# Patient Record
Sex: Male | Born: 1935 | Race: White | Hispanic: No | Smoking: Never smoker
Health system: Southern US, Community
[De-identification: ages and names within clinical notes are randomized; demographics above are authoritative.]

## PROBLEM LIST (undated history)

## (undated) DIAGNOSIS — E079 Disorder of thyroid, unspecified: Secondary | ICD-10-CM

## (undated) DIAGNOSIS — E119 Type 2 diabetes mellitus without complications: Secondary | ICD-10-CM

## (undated) DIAGNOSIS — I10 Essential (primary) hypertension: Secondary | ICD-10-CM

## (undated) HISTORY — PX: JOINT REPLACEMENT: SHX530

---

## 2020-06-08 ENCOUNTER — Other Ambulatory Visit: Payer: Self-pay | Admitting: Otolaryngology

## 2020-06-08 DIAGNOSIS — R29810 Facial weakness: Secondary | ICD-10-CM

## 2020-06-15 ENCOUNTER — Ambulatory Visit
Admission: RE | Admit: 2020-06-15 | Discharge: 2020-06-15 | Disposition: A | Discharge status: Home / Self Care | Payer: Self-pay | Source: Ambulatory Visit | Attending: Otolaryngology | Admitting: Otolaryngology

## 2020-06-15 DIAGNOSIS — R29810 Facial weakness: Secondary | ICD-10-CM

## 2020-06-15 IMAGING — CT CT TEMPORAL BONES W/ CM
2 of 7 series · 8 of 40 positions shown, 10 images · IV contrast (iopamidol)
Comparison: No pertinent prior exam.
COMPARISON: No pertinent prior exam.

Addendum:
CLINICAL DATA: Bilateral hearing loss and drainage from the ear
infection for several months. Multiple rounds of antibiotics. Right
facial paralysis.

EXAM:
CT TEMPORAL BONES WITH CONTRAST
TECHNIQUE: Axial and coronal plane CT imaging of the petrous temporal bones was
performed with thin-collimation image reconstruction following
intravenous contrast administration. Multiplanar CT image
reconstructions were also generated.
CONTRAST:  75mL [HY] IOPAMIDOL ([HY]) INJECTION 61%

[Series 5: temporal bones 0.80 hr60 cor soft · coronal · 0.16mm/px · 3 of 350 slices shown]
[im 117/350  bone]
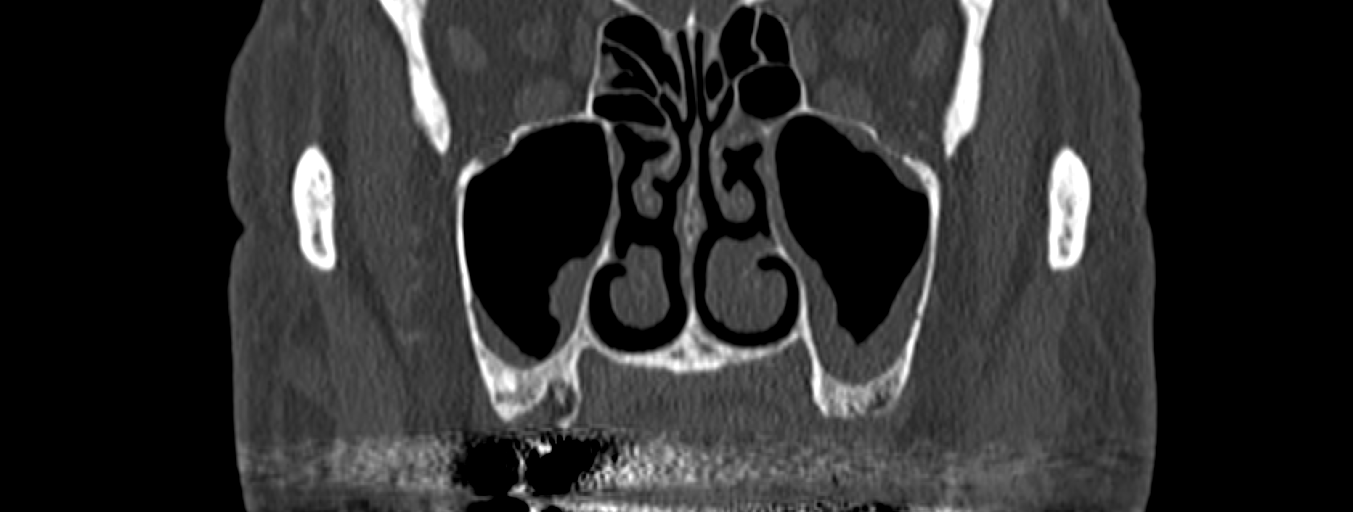
[im 156/350  bone]
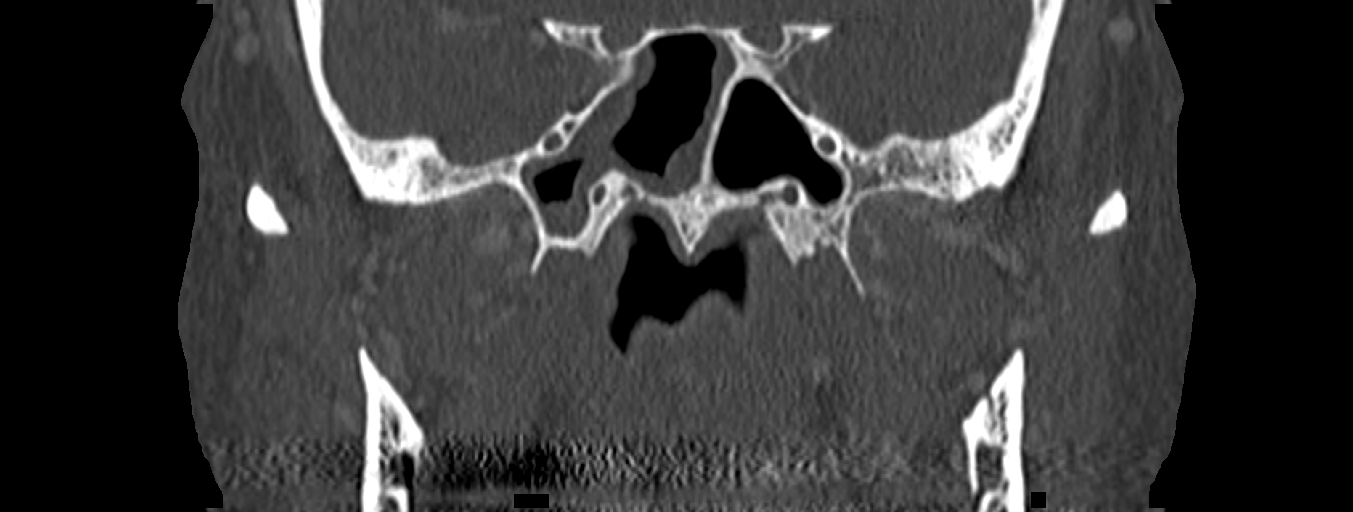
[im 194/350  bone]
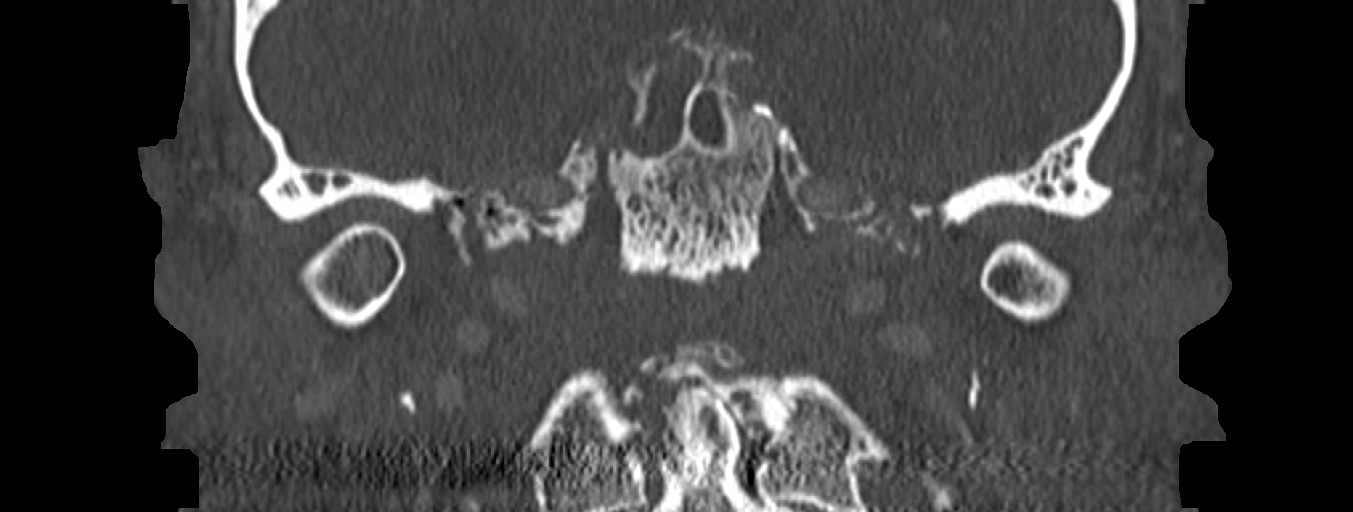

[Series 11: temporal bones 0.80 hr60 cor rt mag · axial · 0.25mm/px · z∈[-600,-535]mm · 5 of 162 slices shown, 7 images]
[im 27/162  brain]
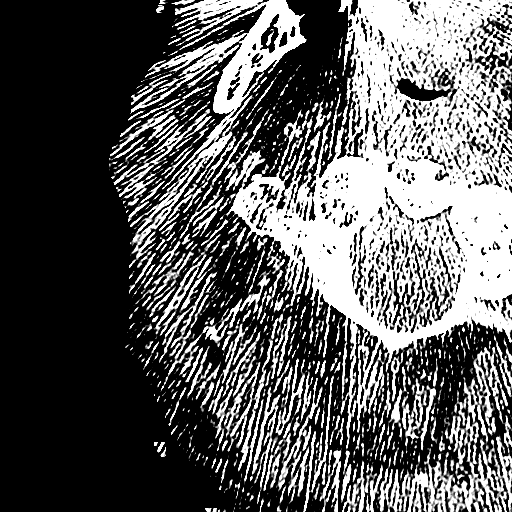
[im 27/162  bone]
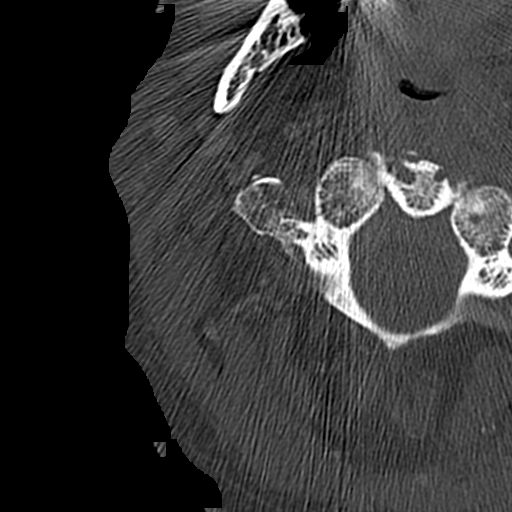
[im 54/162  bone]
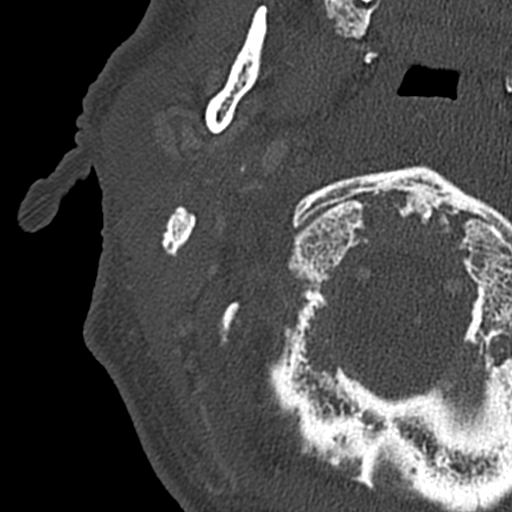
[im 81/162  bone]
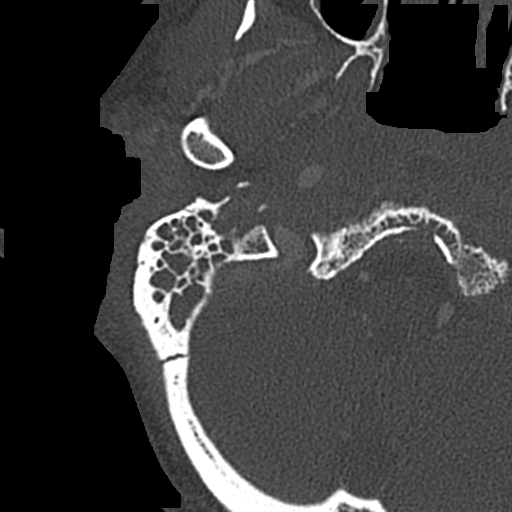
[im 108/162  bone]
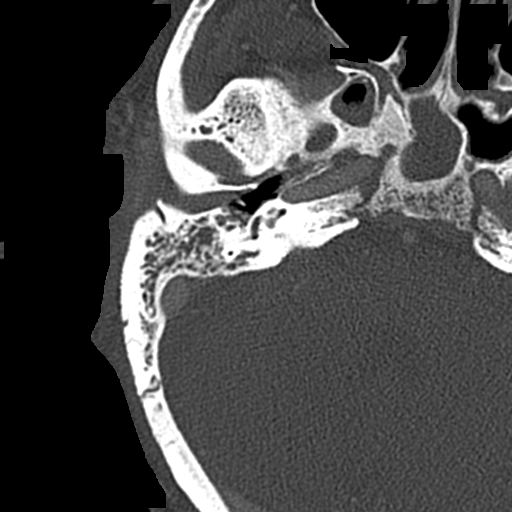
[im 135/162  brain]
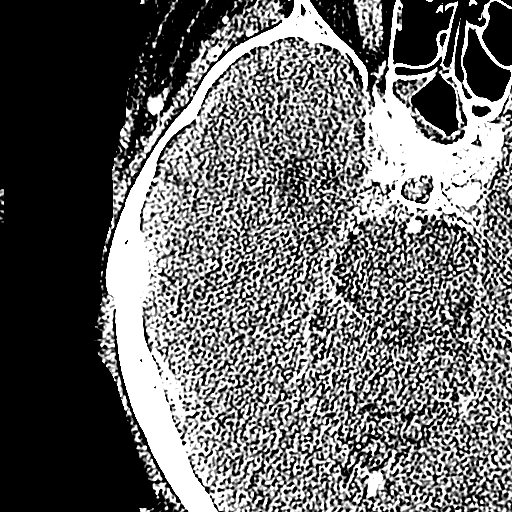
[im 135/162  bone]
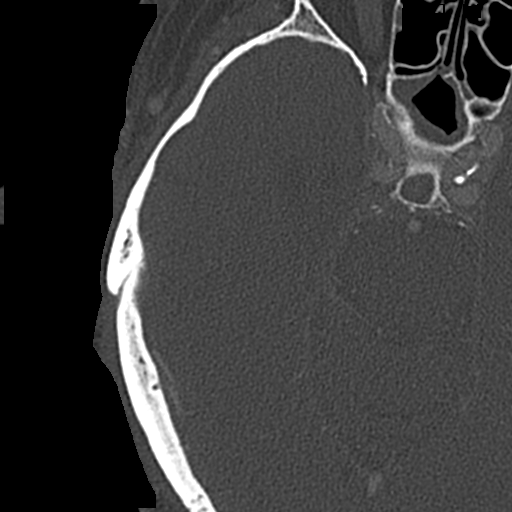

[8 of 40 positions shown; findings below may reference images not displayed]

FINDINGS: Large bilateral mastoid effusions with bilateral middle ear fluid.
There are multifocal areas of bony erosion involving the temporal
bones bilaterally including the jugular foramen region, sigmoid
plates, the region of the stylomastoid foramen bilaterally, along
the clivus, and likely the tegmen tympani bilaterally. There is
extensive surrounding soft tissue thickening and fluid which extends
anteriorly and inferiorly to involve bilateral posterior and lateral
nasopharynx and along the clivus with approximately 2.6 x 0.6 cm
per-clival fluid collection. Abnormal soft tissue also involves
bilateral carotid spaces and the jugular foramen with encasement of
the internal carotid arteries at the skull base and the jugular
veins with associated narrowing. The distal sigmoid sinuses
bilaterally are non-opacified, concerning for thrombosis. The more
proximal transverse sinuses are patent. Soft tissue thickening of
bilateral external auditory canals. The ossicles appear intact
bilaterally. Unremarkable appearance of the inner ear structures. No
obvious intracranial parenchymal abnormality on limited assessment.

On-call ENT provider paged at the time of dictation. Awaiting
callback.
IMPRESSION: 1. Large bilateral mastoid effusions and middle ear fluid with
osseous erosive change of the temporal bones and skull base with
extensive masslike soft tissue thickening involving the posterior
nasopharynx and skull base with encasement of the ICAs and jugular
veins and preclival 2.6 cm fluid collection. Constellation of
findings is concerning for severe bilateral otomastoiditis with
associated skull base osteomyelitis (bacterial or fungal) with
phlegmon and possible preclival abscess. Nasopharyngeal carcinoma is
possible, although the extent of abnormality is atypical. Recommend
biopsy. An MRI with contrast could further characterize the extent
of abnormality and better evaluate for intracranial spread of
infection if clinically indicated.
2. Likely thrombosis of the distal sigmoid dural venous sinuses
bilaterally with narrowing of the internal jugular veins at the
skull base by phlegmon. MRI with contrast could better evaluate the
dural venous sinuses as well.

ADDENDUM:
Additionally, there is suspected intracranial extension of phlegmon
with soft tissue noted along the inferior petrous temporal bones
bilaterally and possibly the dorsal aspect of the clivus.

Findings in the report and this addendum discussed with SAMUKA
via telephone at [DATE] p.m.

*** End of Addendum ***
FINDINGS: Large bilateral mastoid effusions with bilateral middle ear fluid.
There are multifocal areas of bony erosion involving the temporal
bones bilaterally including the jugular foramen region, sigmoid
plates, the region of the stylomastoid foramen bilaterally, along
the clivus, and likely the tegmen tympani bilaterally. There is
extensive surrounding soft tissue thickening and fluid which extends
anteriorly and inferiorly to involve bilateral posterior and lateral
nasopharynx and along the clivus with approximately 2.6 x 0.6 cm
per-clival fluid collection. Abnormal soft tissue also involves
bilateral carotid spaces and the jugular foramen with encasement of
the internal carotid arteries at the skull base and the jugular
veins with associated narrowing. The distal sigmoid sinuses
bilaterally are non-opacified, concerning for thrombosis. The more
proximal transverse sinuses are patent. Soft tissue thickening of
bilateral external auditory canals. The ossicles appear intact
bilaterally. Unremarkable appearance of the inner ear structures. No
obvious intracranial parenchymal abnormality on limited assessment.

On-call ENT provider paged at the time of dictation. Awaiting
callback.
IMPRESSION: 1. Large bilateral mastoid effusions and middle ear fluid with
osseous erosive change of the temporal bones and skull base with
extensive masslike soft tissue thickening involving the posterior
nasopharynx and skull base with encasement of the ICAs and jugular
veins and preclival 2.6 cm fluid collection. Constellation of
findings is concerning for severe bilateral otomastoiditis with
associated skull base osteomyelitis (bacterial or fungal) with
phlegmon and possible preclival abscess. Nasopharyngeal carcinoma is
possible, although the extent of abnormality is atypical. Recommend
biopsy. An MRI with contrast could further characterize the extent
of abnormality and better evaluate for intracranial spread of
infection if clinically indicated.
2. Likely thrombosis of the distal sigmoid dural venous sinuses
bilaterally with narrowing of the internal jugular veins at the
skull base by phlegmon. MRI with contrast could better evaluate the
dural venous sinuses as well.

## 2020-06-15 MED ORDER — IOPAMIDOL (ISOVUE-300) INJECTION 61%
75.0000 mL | Freq: Once | INTRAVENOUS | Status: AC | PRN
  Administered 2020-06-15: 75 mL via INTRAVENOUS

## 2020-06-18 ENCOUNTER — Inpatient Hospital Stay (HOSPITAL_COMMUNITY)
Admission: EM | Admit: 2020-06-18 | Discharge: 2020-06-24 | DRG: 638 | Disposition: A | Discharge status: Home Health | Payer: Medicare Other | Attending: Internal Medicine | Admitting: Internal Medicine

## 2020-06-18 ENCOUNTER — Encounter (HOSPITAL_COMMUNITY): Payer: Self-pay | Admitting: Emergency Medicine

## 2020-06-18 ENCOUNTER — Other Ambulatory Visit: Payer: Self-pay

## 2020-06-18 DIAGNOSIS — I10 Essential (primary) hypertension: Secondary | ICD-10-CM | POA: Diagnosis present

## 2020-06-18 DIAGNOSIS — R0902 Hypoxemia: Secondary | ICD-10-CM

## 2020-06-18 DIAGNOSIS — G510 Bell's palsy: Secondary | ICD-10-CM | POA: Diagnosis present

## 2020-06-18 DIAGNOSIS — D649 Anemia, unspecified: Secondary | ICD-10-CM

## 2020-06-18 DIAGNOSIS — B965 Pseudomonas (aeruginosa) (mallei) (pseudomallei) as the cause of diseases classified elsewhere: Secondary | ICD-10-CM | POA: Diagnosis present

## 2020-06-18 DIAGNOSIS — A498 Other bacterial infections of unspecified site: Secondary | ICD-10-CM

## 2020-06-18 DIAGNOSIS — Z96653 Presence of artificial knee joint, bilateral: Secondary | ICD-10-CM | POA: Diagnosis present

## 2020-06-18 DIAGNOSIS — Z79899 Other long term (current) drug therapy: Secondary | ICD-10-CM

## 2020-06-18 DIAGNOSIS — M542 Cervicalgia: Secondary | ICD-10-CM | POA: Diagnosis not present

## 2020-06-18 DIAGNOSIS — G08 Intracranial and intraspinal phlebitis and thrombophlebitis: Secondary | ICD-10-CM | POA: Diagnosis present

## 2020-06-18 DIAGNOSIS — Z96643 Presence of artificial hip joint, bilateral: Secondary | ICD-10-CM | POA: Diagnosis present

## 2020-06-18 DIAGNOSIS — Z7982 Long term (current) use of aspirin: Secondary | ICD-10-CM | POA: Diagnosis not present

## 2020-06-18 DIAGNOSIS — E039 Hypothyroidism, unspecified: Secondary | ICD-10-CM | POA: Diagnosis present

## 2020-06-18 DIAGNOSIS — M1990 Unspecified osteoarthritis, unspecified site: Secondary | ICD-10-CM | POA: Diagnosis present

## 2020-06-18 DIAGNOSIS — Z974 Presence of external hearing-aid: Secondary | ICD-10-CM

## 2020-06-18 DIAGNOSIS — Z7189 Other specified counseling: Secondary | ICD-10-CM | POA: Diagnosis not present

## 2020-06-18 DIAGNOSIS — Z79890 Hormone replacement therapy (postmenopausal): Secondary | ICD-10-CM

## 2020-06-18 DIAGNOSIS — I160 Hypertensive urgency: Secondary | ICD-10-CM

## 2020-06-18 DIAGNOSIS — H7093 Unspecified mastoiditis, bilateral: Secondary | ICD-10-CM | POA: Diagnosis not present

## 2020-06-18 DIAGNOSIS — R627 Adult failure to thrive: Secondary | ICD-10-CM | POA: Diagnosis present

## 2020-06-18 DIAGNOSIS — M869 Osteomyelitis, unspecified: Secondary | ICD-10-CM

## 2020-06-18 DIAGNOSIS — Z7984 Long term (current) use of oral hypoglycemic drugs: Secondary | ICD-10-CM

## 2020-06-18 DIAGNOSIS — M8668 Other chronic osteomyelitis, other site: Secondary | ICD-10-CM | POA: Diagnosis present

## 2020-06-18 DIAGNOSIS — M009 Pyogenic arthritis, unspecified: Secondary | ICD-10-CM | POA: Diagnosis present

## 2020-06-18 DIAGNOSIS — E222 Syndrome of inappropriate secretion of antidiuretic hormone: Secondary | ICD-10-CM | POA: Diagnosis present

## 2020-06-18 DIAGNOSIS — H9193 Unspecified hearing loss, bilateral: Secondary | ICD-10-CM | POA: Diagnosis present

## 2020-06-18 DIAGNOSIS — H6063 Unspecified chronic otitis externa, bilateral: Secondary | ICD-10-CM | POA: Diagnosis present

## 2020-06-18 DIAGNOSIS — Z1623 Resistance to quinolones and fluoroquinolones: Secondary | ICD-10-CM | POA: Diagnosis present

## 2020-06-18 DIAGNOSIS — E785 Hyperlipidemia, unspecified: Secondary | ICD-10-CM | POA: Diagnosis present

## 2020-06-18 DIAGNOSIS — Z6822 Body mass index (BMI) 22.0-22.9, adult: Secondary | ICD-10-CM

## 2020-06-18 DIAGNOSIS — H7013 Chronic mastoiditis, bilateral: Secondary | ICD-10-CM | POA: Diagnosis present

## 2020-06-18 DIAGNOSIS — E1169 Type 2 diabetes mellitus with other specified complication: Secondary | ICD-10-CM | POA: Diagnosis present

## 2020-06-18 DIAGNOSIS — D638 Anemia in other chronic diseases classified elsewhere: Secondary | ICD-10-CM | POA: Diagnosis present

## 2020-06-18 DIAGNOSIS — Z515 Encounter for palliative care: Secondary | ICD-10-CM | POA: Diagnosis not present

## 2020-06-18 DIAGNOSIS — G4733 Obstructive sleep apnea (adult) (pediatric): Secondary | ICD-10-CM | POA: Diagnosis present

## 2020-06-18 DIAGNOSIS — H7090 Unspecified mastoiditis, unspecified ear: Secondary | ICD-10-CM | POA: Diagnosis present

## 2020-06-18 DIAGNOSIS — Z20822 Contact with and (suspected) exposure to covid-19: Secondary | ICD-10-CM | POA: Diagnosis present

## 2020-06-18 DIAGNOSIS — Z794 Long term (current) use of insulin: Secondary | ICD-10-CM

## 2020-06-18 DIAGNOSIS — E119 Type 2 diabetes mellitus without complications: Secondary | ICD-10-CM

## 2020-06-18 HISTORY — DX: Disorder of thyroid, unspecified: E07.9

## 2020-06-18 HISTORY — DX: Type 2 diabetes mellitus without complications: E11.9

## 2020-06-18 HISTORY — DX: Essential (primary) hypertension: I10

## 2020-06-18 LAB — COMPREHENSIVE METABOLIC PANEL
ALT: 14 U/L (ref 0–44)
AST: 16 U/L (ref 15–41)
Albumin: 2.3 g/dL — ABNORMAL LOW (ref 3.5–5.0)
Alkaline Phosphatase: 75 U/L (ref 38–126)
Anion gap: 7 (ref 5–15)
BUN: 19 mg/dL (ref 8–23)
CO2: 31 mmol/L (ref 22–32)
Calcium: 8.5 mg/dL — ABNORMAL LOW (ref 8.9–10.3)
Chloride: 91 mmol/L — ABNORMAL LOW (ref 98–111)
Creatinine, Ser: 0.79 mg/dL (ref 0.61–1.24)
GFR, Estimated: >60 mL/min (ref >60)
Glucose, Bld: 204 mg/dL — ABNORMAL HIGH (ref 70–99)
Potassium: 4.2 mmol/L (ref 3.5–5.1)
Sodium: 129 mmol/L — ABNORMAL LOW (ref 135–145)
Total Bilirubin: 0.6 mg/dL (ref 0.3–1.2)
Total Protein: 5.8 g/dL — ABNORMAL LOW (ref 6.5–8.1)

## 2020-06-18 LAB — CBG MONITORING, ED: Glucose-Capillary: 174 mg/dL — ABNORMAL HIGH (ref 70–99)

## 2020-06-18 LAB — CBC WITH DIFFERENTIAL/PLATELET
Abs Immature Granulocytes: 0.04 10*3/uL (ref 0.00–0.07)
Basophils Absolute: 0 10*3/uL (ref 0.0–0.1)
Basophils Relative: 0 %
Eosinophils Absolute: 0.3 10*3/uL (ref 0.0–0.5)
Eosinophils Relative: 3 %
HCT: 31.4 % — ABNORMAL LOW (ref 39.0–52.0)
Hemoglobin: 10.4 g/dL — ABNORMAL LOW (ref 13.0–17.0)
Immature Granulocytes: 1 %
Lymphocytes Relative: 11 %
Lymphs Abs: 0.9 10*3/uL (ref 0.7–4.0)
MCH: 29.9 pg (ref 26.0–34.0)
MCHC: 33.1 g/dL (ref 30.0–36.0)
MCV: 90.2 fL (ref 80.0–100.0)
Monocytes Absolute: 0.6 10*3/uL (ref 0.1–1.0)
Monocytes Relative: 7 %
Neutro Abs: 6 10*3/uL (ref 1.7–7.7)
Neutrophils Relative %: 78 %
Platelets: 484 10*3/uL — ABNORMAL HIGH (ref 150–400)
RBC: 3.48 MIL/uL — ABNORMAL LOW (ref 4.22–5.81)
RDW: 13.4 % (ref 11.5–15.5)
WBC: 7.7 10*3/uL (ref 4.0–10.5)
nRBC: 0 % (ref 0.0–0.2)

## 2020-06-18 LAB — RESP PANEL BY RT-PCR (FLU A&B, COVID) ARPGX2
Influenza A by PCR: NEGATIVE
Influenza B by PCR: NEGATIVE
SARS Coronavirus 2 by RT PCR: NEGATIVE

## 2020-06-18 LAB — LACTIC ACID, PLASMA: Lactic Acid, Venous: 1.1 mmol/L (ref 0.5–1.9)

## 2020-06-18 MED ORDER — SODIUM CHLORIDE 0.9 % IV SOLN
2.0000 g | Freq: Three times a day (TID) | INTRAVENOUS | Status: DC
  Administered 2020-06-18 – 2020-06-24 (×16): 2 g via INTRAVENOUS
  Filled 2020-06-18 (×17): qty 2

## 2020-06-18 NOTE — ED Provider Notes (Signed)
Emergency Medicine Provider Triage Evaluation Note  Nicolas James , a 87 y.o. male  was evaluated in triage.  Pt complains of osteomyelitis. Hx provided by daughter at bedside. She states pt has had chronic otitis externa. He recently f/u with ent here in [place] and had ct temporal bones which showed otomastoiditis with skull base osteomyelitis and possible preclival abscess. He also likely has thrombosis of the distal sigmoid dural venous sinuses bilaterally.    Review of Systems  Positive: Facial droop (chronic, unchanged), ear drainage Negative: fever  Physical Exam  BP (!) 152/76 (BP Location: Right Arm)   Pulse (!) 59   Temp 98 F (36.7 C)   Resp 14   SpO2 100%  Gen:   Awake, no distress  Resp:  Normal effort  MSK:   Moves extremities without difficulty    Medical Decision Making  Medically screening exam initiated at 8:43 PM.  Appropriate orders placed.  Nicolas James was informed that the remainder of the evaluation will be completed by another provider, this initial triage assessment does not replace that evaluation, and the importance of remaining in the ED until their evaluation is complete.     Nicolas James 06/18/20 2048    Tegeler, Canary Brim, MD 06/18/20 2258

## 2020-06-18 NOTE — ED Provider Notes (Signed)
[facility] EMERGENCY DEPARTMENT Provider Note   CSN: [id number] Arrival date & time: 06/18/20  2019     History Chief Complaint  Patient presents with  . Osteomyelitis     Graylen Noboa is a 87 y.o. male.  HPI   Patient has a history of chronic otitis externa that has been ongoing since August of last year.  Patient has been having copious drainage from his ears.  He has had worsening symptoms that has resulted in loss of hearing.  He also has developed a facial palsy.  Patient has been followed by [facility].  He had a CT scan on April 27 of the temporal bones.  This was performed with contrast.  The findings showed large bilateral mastoid effusions and middle ear fluid with erosive changes of the temporal bones and skull base.  There was extensive masslike soft tissue thickening involving the posterior nasopharynx and skull base with encasement of the internal carotid arteries and jugular veins.  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 Diabetes mellitus without complication (HCC)   . Hypertension   . Thyroid disease     There are no problems to display for this patient.   Past Surgical History:  Procedure Laterality Date  . JOINT REPLACEMENT Bilateral    Hips and Knees       No family history on file.  Social History   Tobacco Use  . Smoking status: Never Smoker  . Smokeless tobacco: Never Used  Substance Use Topics  . Alcohol use: Never  . Drug use: Never    Home Medications Prior to Admission medications   Not on File    Allergies    Patient has no known allergies.  Review of Systems   Review of Systems  All other systems reviewed and are negative.   Physical Exam Updated Vital Signs BP (!) 179/80   Pulse (!) 57   Temp 98 F (36.7 C)   Resp 17   SpO2 100%   Physical Exam Vitals and nursing note reviewed.   Constitutional:      Appearance: He is well-developed. He is not diaphoretic.  HENT:     Head: Normocephalic and atraumatic.     Ears:     Comments: Cotton swabs bilateral external auditory canals Eyes:     General: No scleral icterus.       Right eye: No discharge.        Left eye: No discharge.     Conjunctiva/sclera: Conjunctivae normal.  Neck:     Trachea: No tracheal deviation.  Cardiovascular:     Rate and Rhythm: Normal rate and regular rhythm.  Pulmonary:     Effort: Pulmonary effort is normal. No respiratory distress.     Breath sounds: Normal breath sounds. No stridor. No wheezing or rales.  Abdominal:     General: Bowel sounds are normal. There is no distension.     Palpations: Abdomen is soft.     Tenderness: There is no abdominal tenderness. There is no guarding or rebound.  Musculoskeletal:        General: No tenderness.     Cervical back: Neck supple.  Skin:    General: Skin is warm and dry.     Findings: No rash.  Neurological:     Mental Status: He is alert.     Cranial Nerves: No cranial nerve deficit (no facial droop, extraocular movements  intact, no slurred speech).     Sensory: No sensory deficit.     Motor: No abnormal muscle tone or seizure activity.     Coordination: Coordination normal.     Comments: Right facial palsy     ED Results / Procedures / Treatments   Labs (all labs ordered are listed, but only abnormal results are displayed) Labs Reviewed  COMPREHENSIVE METABOLIC PANEL - Abnormal; Notable for the following components:      Result Value   Sodium 129 (*)    Chloride 91 (*)    Glucose, Bld 204 (*)    Calcium 8.5 (*)    Total Protein 5.8 (*)    Albumin 2.3 (*)    All other components within normal limits  CBC WITH DIFFERENTIAL/PLATELET - Abnormal; Notable for the following components:   RBC 3.48 (*)    Hemoglobin 10.4 (*)    HCT 31.4 (*)    Platelets 484 (*)    All other components within normal limits  CBG MONITORING, ED -  Abnormal; Notable for the following components:   Glucose-Capillary 174 (*)    All other components within normal limits  CULTURE, BLOOD (ROUTINE X 2)  CULTURE, BLOOD (ROUTINE X 2)  RESP PANEL BY RT-PCR (FLU A&B, COVID) ARPGX2  LACTIC ACID, PLASMA    EKG EKG Interpretation  Date/Time:  Monday Jun 18 2020 22:21:41 EDT Ventricular Rate:  55 PR Interval:  188 QRS Duration: 103 QT Interval:  500 QTC Calculation: 479 R Axis:   52 Text Interpretation: Sinus rhythm Borderline T wave abnormalities Borderline prolonged QT interval No old tracing to compare Confirmed by Linwood Dibbles [phone]) on 06/18/2020 10:43:44 PM   Radiology No results found.  Procedures Procedures   Medications Ordered in ED Medications  ceFEPIme (MAXIPIME) 2 g in sodium chloride 0.9 % 100 mL IVPB (2 g Intravenous New Bag/Given 06/18/20 2246)    ED Course  I have reviewed the triage vital signs and the nursing notes.  Pertinent labs & imaging results that were available during my care of the patient were reviewed by me and considered in my medical decision making (see chart for details).  Clinical Course as of 06/18/20 2250  Mon Jun 18, 2020  2235 Case discussed with Dr Marene Lenz.  Will consult on patient.  Recommend admission for IV abx.  [JK]  2235 DIscussed with Dr Ilsa Iha.  Will start on cefipime [JK]  2250 Labs reviewed.  Normal CBC.  Lactic acid level not elevated. [JK]  2250   Metabolic panel notable for mild hyponatremia [JK]    Clinical Course User Index [JK] Linwood Dibbles, MD   MDM Rules/Calculators/A&P                          Patient presents to the ED for further treatment of recent abnormal temporal bone CT.  Patient has bilateral mastoid effusions as well as erosive changes in the temporal bone and skull.  Patient has masslike soft tissue thickening involving the posterior nasopharynx and skull base with encasement of the internal carotid arteries and jugular veins as well as fluid collections.   Patient has evidence of phlegmon and possible precarinal abscess.  I discussed with Dr. Darl Pikes.   She will be consulting with Dr. Annalee Genta who initially saw the patient.  They will plan on biopsy at some point.  Discussed case with Dr. Marcha Dutton infectious disease and she recommends cefepime right now.  I will consult the medical service  for admission to continue IV antibiotic treatment  Final Clinical Impression(s) / ED Diagnoses Final diagnoses:  Osteomyelitis, unspecified site, unspecified type (HCC)  Mastoiditis, unspecified laterality      Linwood Dibbles, MD 06/18/20 2250

## 2020-06-18 NOTE — ED Triage Notes (Signed)
Patient with chronic ear infections bilaterally.  Patient does have facial paralysis and droop on the right. Patient has ear and eye drainage.  He had a CT on Friday which stated he had possible mass at back of nasal passages.  Equal grips and equal pedal pushes.  Patient having dizziness.

## 2020-06-18 NOTE — Progress Notes (Signed)
Pharmacy Antibiotic Note  Nicolas James is a 87 y.o. male admitted on 06/18/2020 presenting with otomastoiditis with osteomyelitis and possible abscess.  Pharmacy has been consulted for cefepime dosing.  Plan: Cefepime 2g IV every 8 hours Monitor renal function, clinical progression and ID recs     Temp (24hrs), Avg:98 F (36.7 C), Min:98 F (36.7 C), Max:98 F (36.7 C)  Recent Labs  Lab 06/18/20 2047 06/18/20 2147  WBC 7.7  --   CREATININE 0.79  --   LATICACIDVEN  --  1.1    CrCl cannot be calculated (Unknown ideal weight.).    No Known Allergies  Daylene Posey, PharmD Clinical Pharmacist ED Pharmacist Phone # [phone] 06/18/2020 10:33 PM

## 2020-06-19 ENCOUNTER — Inpatient Hospital Stay (HOSPITAL_COMMUNITY): Payer: Medicare Other

## 2020-06-19 ENCOUNTER — Encounter (HOSPITAL_COMMUNITY): Payer: Self-pay | Admitting: Internal Medicine

## 2020-06-19 DIAGNOSIS — D649 Anemia, unspecified: Secondary | ICD-10-CM

## 2020-06-19 DIAGNOSIS — A498 Other bacterial infections of unspecified site: Secondary | ICD-10-CM

## 2020-06-19 DIAGNOSIS — I160 Hypertensive urgency: Secondary | ICD-10-CM

## 2020-06-19 DIAGNOSIS — H7093 Unspecified mastoiditis, bilateral: Secondary | ICD-10-CM

## 2020-06-19 DIAGNOSIS — E119 Type 2 diabetes mellitus without complications: Secondary | ICD-10-CM

## 2020-06-19 DIAGNOSIS — M869 Osteomyelitis, unspecified: Secondary | ICD-10-CM

## 2020-06-19 LAB — BASIC METABOLIC PANEL
Anion gap: 12 (ref 5–15)
BUN: 17 mg/dL (ref 8–23)
CO2: 25 mmol/L (ref 22–32)
Calcium: 8.4 mg/dL — ABNORMAL LOW (ref 8.9–10.3)
Chloride: 91 mmol/L — ABNORMAL LOW (ref 98–111)
Creatinine, Ser: 0.7 mg/dL (ref 0.61–1.24)
GFR, Estimated: >60 mL/min (ref >60)
Glucose, Bld: 223 mg/dL — ABNORMAL HIGH (ref 70–99)
Potassium: 3.9 mmol/L (ref 3.5–5.1)
Sodium: 128 mmol/L — ABNORMAL LOW (ref 135–145)

## 2020-06-19 LAB — GLUCOSE, CAPILLARY
Glucose-Capillary: 99 mg/dL (ref 70–99)
Glucose-Capillary: 148 mg/dL — ABNORMAL HIGH (ref 70–99)
Glucose-Capillary: 122 mg/dL — ABNORMAL HIGH (ref 70–99)
Glucose-Capillary: 107 mg/dL — ABNORMAL HIGH (ref 70–99)

## 2020-06-19 LAB — CBC
HCT: 35.2 % — ABNORMAL LOW (ref 39.0–52.0)
Hemoglobin: 11.2 g/dL — ABNORMAL LOW (ref 13.0–17.0)
MCH: 29.3 pg (ref 26.0–34.0)
MCHC: 31.8 g/dL (ref 30.0–36.0)
MCV: 92.1 fL (ref 80.0–100.0)
Platelets: 489 10*3/uL — ABNORMAL HIGH (ref 150–400)
RBC: 3.82 MIL/uL — ABNORMAL LOW (ref 4.22–5.81)
RDW: 13.4 % (ref 11.5–15.5)
WBC: 8.9 10*3/uL (ref 4.0–10.5)
nRBC: 0 % (ref 0.0–0.2)

## 2020-06-19 LAB — D-DIMER, QUANTITATIVE: D-Dimer, Quant: 2.56 ug/mL-FEU — ABNORMAL HIGH (ref 0.00–0.50)

## 2020-06-19 LAB — FOLATE: Folate: 22 ng/mL (ref >5.9)

## 2020-06-19 LAB — CBG MONITORING, ED
Glucose-Capillary: 224 mg/dL — ABNORMAL HIGH (ref 70–99)
Glucose-Capillary: 165 mg/dL — ABNORMAL HIGH (ref 70–99)

## 2020-06-19 LAB — IRON AND TIBC
Iron: 35 ug/dL — ABNORMAL LOW (ref 45–182)
Saturation Ratios: 14 % — ABNORMAL LOW (ref 17.9–39.5)
TIBC: 253 ug/dL (ref 250–450)
UIBC: 218 ug/dL

## 2020-06-19 LAB — SEDIMENTATION RATE: Sed Rate: 73 mm/hr — ABNORMAL HIGH (ref 0–16)

## 2020-06-19 LAB — RETICULOCYTES
Immature Retic Fract: 26.7 % — ABNORMAL HIGH (ref 2.3–15.9)
RBC.: 3.79 MIL/uL — ABNORMAL LOW (ref 4.22–5.81)
Retic Count, Absolute: 111.4 10*3/uL (ref 19.0–186.0)
Retic Ct Pct: 2.9 % (ref 0.4–3.1)

## 2020-06-19 LAB — MAGNESIUM: Magnesium: 1.4 mg/dL — ABNORMAL LOW (ref 1.7–2.4)

## 2020-06-19 LAB — FERRITIN: Ferritin: 201 ng/mL (ref 24–336)

## 2020-06-19 LAB — VITAMIN B12: Vitamin B-12: 422 pg/mL (ref 180–914)

## 2020-06-19 LAB — C-REACTIVE PROTEIN: CRP: 6.4 mg/dL — ABNORMAL HIGH (ref <1.0)

## 2020-06-19 IMAGING — MR MR HEAD WO/W CM
12 of 24 series · 26 of 48 positions shown · IV contrast (Gadavist)
Comparison: Recent temporal bone CT from [DATE].

CLINICAL DATA: Initial evaluation for acute otitis media with skull
base infection.

EXAM:
MRI HEAD WITHOUT AND WITH CONTRAST
TECHNIQUE: Multiplanar, multiecho pulse sequences of the brain and surrounding
structures were obtained without and with intravenous contrast.
CONTRAST:  8mL GADAVIST GADOBUTROL 1 MMOL/ML IV SOLN

[Series 5: DWI · axial · 3.0mm · 0.88mm/px · z∈[-71,+68]mm · 5 of 96 slices shown (1 of 4)]
[im 1/96]
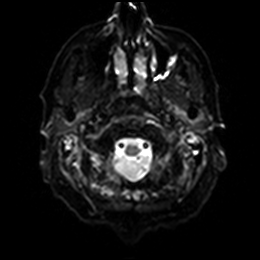
[im 24/96]
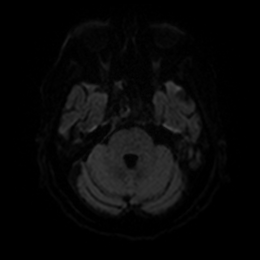
[im 48/96]
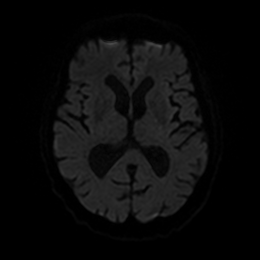
[im 72/96]
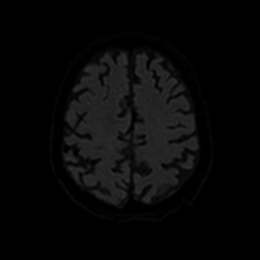
[im 96/96]
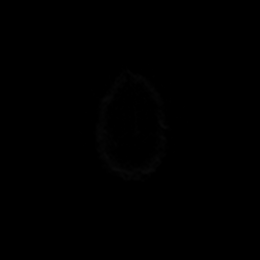

[Series 6: DWI · axial · 3.0mm · 0.88mm/px · z∈[-71,+68]mm · 3 of 48 slices shown (2 of 4)]
[im 1/48]
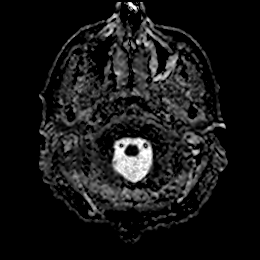
[im 24/48]
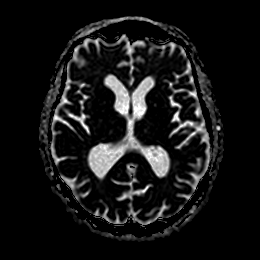
[im 48/48]
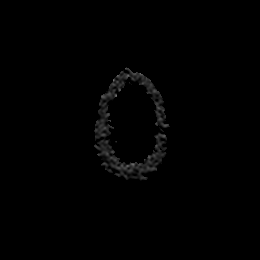

[Series 7: DWI · coronal · 4.0mm · 0.88mm/px · 3 of 64 slices shown (3 of 4)]
[im 1/64]
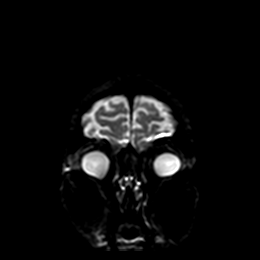
[im 32/64]
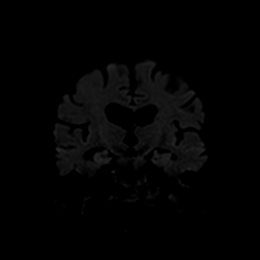
[im 64/64]
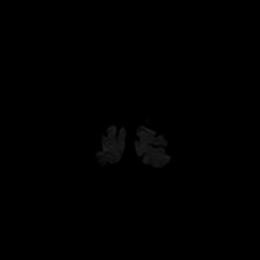

[Series 8: DWI · coronal · 4.0mm · 0.88mm/px · 2 of 32 slices shown (4 of 4)]
[im 1/32]
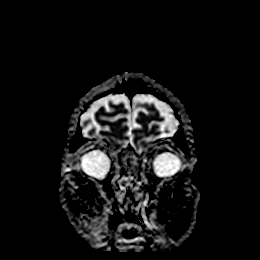
[im 32/32]
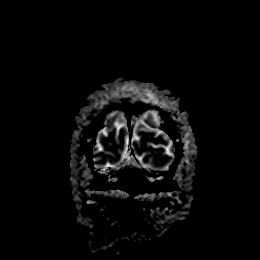

[Series 9: T1 · sagittal · 5.0mm · 0.75mm/px · 1 of 23 slices shown]
[im 1/23]
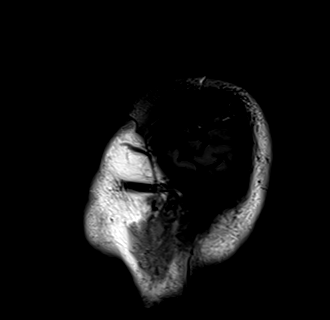

[Series 10: T2 · axial · 5.0mm · 0.72mm/px · 1 of 25 slices shown (1 of 2)]
[im 1/25]
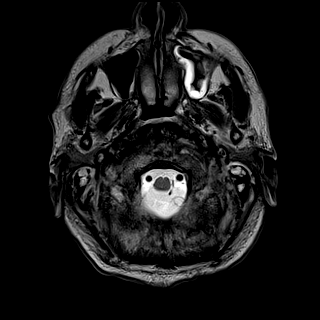

[Series 11: FLAIR · axial · 5.0mm · 0.45mm/px · 1 of 25 slices shown]
[im 1/25]
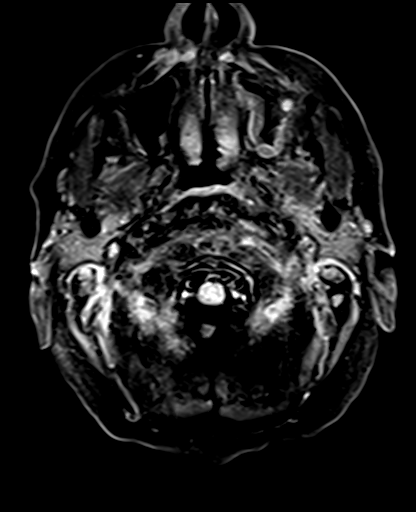

[Series 17: T2 · coronal · 5.0mm · 0.34mm/px · 2 of 29 slices shown (2 of 2)]
[im 1/29]
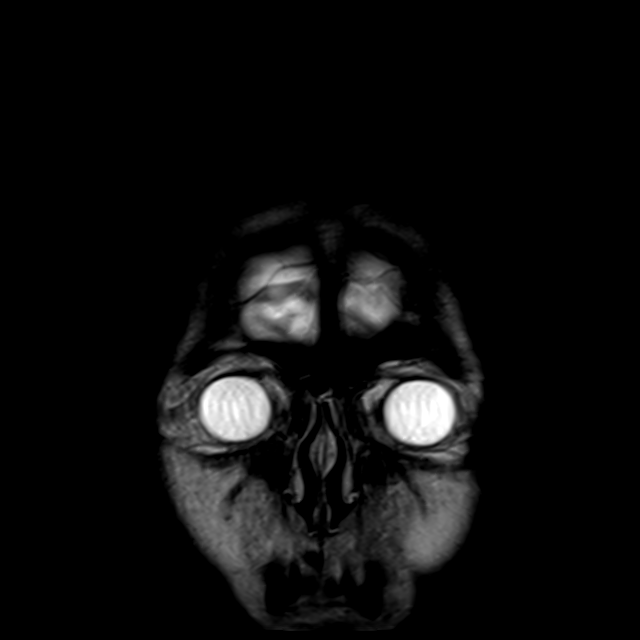
[im 29/29]
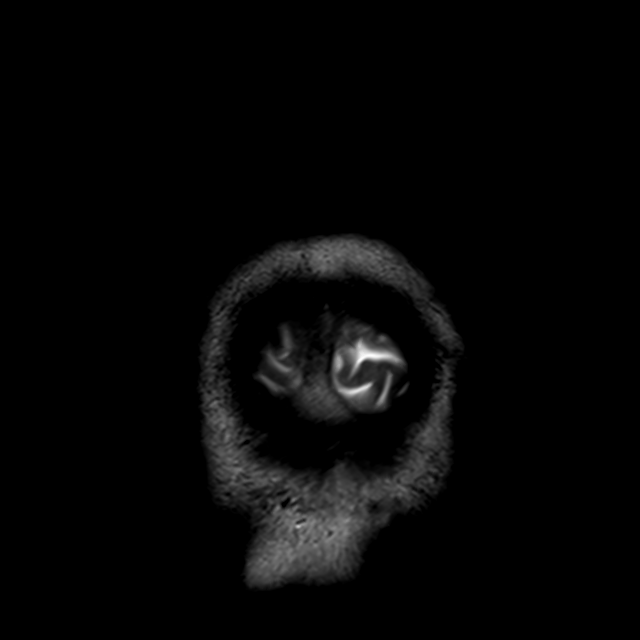

[Series 24: T2 post-contrast · coronal · 5.0mm · 0.72mm/px · 2 of 28 slices shown]
[im 1/28]
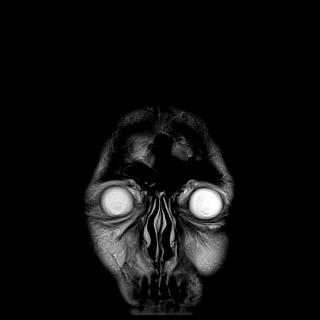
[im 28/28]
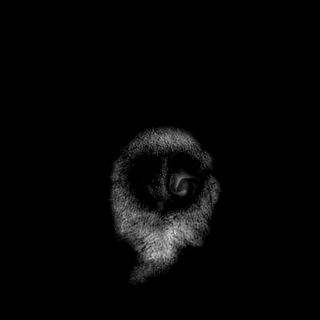

[Series 28: T1 post-contrast · coronal · 5.0mm · 0.34mm/px · 2 of 28 slices shown (1 of 3)]
[im 1/28]
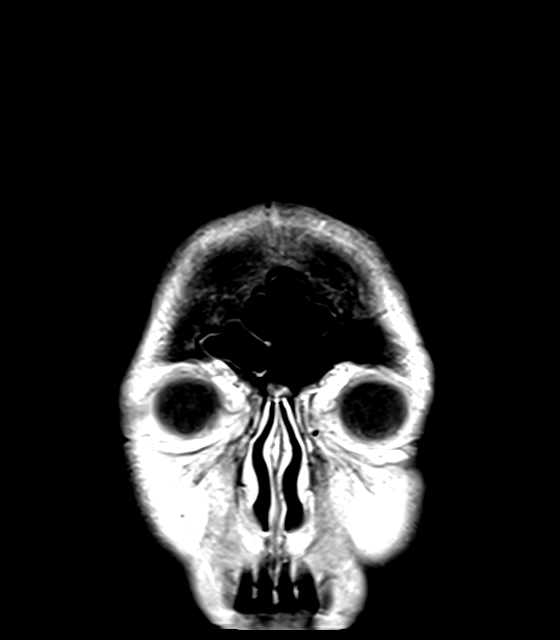
[im 28/28]
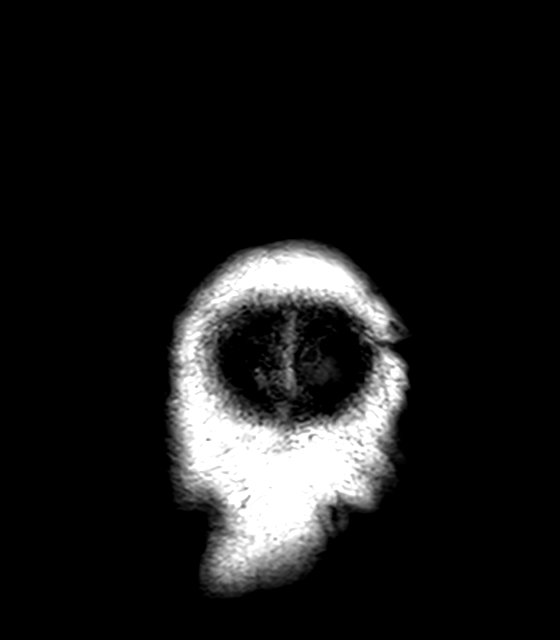

[Series 29: T1 post-contrast · axial · 3.0mm · 0.39mm/px · z∈[-165,-12]mm · 2 of 45 slices shown (2 of 3)]
[im 1/45]
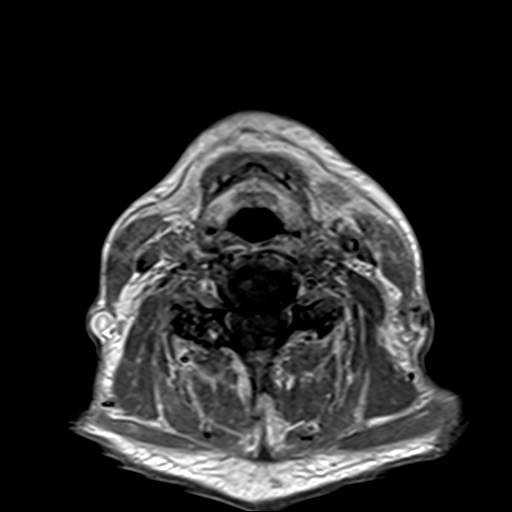
[im 45/45]
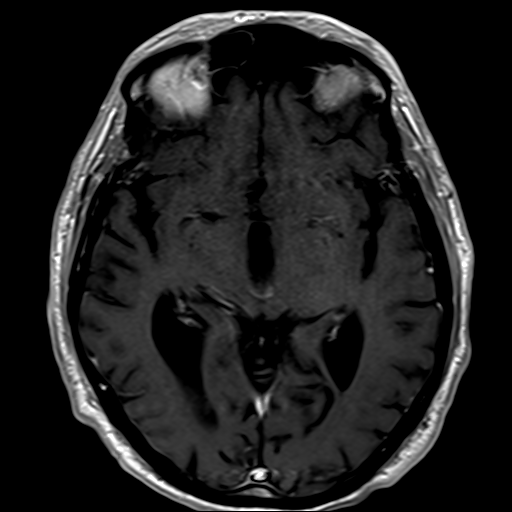

[Series 30: T1 post-contrast · coronal · 5.0mm · 0.34mm/px · 2 of 28 slices shown (3 of 3)]
[im 1/28]
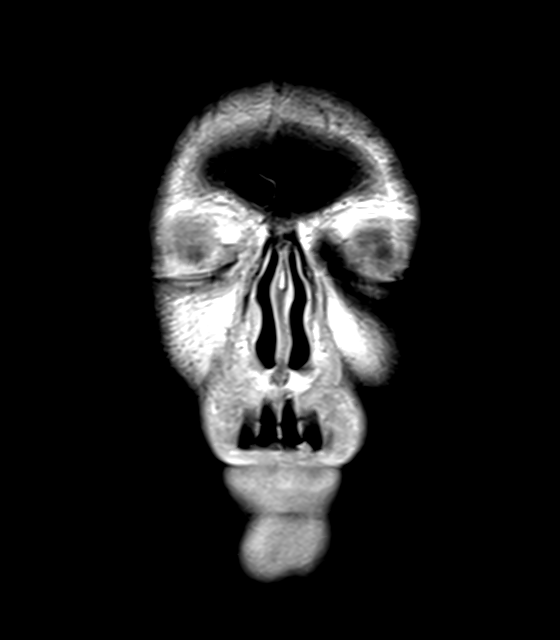
[im 28/28]
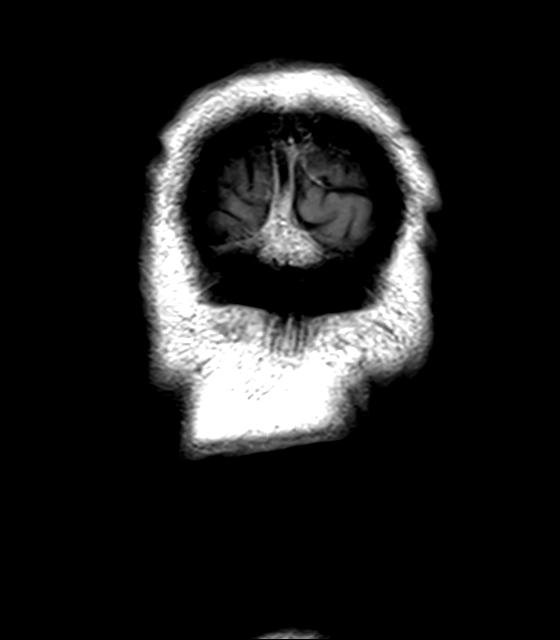

[26 of 48 positions shown; findings below may reference images not displayed]

FINDINGS: Brain: Cerebral volume within normal limits for age. Minimal
scattered T2/FLAIR hyperintensity seen involving the supratentorial
cerebral white matter, likely chronic microvascular ischemic
disease, minimal for age. Probable small remote right cerebellar
infarct noted (series 10, image 5). No evidence for acute or
subacute ischemia. No acute or chronic intracranial hemorrhage. No
mass lesion, mass effect, or midline shift. No hydrocephalus.

Again seen are large bilateral mastoid and middle ear effusions.
Extensive abnormal infiltrative soft tissue density and thickening
seen extending medially to involve the skull base including the
petroclival regions, pre clival nasopharynx, with involvement of the
jugular foramina bilaterally. Associated osseous erosion better
appreciated on prior temporal bone CT. Area demonstrates diffuse
heterogeneous signal abnormality with fairly avid postcontrast
enhancement. Associated soft tissue swelling and thickening involves
the visualized external auditory canals as well, with associated
narrowing of both EACs. Constellation of findings consistent with
extensive bilateral otitis media/otitis externa with associated
infiltrative skull base infection. Heterogeneous marrow signal
intensity within the clivus and petrous portions of both temporal
bones likely reflect associated osteomyelitis.

Pre clival nasopharyngeal collection again seen, measuring 2.4 x
cm (series 25, image 3). Additional probable small fluid collection
seen dissecting medially from the right mastoid air cells just above
the sigmoid sinus and towards the right cerebellar pontine angle
cistern, measuring approximately 6 x 5 mm (series 25, image 4).

There is associated enhancing phlegmonous change along the posterior
aspect of the clivus (series 25, image 3). Associated smooth dural
thickening and enhancement throughout the adjacent skull base and
along the inferior temporal convexities bilaterally, likely
reflecting early intracranial spread of infection (series 26, image
7). This is also seen on axial images as well (series 25, image 8).
No parenchymal edema to suggest frank encephalitis within the brain
itself. No visible evidence for spread of infection into either
internal auditory canal.

No other acute intracranial abnormality.

Vascular: Encasement of the distal cervical ICAs and proximal
internal jugular veins by the infiltrative phlegmonous change
throughout the skull base again noted. Major arterial intracranial
vascular flow voids are maintained without evidence for occlusion or
other complication at this time. The major dural sinuses appear
grossly patent as well, with no visible evidence for dural sinus
thrombosis on this non MRV exam as previously questioned.

Skull and upper cervical spine: Evidence for associated
osteomyelitis involving the petrous temporal bones bilaterally and
clivus. Additional involvement of the left greater than right
occipital condyles as well as the lateral masses of C1 and dens.
Additional diffuse edema seen throughout the soft tissues of the
visualized upper neck (series 23, image 1). No other visible
collections.

Sinuses/Orbits: Patient status post bilateral ocular lens
replacement. Globes and orbital soft tissues demonstrate no other
acute finding. Evidence for concomitant/associated right sphenoid
sinusitis. Additional mild mucosal thickening noted elsewhere within
the paranasal sinuses.

Other: None.
IMPRESSION: 1. Findings consistent with acute bilateral otomastoiditis with
otitis externa with extensive infiltrative involvement of the skull
base and upper neck, with associated skull base osteomyelitis.
2. Evidence for mild and/or early intracranial spread of infection
with diffuse dural thickening and enhancement about the adjacent
skull base. No MRI evidence for frank encephalitis or intracranial
abscess.
3. 2.4 cm pre clival collection at the central nasopharynx, likely
abscess. Additional smaller 6 mm abscess adjacent to the right
sigmoid sinus as above.
4. Encasement of the distal cervical ICAs by the extensive
infiltrative infection throughout the skull base without evidence
for vascular occlusion or stroke at this time. No definite evidence
for dural venous sinus thrombosis on this exam as previously
questioned, although follow-up examination with dedicated MRV would
be more sensitive for evaluation of this potential complication.

## 2020-06-19 IMAGING — MR MR MRV HEAD WO/W CM
4 of 6 series · 18 of 48 positions shown · IV contrast (Yes   MULTIHANCE)
Comparison: CT temporal bone [DATE]. MRI head with contrast
[DATE]

CLINICAL DATA: Acute neck pain. Infection. Skull base infection on
CT. Rule out venous thrombosis.

EXAM:
MR VENOGRAM HEAD WITHOUT AND WITH CONTRAST
TECHNIQUE: Angiographic images of the intracranial venous structures were
acquired using MRV technique without and with intravenous contrast.
CONTRAST:  7mL GADAVIST GADOBUTROL 1 MMOL/ML IV SOLN

[Series 3: sag inhance(25 venc) · sagittal · 1.6mm · 0.47mm/px · 9 of 400 slices shown]
[im 22/400]
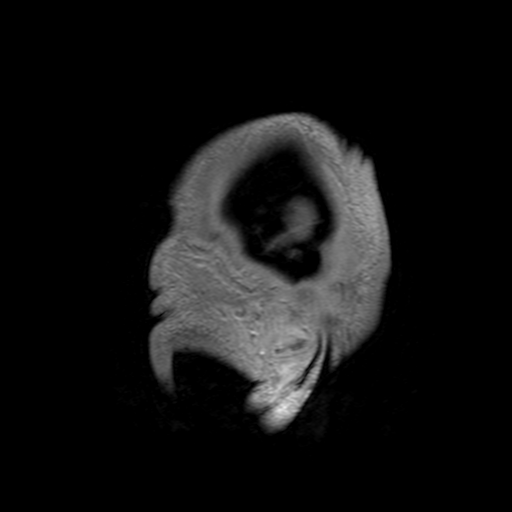
[im 64/400]
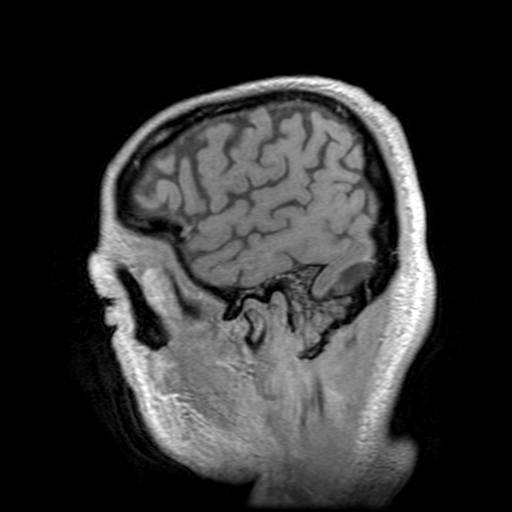
[im 127/400]
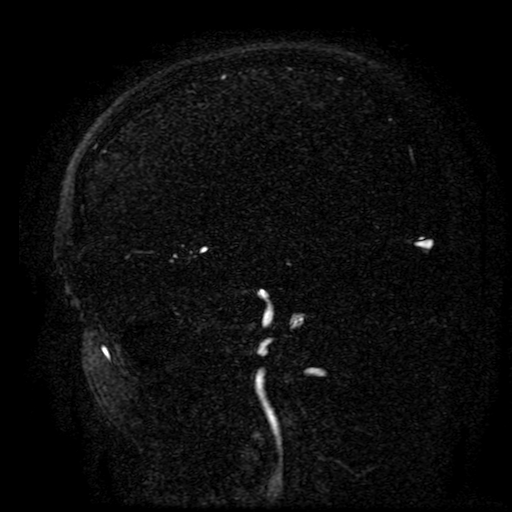
[im 169/400]
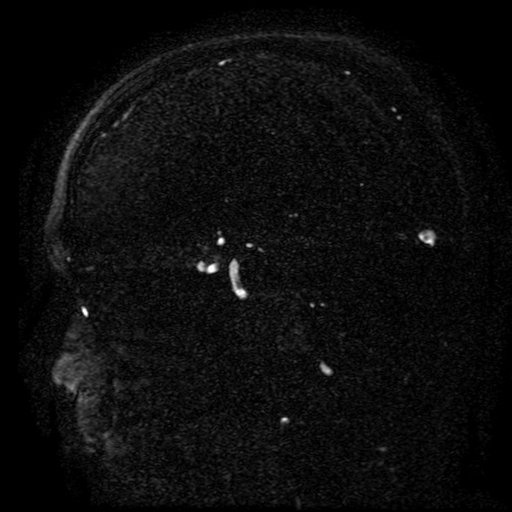
[im 211/400]
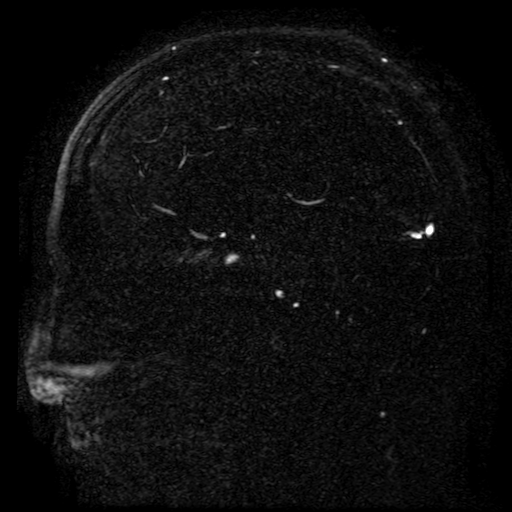
[im 232/400]
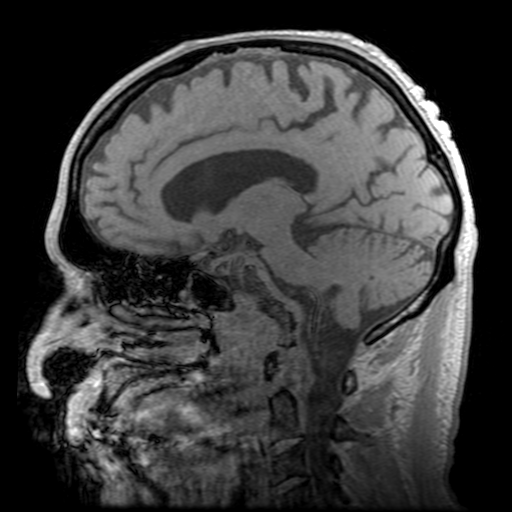
[im 274/400]
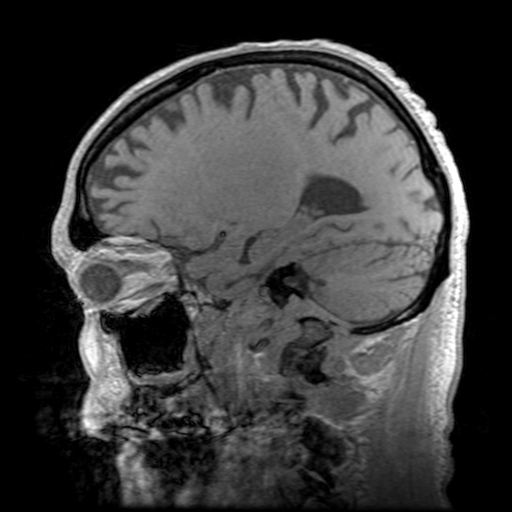
[im 337/400]
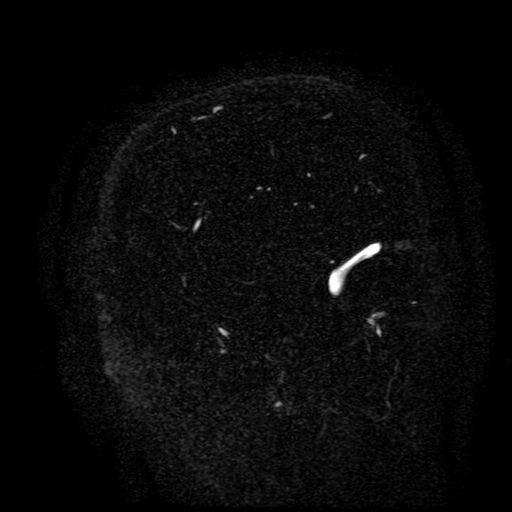
[im 379/400]
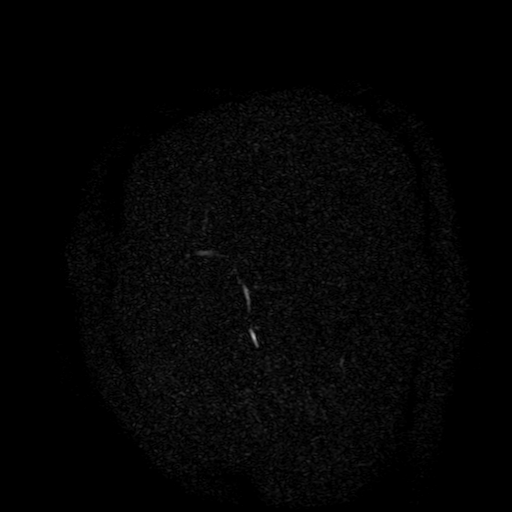

[Series 4: MRV · coronal · 1.5mm · 0.47mm/px · 5 of 131 slices shown]
[im 1/131]
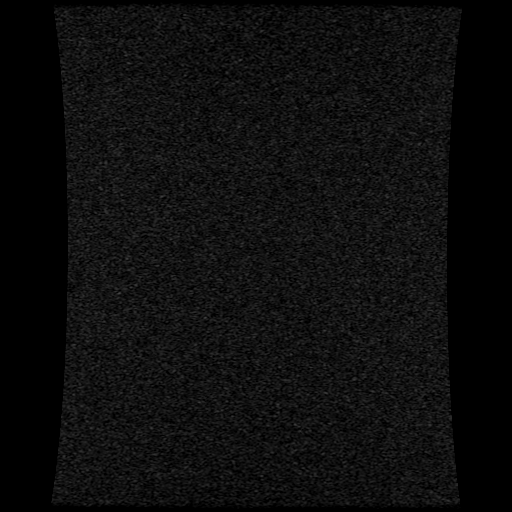
[im 22/131]
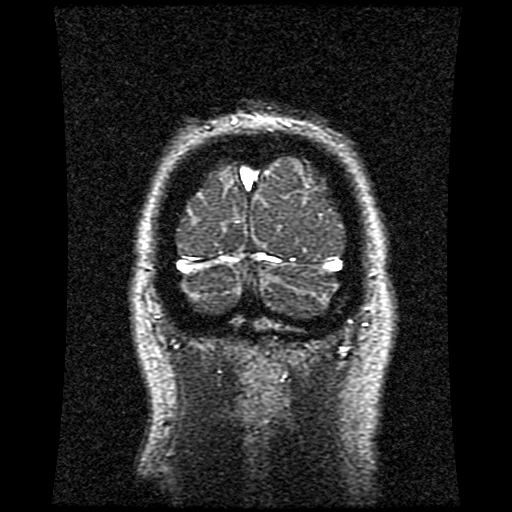
[im 44/131]
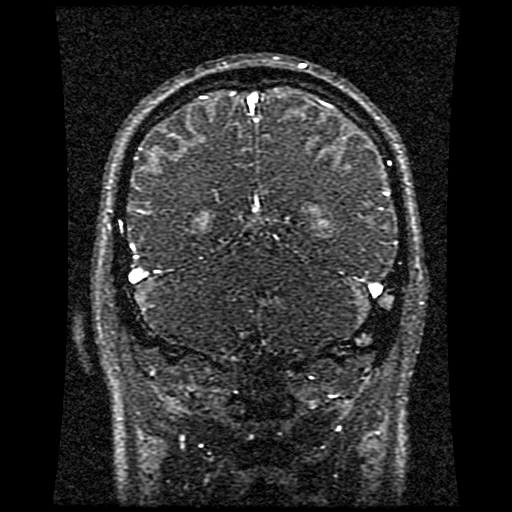
[im 66/131]
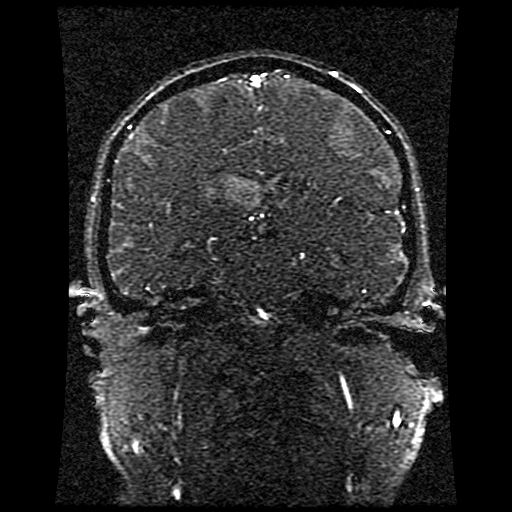
[im 109/131]
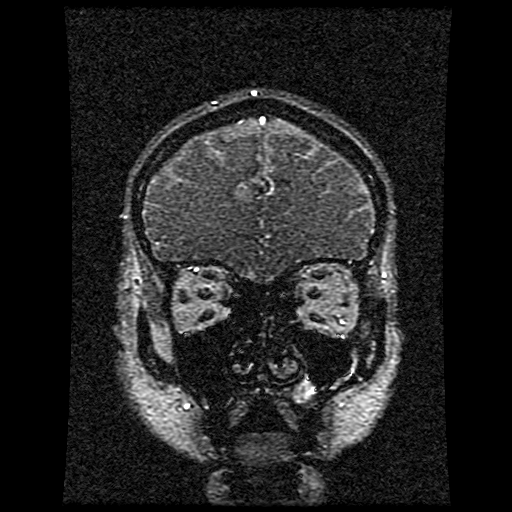

[Series 6: T1 · sagittal · 3.0mm · 0.43mm/px · 1 of 12 slices shown]
[im 1/12]
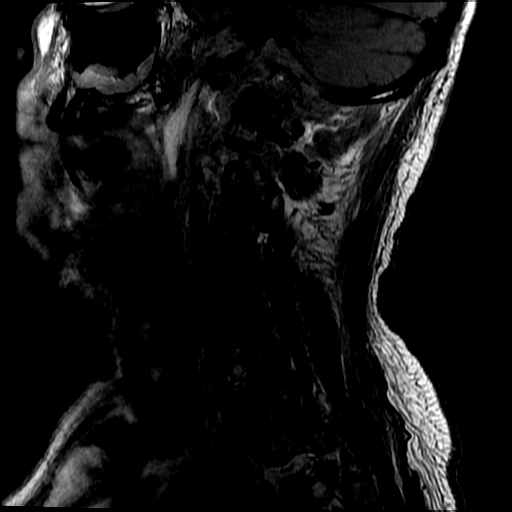

[Series 1500: processed images · axial · 1.0mm · 0.49mm/px · z∈[+22,+155]mm · 3 of 91 slices shown]
[im 1/91]
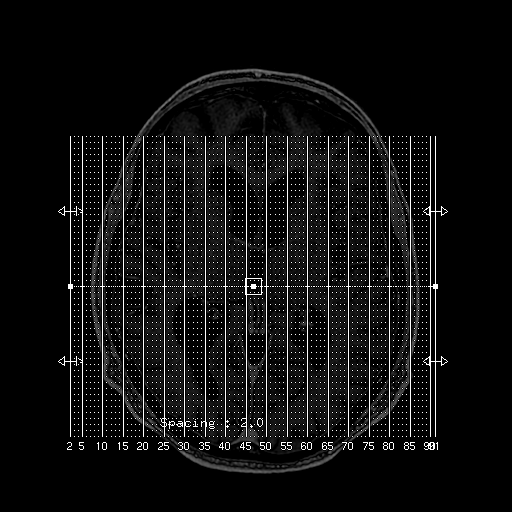
[im 46/91]
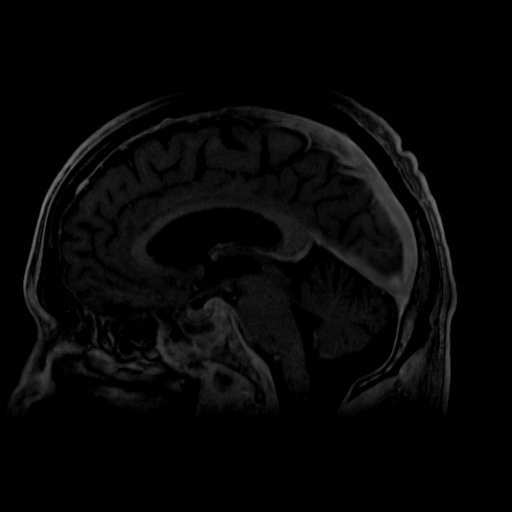
[im 91/91]
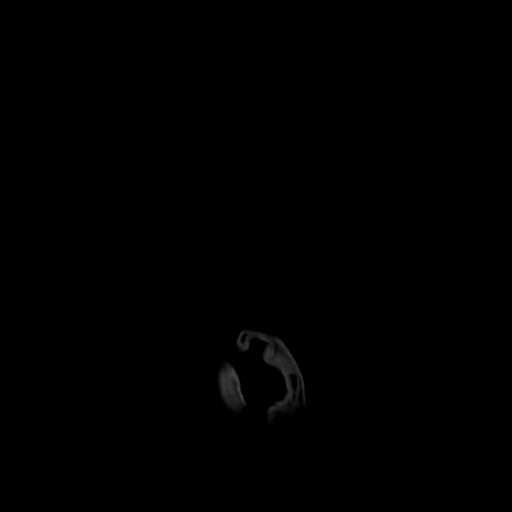

[18 of 48 positions shown; findings below may reference images not displayed]

FINDINGS: Superior sagittal sinus is widely patent. Straight sinus is widely
patent. The transverse sinus is patent bilaterally. There is filling
defect on postcontrast images in the sigmoid sinus and jugular vein
bilaterally compatible with venous thrombosis bilaterally.

Postcontrast imaging reveals extensive enhancement and soft tissue
thickening inferior to the posterior skull base and mastoid sinus
bilaterally. There are bilateral mastoid effusion. There is
extensive soft tissue swelling and enhancement of the external
auditory canal and pinna bilaterally without abscess. There is
extensive soft tissue thickening and enhancement of the posterior
nasopharynx with central nonenhancing fluid collection measuring
approximately 10 x 20 mm, similar to the prior study. Probable
abscess.

There is dural thickening and enhancement in the posterior fossa
along the clivus and around the cerebellum anteriorly compatible
with intracranial spread of infection. No intracranial fluid
collection or abscess is identified. There is mild hydrocephalus,
unchanged. Mild dural thickening and enhancement around both
cerebral hemispheres also likely related to intracranial infection.

There is mucosal edema and mucosal enhancement in the sphenoid sinus
bilaterally. Remaining paranasal sinuses show mild mucosal edema.
IMPRESSION: 1. There is thrombus in the sigmoid sinus and jugular vein
bilaterally compatible with venous sinus thrombosis.
2. Extensive skull base infection. There is extensive soft tissue
swelling and enhancement involving the posterior nasopharynx and
soft tissues below the posterior skull base bilaterally. This
appears centered around the mastoid sinus bilaterally which is
diffusely fluid-filled and has extensive surrounding soft tissue
enhancement. There is enhancement and thickening of the external
auditory canal bilaterally. There is mucosal edema in the paranasal
sinuses primarily in the sphenoid sinus.
3. There is dural thickening and enhancement in the posterior fossa
and to lesser extent around both cerebral hemispheres likely due to
intracranial spread of infection. No intracranial abscess.
4. Findings most compatible with skull base infection which could be
bacterial or fungal.
5. Nonenhancing fluid collection in the posterior nasopharynx likely
due to abscess, unchanged from the recent MRI earlier today.
6. These results were called by telephone at the time of
interpretation on [DATE] at [DATE] to provider POLAD, who
verbally acknowledged these results.

## 2020-06-19 IMAGING — DX DG CHEST 1V PORT
1 series · 1 of 1 positions shown · non-contrast
Comparison: No prior.

CLINICAL DATA: Hypoxia.

EXAM:
PORTABLE CHEST 1 VIEW

[chest]
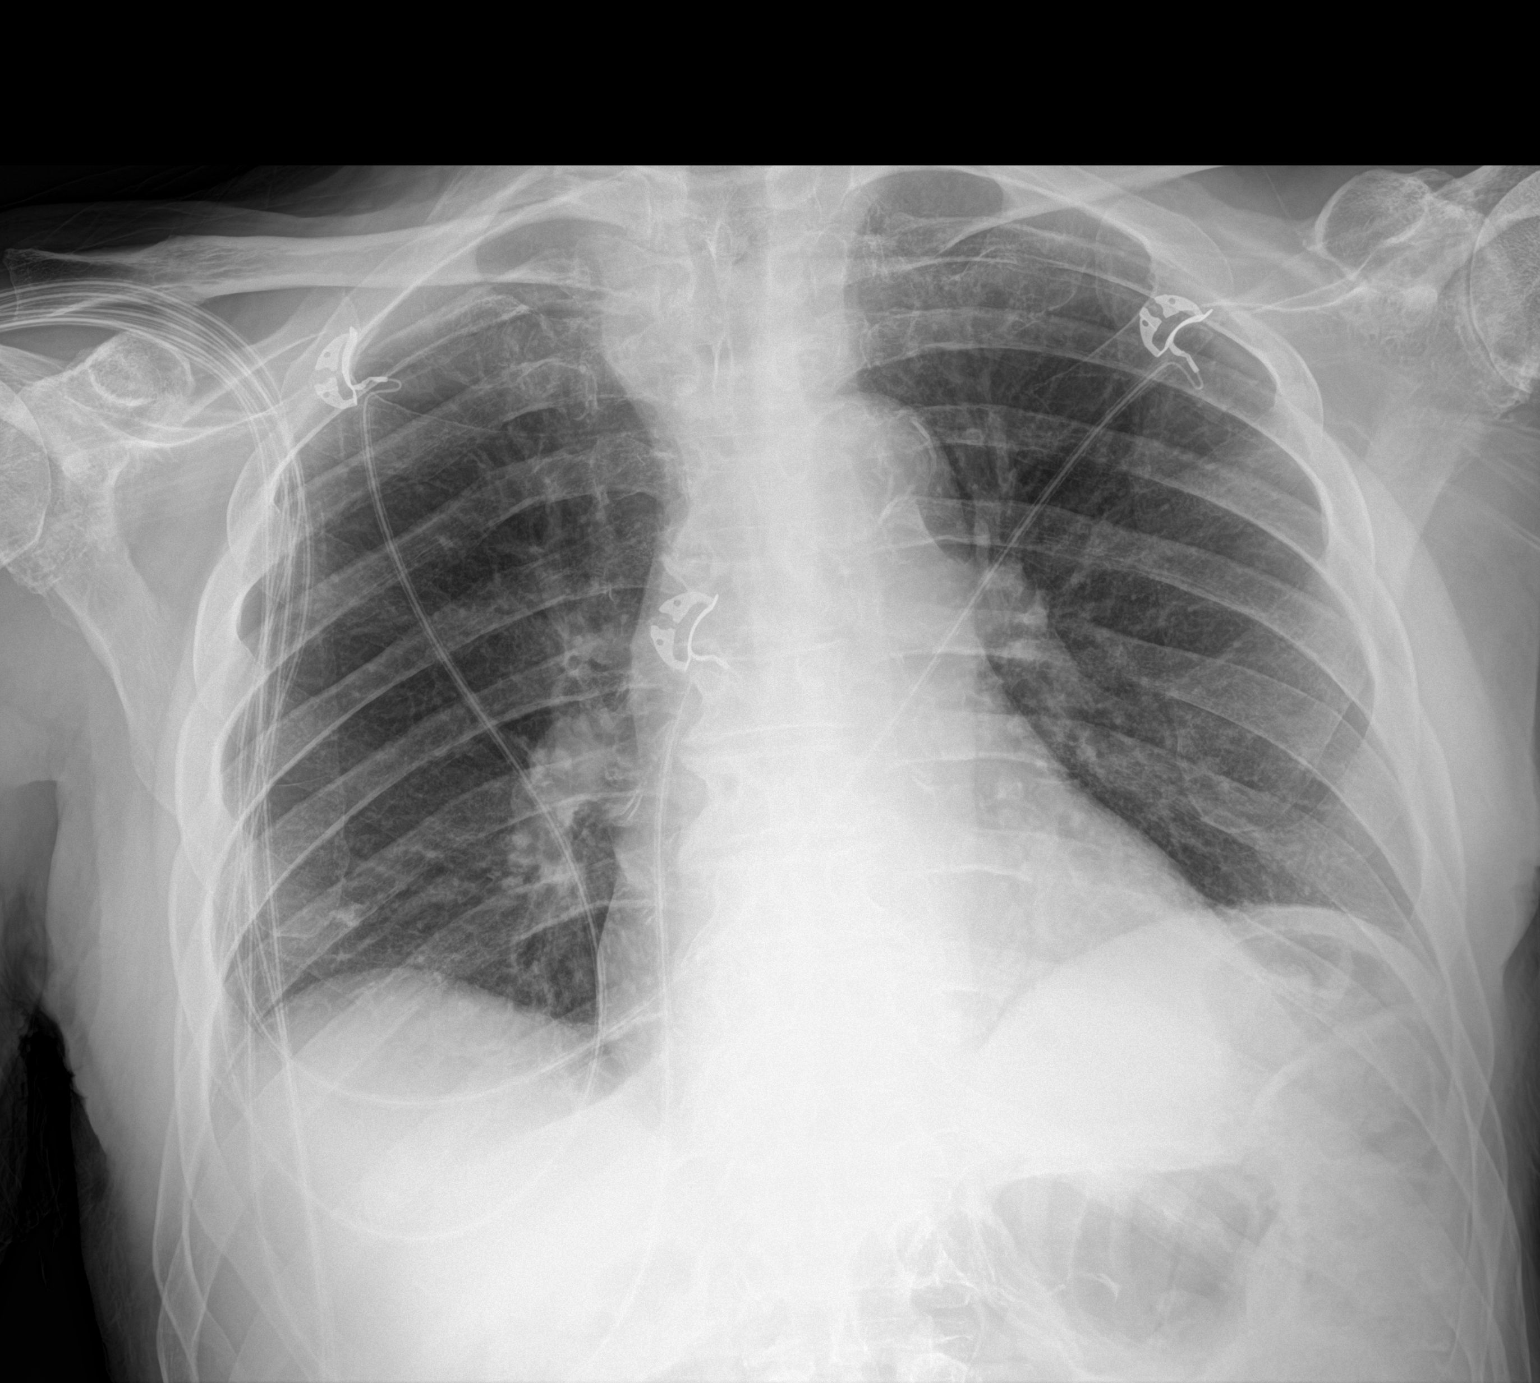

[1 of 1 positions shown; findings below may reference images not displayed]

FINDINGS: Mediastinum hilar structures normal. Heart size normal. Low lung
volumes. No focal infiltrate. No pleural effusion or pneumothorax.
Degenerative change thoracic spine.
IMPRESSION: Low lung volumes.  No acute abnormality identified.

## 2020-06-19 MED ORDER — GADOBUTROL 1 MMOL/ML IV SOLN
7.0000 mL | Freq: Once | INTRAVENOUS | Status: AC | PRN
  Administered 2020-06-19: 7 mL via INTRAVENOUS

## 2020-06-19 MED ORDER — BRIMONIDINE TARTRATE 0.2 % OP SOLN
1.0000 [drp] | Freq: Two times a day (BID) | OPHTHALMIC | Status: DC
  Administered 2020-06-19 – 2020-06-24 (×11): 1 [drp] via OPHTHALMIC
  Filled 2020-06-19 (×4): qty 5

## 2020-06-19 MED ORDER — PROCHLORPERAZINE EDISYLATE 10 MG/2ML IJ SOLN
5.0000 mg | Freq: Four times a day (QID) | INTRAMUSCULAR | Status: DC | PRN
  Administered 2020-06-19 – 2020-06-20 (×2): 5 mg via INTRAVENOUS
  Filled 2020-06-19 (×2): qty 2

## 2020-06-19 MED ORDER — LEVOTHYROXINE SODIUM 88 MCG PO TABS
88.0000 ug | ORAL_TABLET | Freq: Every day | ORAL | Status: DC
  Administered 2020-06-20 – 2020-06-24 (×5): 88 ug via ORAL
  Filled 2020-06-19 (×7): qty 1

## 2020-06-19 MED ORDER — INSULIN ASPART 100 UNIT/ML IJ SOLN
0.0000 [IU] | Freq: Every day | INTRAMUSCULAR | Status: DC
  Administered 2020-06-19: 2 [IU] via SUBCUTANEOUS
  Administered 2020-06-22 – 2020-06-23 (×2): 3 [IU] via SUBCUTANEOUS

## 2020-06-19 MED ORDER — ACETAMINOPHEN 650 MG RE SUPP: 650.0000 mg | Freq: Four times a day (QID) | RECTAL | Status: DC | PRN

## 2020-06-19 MED ORDER — ACETAMINOPHEN 325 MG PO TABS: 650.0000 mg | ORAL_TABLET | Freq: Four times a day (QID) | ORAL | Status: DC | PRN

## 2020-06-19 MED ORDER — MAGNESIUM SULFATE 4 GM/100ML IV SOLN
4.0000 g | Freq: Once | INTRAVENOUS | Status: AC
  Administered 2020-06-19: 4 g via INTRAVENOUS
  Filled 2020-06-19: qty 100

## 2020-06-19 MED ORDER — SODIUM CHLORIDE 0.9 % IV SOLN: INTRAVENOUS | Status: DC

## 2020-06-19 MED ORDER — TOBRAMYCIN-DEXAMETHASONE 0.3-0.1 % OP SUSP
4.0000 [drp] | Freq: Two times a day (BID) | OPHTHALMIC | Status: DC
  Administered 2020-06-19 – 2020-06-24 (×11): 4 [drp] via OTIC
  Filled 2020-06-19 (×4): qty 2.5

## 2020-06-19 MED ORDER — LISINOPRIL 5 MG PO TABS
2.5000 mg | ORAL_TABLET | Freq: Every day | ORAL | Status: DC
  Administered 2020-06-19 – 2020-06-21 (×4): 2.5 mg via ORAL
  Filled 2020-06-19 (×4): qty 1

## 2020-06-19 MED ORDER — GADOBUTROL 1 MMOL/ML IV SOLN
8.0000 mL | Freq: Once | INTRAVENOUS | Status: AC | PRN
  Administered 2020-06-19: 8 mL via INTRAVENOUS

## 2020-06-19 MED ORDER — TIMOLOL HEMIHYDRATE 0.5 % OP SOLN: 1.0000 [drp] | Freq: Two times a day (BID) | OPHTHALMIC | Status: DC

## 2020-06-19 MED ORDER — SODIUM CHLORIDE 0.9 % IV SOLN: INTRAVENOUS | Status: AC

## 2020-06-19 MED ORDER — TIMOLOL MALEATE 0.5 % OP SOLN
1.0000 [drp] | Freq: Two times a day (BID) | OPHTHALMIC | Status: DC
  Administered 2020-06-19 – 2020-06-24 (×10): 1 [drp] via OPHTHALMIC
  Filled 2020-06-19 (×4): qty 5

## 2020-06-19 MED ORDER — INSULIN DETEMIR 100 UNIT/ML ~~LOC~~ SOLN
7.0000 [IU] | Freq: Every day | SUBCUTANEOUS | Status: DC
  Administered 2020-06-19 (×2): 7 [IU] via SUBCUTANEOUS
  Filled 2020-06-19 (×7): qty 0.07

## 2020-06-19 MED ORDER — SIMVASTATIN 20 MG PO TABS
40.0000 mg | ORAL_TABLET | Freq: Every day | ORAL | Status: DC
  Administered 2020-06-19 – 2020-06-23 (×5): 40 mg via ORAL
  Filled 2020-06-19 (×6): qty 2

## 2020-06-19 MED ORDER — ONDANSETRON HCL 4 MG/2ML IJ SOLN
4.0000 mg | Freq: Once | INTRAMUSCULAR | Status: AC
  Administered 2020-06-19: 4 mg via INTRAVENOUS

## 2020-06-19 MED ORDER — INSULIN ASPART 100 UNIT/ML IJ SOLN
0.0000 [IU] | Freq: Three times a day (TID) | INTRAMUSCULAR | Status: DC
  Administered 2020-06-20: 1 [IU] via SUBCUTANEOUS
  Administered 2020-06-23: 4 [IU] via SUBCUTANEOUS
  Administered 2020-06-23: 1 [IU] via SUBCUTANEOUS

## 2020-06-19 MED ORDER — MAGNESIUM SULFATE 4 GM/100ML IV SOLN
4.0000 g | Freq: Once | INTRAVENOUS | Status: DC
  Filled 2020-06-19: qty 100

## 2020-06-19 MED ORDER — HYDRALAZINE HCL 20 MG/ML IJ SOLN
10.0000 mg | INTRAMUSCULAR | Status: DC | PRN
  Administered 2020-06-19 – 2020-06-23 (×2): 10 mg via INTRAVENOUS
  Filled 2020-06-19 (×3): qty 1

## 2020-06-19 NOTE — ED Notes (Signed)
Patient transported to MRI 

## 2020-06-19 NOTE — ED Notes (Addendum)
Pt had episode of dizziness and vomiting after standing. Pt helped back to bed and provided emesis bag. Pt currently no complaining of dizziness or nausea. MD notified.

## 2020-06-19 NOTE — Consult Note (Addendum)
NEURO HOSPITALIST CONSULT NOTE   Requestig physician: Dr. 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 male with a PMHx of DM, HTN and hypothyroidism who has been followed by ENT for chronic otitis externa. He presented to the ED with complaints of dizziness and ear infection with copious drainage bilaterally. He has experienced recent copious drainage from his ears in conjunction with loss of his hearing and has also developed a right facial palsy. On 5/27 CT of his temporal bones with contrast revealed severe bilateral otomastoiditis and skull base osteomyelitis with phlegmon and possible preclival abscess. There was also likely thrombosis of the distal sigmoid sinuses bilaterally in conjunction with narrowing of the internal jugular veins at the skull base by phlegmon. Intracranial extension of the phlegmon was also suspected. Based on CT findings, ENT recommended admission to the hospital. Dr. Ilsa Iha of ID was consulted who recommended treatment with cefepime, which he is now on at 2g IV every 8 hours. ENT is planning on a biopsy.   He has had recurrent ear infections since August of last year, at which time he was residing in [place]. He was treated with ABX. 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 His copious ear drainage continued to progress and he gradually lost hearing in both ears. He currently relies on the written word for communication. He was seen by Dr. Annalee Genta on 5/10; culture at that time grew pseudomonas.   He has had symptoms of dysequilibrium. He did have an episode of nausea with vomiting in the ED, but now states that his  nausea has subsided. He did have episodes of apnea with desaturation; observation by RT revealed findings strongly suggestive of OSA, for which he does not have an official diagnosis per daughter. .   Neurosurgery has been consulted with no plans for surgical intervention at this time.   MRV obtained today revealed bilateral sigmoid sinus and proximal jugular vein thromboses. Neurology was consulted for recommendations on management.   Past Medical History:  Diagnosis Date  . Diabetes mellitus without complication (HCC)   . Hypertension   . Thyroid disease     Past Surgical History:  Procedure Laterality Date  . JOINT REPLACEMENT Bilateral    Hips and Knees    History reviewed. No pertinent family history.            Social History:  reports that he has never smoked. He has never used smokeless tobacco. He reports that he does not drink alcohol and does not use drugs.  No Known Allergies  MEDICATIONS:                                                                                                                     Prior to Admission:  Medications Prior to Admission  Medication Sig Dispense Refill Last Dose  . aspirin 81 MG EC tablet Take 81 mg by mouth daily.   06/18/2020 at Unknown time  . brimonidine (ALPHAGAN) 0.2 % ophthalmic solution Place 1 drop into the right eye in the morning and at bedtime.   06/18/2020 at Unknown time  . cholecalciferol (VITAMIN D) 25 MCG (1000 UNIT) tablet Take 1,000 Units by mouth daily.   06/18/2020 at Unknown time  . ciprofloxacin-dexamethasone (CIPRODEX) OTIC suspension Place 4 drops into both ears See admin instructions. Bid x 10 days   06/18/2020 at Unknown time  . ibuprofen (ADVIL) 200 MG tablet Take 400 mg by mouth every 6 (six) hours as needed for headache or moderate pain.   unk  . insulin detemir (LEVEMIR) 100 UNIT/ML injection Inject 7 Units into the skin at bedtime.   06/17/2020 at Unknown time  . insulin regular (NOVOLIN R) 100 units/mL  injection Inject 1-10 Units into the skin See admin instructions. Per sliding scale 3 times daily with meals 70-150=0 units 151-200=1 units 201-250=2 units  251-300=3 units 301-350=4 units 351-400=6 units Greater than 400=10 units   06/18/2020 at Unknown time  . levothyroxine (SYNTHROID) 88 MCG tablet Take 88 mcg by mouth daily.   06/18/2020 at Unknown time  . lisinopril (ZESTRIL) 2.5 MG tablet Take 2.5 mg by mouth at bedtime.   06/17/2020 at Unknown time  . metFORMIN (GLUCOPHAGE) 850 MG tablet Take 850 mg by mouth daily.   06/18/2020 at Unknown time  . simvastatin (ZOCOR) 40 MG tablet Take 40 mg by mouth at bedtime.   06/17/2020 at Unknown time  . timolol (BETIMOL) 0.5 % ophthalmic solution Place 1 drop into the right eye 2 (two) times daily.   06/18/2020 at Unknown time  . vitamin B-12 (CYANOCOBALAMIN) 1000 MCG tablet Take 1,000 mcg by mouth daily.   06/18/2020 at Unknown time   Scheduled: . brimonidine  1 drop Right Eye BID  . insulin aspart  0-5 Units Subcutaneous QHS  . insulin aspart  0-9 Units Subcutaneous TID WC  . insulin detemir  7 Units Subcutaneous QHS  . levothyroxine  88 mcg Oral Q0600  . lisinopril  2.5 mg Oral QHS  . simvastatin  40 mg Oral QHS  . timolol  1 drop Right Eye BID  . tobramycin-dexamethasone  4 drop Both EARS BID   Continuous: . ceFEPime (MAXIPIME) IV 2 g (06/19/20 2115)  . heparin 1,000 Units/hr (06/20/20 0131)     ROS:  Feels as though arms and legs are not at full strength. Subjectively feels as though his right leg is weaker than his left. No numbness. Has neck pain. Denies vision loss. No trouble talking. Other ROS ss per HPI. He denies any additional complaints at this time.    Blood pressure (!) 160/79, pulse (!) 53, temperature (!) 97.4 F (36.3 C), temperature source Oral, resp. rate 18, height  (1.803 m), weight 73.9  kg, SpO2 98 %.   General Examination:                                                                                                       Physical Exam  HEENT-  [place]/AT. Pain with neck movement is noted.   Lungs- Respirations unlabored Extremities- Warm and well perfused  Neurological Examination Mental Status: Due to his bilateral deafness, communication with patient is with pen and paper questions from examiner followed by verbal responses by patient. He is awake, alert and oriented x 5. Pleasant and cooperative. Speech is mildly dysarthric, but fluent. Comprehension and naming intact. Good insight.  Cranial Nerves: II: Temporal visual fields intact with no extinction to DSS. Fixates and tracks.  III,IV, VI: Palpebral fissures are asymmetric, right wider than left, but without ptosis. Right eye at times lags the left during visual pursuits, which are otherwise full. No nystagmus.   V: Temp sensation intact bilaterally.  VII: Right facial droop.  VIII: Deaf bilaterally IX,X: No hoarseness or hypophonia XI: Head is midline XII: Midline tongue extension Motor: Right : Upper extremity   5/5    Left:     Upper extremity   5/5  Lower extremity   5/5     Lower extremity   5/5 Tone and bulk:normal tone throughout; no atrophy noted Sensory: Temp sensation subjectively intact BUE, but decreased below knees in the context of SCDs. FT intact x 4.  Deep Tendon Reflexes: 1+ bilateral brachioradialis and biceps. Unable to elicit patellar reflexes.  Cerebellar: No ataxia with FNF bilaterally Gait: Deferred   Lab Results: Basic Metabolic Panel: Recent Labs  Lab 06/18/20 2047 06/19/20 0224 06/19/20 0929  NA 129* 128*  --   K 4.2 3.9  --   CL 91* 91*  --   CO2 31 25  --   GLUCOSE 204* 223*  --   BUN 19 17  --   CREATININE 0.79 0.70  --   CALCIUM 8.5* 8.4*  --   MG  --   --  1.4*    CBC: Recent Labs  Lab 06/18/20 2047 06/19/20 0224  WBC 7.7 8.9  NEUTROABS 6.0  --   HGB 10.4*  11.2*  HCT 31.4* 35.2*  MCV 90.2 92.1  PLT 484* 489*    Cardiac Enzymes: No results for input(s): CKTOTAL, CKMB, CKMBINDEX, TROPONINI in the last 168 hours.  Lipid Panel: No results for input(s): CHOL, TRIG, HDL, CHOLHDL, VLDL, LDLCALC in the last 168 hours.  Imaging: MR BRAIN W WO CONTRAST  Result Date: 06/19/2020 CLINICAL DATA:  Initial evaluation for acute otitis media with skull base  infection. EXAM: MRI HEAD WITHOUT AND WITH CONTRAST TECHNIQUE: Multiplanar, multiecho pulse sequences of the brain and surrounding structures were obtained without and with intravenous contrast. CONTRAST:  21mL GADAVIST GADOBUTROL 1 MMOL/ML IV SOLN COMPARISON:  Recent temporal bone CT from 06/15/2020. FINDINGS: Brain: Cerebral volume within normal limits for age. Minimal scattered T2/FLAIR hyperintensity seen involving the supratentorial cerebral white matter, likely chronic microvascular ischemic disease, minimal for age. 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 No evidence for acute or subacute ischemia. No acute or chronic intracranial hemorrhage. No mass lesion, mass effect, or midline shift. No hydrocephalus. Again seen are large bilateral mastoid and middle ear effusions. Extensive abnormal infiltrative soft tissue density and thickening seen extending medially to involve the skull base including the petroclival regions, pre clival nasopharynx, with involvement of the jugular foramina bilaterally. Associated osseous erosion better appreciated on prior temporal bone CT. Area demonstrates diffuse heterogeneous signal abnormality with fairly avid postcontrast enhancement. Associated soft tissue swelling and thickening involves the visualized external auditory canals as well, with associated narrowing of both EACs. Constellation of findings consistent with extensive bilateral otitis media/otitis externa with associated infiltrative skull base infection. Heterogeneous marrow signal  intensity within the clivus and petrous portions of both temporal bones likely reflect associated osteomyelitis. Pre clival nasopharyngeal collection again seen, measuring 2.4 x 0.9 cm (series 25, image 3). Additional probable small fluid collection seen dissecting medially from the right mastoid air cells just above the sigmoid sinus and towards the right cerebellar pontine angle cistern, measuring approximately 6 x 5 mm (series 25, image 4). There is associated enhancing phlegmonous change along the posterior aspect of the clivus (series 25, image 3). Associated smooth dural thickening and enhancement throughout the adjacent skull base and along the inferior temporal convexities bilaterally, likely reflecting early intracranial spread of infection (series 26, image 7). This is also seen on axial images as well (series 25, image 8). No parenchymal edema to suggest frank encephalitis within the brain itself. No visible evidence for spread of infection into either internal auditory canal. No other acute intracranial abnormality. Vascular: Encasement of the distal cervical ICAs and proximal internal jugular veins by the infiltrative phlegmonous change throughout the skull base again noted. Major arterial intracranial vascular flow voids are maintained without evidence for occlusion or other complication at this time. The major dural sinuses appear grossly patent as well, with no visible evidence for dural sinus thrombosis on this non MRV exam as previously questioned. Skull and upper cervical spine: Evidence for associated osteomyelitis involving the petrous temporal bones bilaterally and clivus. Additional involvement of the left greater than right occipital condyles as well as the lateral masses of C1 and dens. Additional diffuse edema seen throughout the soft tissues of the visualized upper neck (series 23, image 1). No other visible collections. Sinuses/Orbits: Patient status post bilateral ocular lens  replacement. Globes and orbital soft tissues demonstrate no other acute finding. Evidence for concomitant/associated right sphenoid sinusitis. Additional mild mucosal thickening noted elsewhere within the paranasal sinuses. Other: None. IMPRESSION: 1. Findings consistent with acute bilateral otomastoiditis with otitis externa with extensive infiltrative involvement of the skull base and upper neck, with associated skull base osteomyelitis. 2. Evidence for mild and/or early intracranial spread of infection with diffuse dural thickening and enhancement about the adjacent skull base. No MRI evidence for frank encephalitis or intracranial abscess. 3. 2.4 cm pre clival collection at the central nasopharynx, likely abscess. Additional smaller 6 mm abscess adjacent to the right sigmoid  sinus as above. 4. Encasement of the distal cervical ICAs by the extensive infiltrative infection throughout the skull base without evidence for vascular occlusion or stroke at this time. No definite evidence for dural venous sinus thrombosis on this exam as previously questioned, although follow-up examination with dedicated MRV would be more sensitive for evaluation of this potential complication. Electronically Signed   By: Rise MuBenjamin  McClintock M.D.   On: 06/19/2020 04:38   MR CERVICAL SPINE W WO CONTRAST  Result Date: 06/19/2020 CLINICAL DATA:  Neck pain.  Skull base infection. EXAM: MRI CERVICAL SPINE WITHOUT AND WITH CONTRAST TECHNIQUE: Multiplanar and multiecho pulse sequences of the cervical spine, to include the craniocervical junction and cervicothoracic junction, were obtained without and with intravenous contrast. CONTRAST:  7mL GADAVIST GADOBUTROL 1 MMOL/ML IV SOLN COMPARISON:  MRI head 06/19/2020 FINDINGS: Alignment: Normal Vertebrae: Negative for fracture. There is C1-2 arthropathy with joint effusion and synovial thickening and enhancement around the dens and anterior arch of C1 as well as posterior to the dens. There is  extensive surrounding soft tissue enhancement below the skull base and anterior to C1 compatible with infection. Bony erosion of the skull base is best seen on recent CT. Cord: Cord evaluation is limited by motion. No cord signal abnormality identified. No epidural abscess identified. Posterior Fossa, vertebral arteries, paraspinal tissues: Refer to MRI and MR venogram report from today. Briefly, there is extensive soft tissue swelling below the posterior skull base and clivus. There is dural thickening and enhancement posterior to the clivus. Bilateral mastoid effusion. Disc levels: Mild degenerative change in the cervical spine. Bilateral foraminal stenosis at C3-4, C4-5, and C5-6 due to disc and facet degeneration. IMPRESSION: Extensive soft tissue enhancement below the skull base. This extends into the C1-2 joint which is likely involved with infection and septic arthritis. Bony erosion of the skull base best seen on CT compatible with osteomyelitis. No epidural abscess in the cervical spine. Dural thickening enhancement in the posterior fossa compatible with intracranial extension of infection. Electronically Signed   By: Marlan Palauharles  Clark M.D.   On: 06/19/2020 14:42   DG CHEST PORT 1 VIEW  Result Date: 06/19/2020 CLINICAL DATA:  Hypoxia. EXAM: PORTABLE CHEST 1 VIEW COMPARISON:  No prior. FINDINGS: Mediastinum hilar structures normal. Heart size normal. Low lung volumes. No focal infiltrate. No pleural effusion or pneumothorax. Degenerative change thoracic spine. IMPRESSION: Low lung volumes.  No acute abnormality identified. Electronically Signed   By: Maisie Fushomas  Register   On: 06/19/2020 05:28   MR MRV HEAD W WO CONTRAST  Result Date: 06/19/2020 CLINICAL DATA:  Acute neck pain. Infection. Skull base infection on CT. Rule out venous thrombosis. EXAM: MR VENOGRAM HEAD WITHOUT AND WITH CONTRAST TECHNIQUE: Angiographic images of the intracranial venous structures were acquired using MRV technique without and  with intravenous contrast. CONTRAST:  7mL GADAVIST GADOBUTROL 1 MMOL/ML IV SOLN COMPARISON:  CT temporal bone 06/15/2020. MRI head with contrast 06/19/2020 FINDINGS: Superior sagittal sinus is widely patent. Straight sinus is widely patent. The transverse sinus is patent bilaterally. There is filling defect on postcontrast images in the sigmoid sinus and jugular vein bilaterally compatible with venous thrombosis bilaterally. Postcontrast imaging reveals extensive enhancement and soft tissue thickening inferior to the posterior skull base and mastoid sinus bilaterally. There are bilateral mastoid effusion. There is extensive soft tissue swelling and enhancement of the external auditory canal and pinna bilaterally without abscess. There is extensive soft tissue thickening and enhancement of the posterior nasopharynx with central nonenhancing fluid collection measuring  approximately 10 x 20 mm, similar to the prior study. Probable abscess. There is dural thickening and enhancement in the posterior fossa along the clivus and around the cerebellum anteriorly compatible with intracranial spread of infection. No intracranial fluid collection or abscess is identified. There is mild hydrocephalus, unchanged. Mild dural thickening and enhancement around both cerebral hemispheres also likely related to intracranial infection. There is mucosal edema and mucosal enhancement in the sphenoid sinus bilaterally. Remaining paranasal sinuses show mild mucosal edema. IMPRESSION: 1. There is thrombus in the sigmoid sinus and jugular vein bilaterally compatible with venous sinus thrombosis. 2. Extensive skull base infection. There is extensive soft tissue swelling and enhancement involving the posterior nasopharynx and soft tissues below the posterior skull base bilaterally. This appears centered around the mastoid sinus bilaterally which is diffusely fluid-filled and has extensive surrounding soft tissue enhancement. There is  enhancement and thickening of the external auditory canal bilaterally. There is mucosal edema in the paranasal sinuses primarily in the sphenoid sinus. 3. There is dural thickening and enhancement in the posterior fossa and to lesser extent around both cerebral hemispheres likely due to intracranial spread of infection. No intracranial abscess. 4. Findings most compatible with skull base infection which could be bacterial or fungal. 5. Nonenhancing fluid collection in the posterior nasopharynx likely due to abscess, unchanged from the recent MRI earlier today. 6. These results were called by telephone at the time of interpretation on 06/19/2020 at 2:29 pm to provider [place], who verbally acknowledged these results. Electronically Signed   By: Marlan Palau M.D.   On: 06/19/2020 14:29    Assessment: 87 year old male with skull base osteomyelitis and associated cranial nerve deficits. Neurology consulted regarding venous sinus thromboses seen on MRV brain.  1. Exam reveals dysarthria, right facial droop and mild paresis of EOM OD. Also with pain on neck rotation and flexion.  2. MRV head: There is thrombus in the sigmoid sinus and jugular vein bilaterally compatible with venous sinus thrombosis.  3. Also noted on MRV and MRI brain with/without contrast are findings consistent with acute bilateral otomastoiditis with otitis externa with extensive infiltrative involvement of the skull base and upper neck, with associated skull base osteomyelitis. There is associated soft tissue swelling and enhancement involving the posterior nasopharynx and soft tissues below the posterior skull base bilaterally. There is evidence for mild and/or early intracranial spread of infection with diffuse dural thickening and enhancement about the adjacent skull base. Similar findings seen to a lesser extent around both cerebral hemispheres likely due to intracranial spread of infection. No MRI evidence for frank encephalitis or  intracranial abscess.  4. ENT findings on MRI include a 2.4 cm pre clival collection at the central nasopharynx, likely abscess, and an additional smaller 6 mm abscess adjacent to the right sigmoid sinus. There is enhancement and thickening of the external auditory canal bilaterally. There is mucosal edema in the paranasal sinuses primarily in the sphenoid sinus. Findings are most compatible with skull base infection which could be bacterial or fungal.  5. Arterial findings on MRI include encasement of the distal cervical ICAs by the extensive infiltrative infection throughout the skull base without evidence for vascular occlusion or stroke.  6. MRI cervical spine reveals extensive soft tissue enhancement below the skull base, which extends into the C1-2 joint which is likely involved with infection and septic arthritis. Bony erosion of the skull base best seen on CT compatible with osteomyelitis. No epidural abscess in the cervical spine.  Recommendations: 1.  Start IV heparin with no bolus for treatment of bilateral venous sinus thromboses. Pharmacy has been consulted.  2. Confirm MRV findings with CT venogram of the head (ordered).  3. Antibiotics per ID.  4. Possible biopsy per ENT.     Electronically signed: Dr. Caryl Pina 06/19/2020, 11:35 PM

## 2020-06-19 NOTE — Progress Notes (Signed)
ENT Progress Note: HD #1    Subjective: Sleeping, complaining of headache and vomiting  Objective: Vital signs in last 24 hours: Temp:  [97.9 F (36.6 C)-98 F (36.7 C)] 97.9 F (36.6 C) (05/31 0850) Pulse Rate:  [53-76] 53 (05/31 0845) Resp:  [8-18] 9 (05/31 0845) BP: (138-217)/(63-110) 158/66 (05/31 0845) SpO2:  [92 %-100 %] 100 % (05/31 0845) Weight:  [73.9 kg] 73.9 kg (05/31 0900) Weight change:     Intake/Output from previous day: No intake/output data recorded. Intake/Output this shift: Total I/O In: 100 [IV Piggyback:100] Out: -   Labs: Recent Labs    06/18/20 2047 06/19/20 0224  WBC 7.7 8.9  HGB 10.4* 11.2*  HCT 31.4* 35.2*  PLT 484* 489*   Recent Labs    06/18/20 2047 06/19/20 0224  NA 129* 128*  K 4.2 3.9  CL 91* 91*  CO2 31 25  GLUCOSE 204* 223*  BUN 19 17  CALCIUM 8.5* 8.4*    Studies/Results: MR BRAIN W WO CONTRAST  Result Date: 06/19/2020 CLINICAL DATA:  Initial evaluation for acute otitis media with skull base infection. EXAM: MRI HEAD WITHOUT AND WITH CONTRAST TECHNIQUE: Multiplanar, multiecho pulse sequences of the brain and surrounding structures were obtained without and with intravenous contrast. CONTRAST:  65mL GADAVIST GADOBUTROL 1 MMOL/ML IV SOLN COMPARISON:  Recent temporal bone CT from 06/15/2020. FINDINGS: Brain: Cerebral volume within normal limits for age. Minimal scattered T2/FLAIR hyperintensity seen involving the supratentorial cerebral white matter, likely chronic microvascular ischemic disease, minimal for age. 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 No evidence for acute or subacute ischemia. No acute or chronic intracranial hemorrhage. No mass lesion, mass effect, or midline shift. No hydrocephalus. Again seen are large bilateral mastoid and middle ear effusions. Extensive abnormal infiltrative soft tissue density and thickening seen extending medially to involve the skull base including the  petroclival regions, pre clival nasopharynx, with involvement of the jugular foramina bilaterally. Associated osseous erosion better appreciated on prior temporal bone CT. Area demonstrates diffuse heterogeneous signal abnormality with fairly avid postcontrast enhancement. Associated soft tissue swelling and thickening involves the visualized external auditory canals as well, with associated narrowing of both EACs. Constellation of findings consistent with extensive bilateral otitis media/otitis externa with associated infiltrative skull base infection. Heterogeneous marrow signal intensity within the clivus and petrous portions of both temporal bones likely reflect associated osteomyelitis. Pre clival nasopharyngeal collection again seen, measuring 2.4 x 0.9 cm (series 25, image 3). Additional probable small fluid collection seen dissecting medially from the right mastoid air cells just above the sigmoid sinus and towards the right cerebellar pontine angle cistern, measuring approximately 6 x 5 mm (series 25, image 4). There is associated enhancing phlegmonous change along the posterior aspect of the clivus (series 25, image 3). Associated smooth dural thickening and enhancement throughout the adjacent skull base and along the inferior temporal convexities bilaterally, likely reflecting early intracranial spread of infection (series 26, image 7). This is also seen on axial images as well (series 25, image 8). No parenchymal edema to suggest frank encephalitis within the brain itself. No visible evidence for spread of infection into either internal auditory canal. No other acute intracranial abnormality. Vascular: Encasement of the distal cervical ICAs and proximal internal jugular veins by the infiltrative phlegmonous change throughout the skull base again noted. Major arterial intracranial vascular flow voids are maintained without evidence for occlusion or other complication at this time. The major dural sinuses  appear grossly patent  as well, with no visible evidence for dural sinus thrombosis on this non MRV exam as previously questioned. Skull and upper cervical spine: Evidence for associated osteomyelitis involving the petrous temporal bones bilaterally and clivus. Additional involvement of the left greater than right occipital condyles as well as the lateral masses of C1 and dens. Additional diffuse edema seen throughout the soft tissues of the visualized upper neck (series 23, image 1). No other visible collections. Sinuses/Orbits: Patient status post bilateral ocular lens replacement. Globes and orbital soft tissues demonstrate no other acute finding. Evidence for concomitant/associated right sphenoid sinusitis. Additional mild mucosal thickening noted elsewhere within the paranasal sinuses. Other: None. IMPRESSION: 1. Findings consistent with acute bilateral otomastoiditis with otitis externa with extensive infiltrative involvement of the skull base and upper neck, with associated skull base osteomyelitis. 2. Evidence for mild and/or early intracranial spread of infection with diffuse dural thickening and enhancement about the adjacent skull base. No MRI evidence for frank encephalitis or intracranial abscess. 3. 2.4 cm pre clival collection at the central nasopharynx, likely abscess. Additional smaller 6 mm abscess adjacent to the right sigmoid sinus as above. 4. Encasement of the distal cervical ICAs by the extensive infiltrative infection throughout the skull base without evidence for vascular occlusion or stroke at this time. No definite evidence for dural venous sinus thrombosis on this exam as previously questioned, although follow-up examination with dedicated MRV would be more sensitive for evaluation of this potential complication. Electronically Signed   By: Rise Mu M.D.   On: 06/19/2020 04:38   DG CHEST PORT 1 VIEW  Result Date: 06/19/2020 CLINICAL DATA:  Hypoxia. EXAM: PORTABLE CHEST 1  VIEW COMPARISON:  No prior. FINDINGS: Mediastinum hilar structures normal. Heart size normal. Low lung volumes. No focal infiltrate. No pleural effusion or pneumothorax. Degenerative change thoracic spine. IMPRESSION: Low lung volumes.  No acute abnormality identified. Electronically Signed   By: Maisie Fus  Register   On: 06/19/2020 05:28     PHYSICAL EXAM: Bilateral tympanostomy tubes in place, continued otorrhea.  No swelling or periauricular erythema. Right facial weakness   Assessment/Plan: Patient with a complicated ongoing history of chronic ear infection, drainage and right facial weakness.  Symptoms began in August 2021, patient living in [place] independently with children scattered around the country.  Daughter Lorina Rabon in [place] brought him here for evaluation, patient seen as an outpatient in our office in mid April with a 17-month history of right otalgia, previous bilateral myringotomy and tube placement with chronic otorrhea and new onset facial weakness which began in February 2022.  No previous records, scans or work-up available for review.  Patient treated for presumed low-grade otitis, subsequent culture showed Pseudomonas with resistance to ongoing flora quinolone therapy.  Patient started on TobraDex topical eardrops and underwent temporal bone CT scan on 06/15/2020.  CT scan showed bilateral mastoid opacification with chronic appearing bony destruction involving the mastoids, clivus and skull base consistent with chronic infection and osteomyelitis.  Concerns raised regarding possible tumor.  The patient had significant inflammatory changes involving the skull base and soft tissue with possible fluid collection in the retropharynx.  MRI scan performed at the time of his admission consistent with his diagnosis of osteomyelitis with a possible fluid collection versus phlegmon in the preclival region.  Patient has been stable but over the weekend developed symptoms of disequilibrium,  increased headache and vomiting.  Patient mated to medical service for management and treatment of medical issues and chronic osteomyelitis.  Infectious disease service consulted for  additional input.  Given patient's history and findings I would not recommend any surgical intervention at this time.  We will continue with initial medical therapy, monitoring clinical situation.  Patient scheduled to undergo MR of the neck and MRV.  Recommend IV hydration, continued IV cefepime and tobramycin topical eardrops.  We will follow with medical and infectious the service may consider possible intervention regarding abscess if needed given the patient's clinical picture.  Unfortunately this is a severe progressive disease process and would not primarily be surgical in nature.  If the patient has initial recovery he will require long-term intravenous antibiotic therapy and monitoring.  I also discussed the grave nature of the patient's clinical picture with his daughter.   Osborn Coho 06/19/2020, 11:52 AM

## 2020-06-19 NOTE — Progress Notes (Addendum)
Pt uses whiteboard for communication.  He cannot hear d/t infections in both ears, but he can verbally answer questions you write on his whiteboard.  He tends to yell when he responds because he doesn't realize how loud he's talking.

## 2020-06-19 NOTE — Progress Notes (Signed)
RT NOTE:  Daughter for [employer] is refusing ABG ordered for due to patient having apneas and desaturation. She does not want to patient to be bothered while he is sleeping. I did stand in the room and watch the patient briefly and he show all signs of OSA. According to the daughter he has not had an official diagnosis. He desats to the 60's during apneas but recovers Spo2 to 96-98% quickly. MD notified.

## 2020-06-19 NOTE — H&P (Addendum)
History and Physical    Nicolas James PXT:[id number] DOB: 1933/02/27 DOA: 06/18/2020  PCP: Pcp, No Patient coming from: Home  Chief Complaint: Ear infection, dizziness  HPI: Nicolas James is a 87 y.o. male with medical history significant of insulin-dependent type 2 diabetes, hypertension, hypothyroidism presented to the ED with complaint of ear infection and dizziness.  Patient is followed by ENT for chronic otitis externa.  Recently started having copious drainage from his ears which resulted in loss of hearing and also developed facial palsy. He had a CT of temporal bones with contrast done on 06/15/2020 which revealed findings concerning for severe bilateral otomastoiditis with associated skull base osteomyelitis with phlegmon and possible preclival abscess.  Likely thrombosis of the distal sigmoid dural venous sinuses bilaterally with narrowing of the internal jugular veins at the skull base by phlegmon.  Additionally there is suspected intracranial extension of phlegmon with soft tissue noted along the inferior petrous temporal bones bilaterally and possibly the dorsal aspect of the clivus.  In the ED, patient was afebrile and not tachycardic.  Hypertensive with systolic 170-210.  Labs showing WBC 7.7, hemoglobin 10.4, platelet count 484K.  Sodium 129, potassium 4.2, chloride 91, bicarb 31, BUN 19, creatinine 0.7, glucose 204.  Blood culture x2 pending.  COVID and influenza PCR negative. ED physician discussed the case with Dr. Darl Pikes with ENT who is planning on consulting Dr. Annalee Genta who initially saw the patient.  ENT will plan on biopsy at some point.  ED physician also discussed the case with Dr. Ilsa Iha with infectious disease who recommended antibiotic treatment with cefepime.  Lactic acid 1.1.  Patient was given a dose of cefepime.  Daughter states patient has been having recurrent ear infections since August last year.  He was in [place] at that time and was treated with courses of  antibiotics.  In January he developed facial paralysis for which doctors in [place] did work-up including brain MRI and told them that he did not have a stroke and diagnosed him with Bell's palsy.  This problem has progressively worsened and now patient is having copious drainage from his ears and has completely lost his ability to hear.  Daughter has to communicate with him by writing on paper.  States he was seen by Dr. Annalee Genta this month on 5/10 and had a culture done which grew Pseudomonas.  He also had a CT done a few days ago which came back abnormal and the on-call ENT physician advised her to bring him into the hospital.  He has not had any fevers.  Today he felt dizzy and vomited.  No abdominal pain or diarrhea.  Review of Systems:  All systems reviewed and apart from history of presenting illness, are negative.  Past Medical History:  Diagnosis Date  . Diabetes mellitus without complication (HCC)   . Hypertension   . Thyroid disease     Past Surgical History:  Procedure Laterality Date  . JOINT REPLACEMENT Bilateral    Hips and Knees     reports that he has never smoked. He has never used smokeless tobacco. He reports that he does not drink alcohol and does not use drugs.  No Known Allergies  History reviewed. No pertinent family history.  Prior to Admission medications   Medication Sig Start Date End Date Taking? Authorizing Provider  aspirin 81 MG EC tablet Take 81 mg by mouth daily.   Yes [provider]  brimonidine (ALPHAGAN) 0.2 % ophthalmic solution Place 1 drop into the right eye  in the morning and at bedtime.   Yes [provider]  cholecalciferol (VITAMIN D) 25 MCG (1000 UNIT) tablet Take 1,000 Units by mouth daily.   Yes [provider]  ciprofloxacin-dexamethasone (CIPRODEX) OTIC suspension Place 4 drops into both ears See admin instructions. Bid x 10 days 05/09/20  Yes [provider]  ibuprofen (ADVIL) 200 MG tablet Take 400 mg  by mouth every 6 (six) hours as needed for headache or moderate pain.   Yes [provider]  insulin detemir (LEVEMIR) 100 UNIT/ML injection Inject 7 Units into the skin at bedtime.   Yes [provider]  insulin regular (NOVOLIN R) 100 units/mL injection Inject 1-10 Units into the skin See admin instructions. Per sliding scale 3 times daily with meals 70-150=0 units 151-200=1 units 201-250=2 units  251-300=3 units 301-350=4 units 351-400=6 units Greater than 400=10 units   Yes [provider]  levothyroxine (SYNTHROID) 88 MCG tablet Take 88 mcg by mouth daily.   Yes [provider]  lisinopril (ZESTRIL) 2.5 MG tablet Take 2.5 mg by mouth at bedtime.   Yes [provider]  metFORMIN (GLUCOPHAGE) 850 MG tablet Take 850 mg by mouth daily.   Yes [provider]  simvastatin (ZOCOR) 40 MG tablet Take 40 mg by mouth at bedtime.   Yes [provider]  timolol (BETIMOL) 0.5 % ophthalmic solution Place 1 drop into the right eye 2 (two) times daily.   Yes [provider]  vitamin B-12 (CYANOCOBALAMIN) 1000 MCG tablet Take 1,000 mcg by mouth daily.   Yes [provider]    Physical Exam: Vitals:   06/18/20 2300 06/18/20 2315 06/18/20 2330 06/19/20 0000  BP: (!) 170/76 (!) 176/92 (!) 184/91 (!) 217/107  Pulse: 61 (!) 58 (!) 59 76  Resp: 15 11 14 14   Temp:      SpO2: 93% 96% 96% 99%    Physical Exam Constitutional:      General: He is not in acute distress. HENT:     Head: Normocephalic and atraumatic.  Eyes:     Extraocular Movements: Extraocular movements intact.     Conjunctiva/sclera: Conjunctivae normal.  Cardiovascular:     Rate and Rhythm: Normal rate and regular rhythm.     Pulses: Normal pulses.  Pulmonary:     Effort: Pulmonary effort is normal. No respiratory distress.     Breath sounds: Normal breath sounds. No wheezing or rales.  Abdominal:     General: Bowel sounds are normal. There is no  distension.     Palpations: Abdomen is soft.     Tenderness: There is no abdominal tenderness.  Musculoskeletal:        General: No swelling or tenderness.     Cervical back: Normal range of motion and neck supple.  Skin:    General: Skin is warm and dry.  Neurological:     Mental Status: He is alert.     Comments: Right facial palsy   Addendum: Pitting edema of bilateral lower extremities present  Labs on Admission: I have personally reviewed following labs and imaging studies  CBC: Recent Labs  Lab 06/18/20 2047  WBC 7.7  NEUTROABS 6.0  HGB 10.4*  HCT 31.4*  MCV 90.2  PLT 484*   Basic Metabolic Panel: Recent Labs  Lab 06/18/20 2047  NA 129*  K 4.2  CL 91*  CO2 31  GLUCOSE 204*  BUN 19  CREATININE 0.79  CALCIUM 8.5*   GFR: CrCl cannot be calculated (Unknown  ideal weight.). Liver Function Tests: Recent Labs  Lab 06/18/20 2047  AST 16  ALT 14  ALKPHOS 75  BILITOT 0.6  PROT 5.8*  ALBUMIN 2.3*   No results for input(s): LIPASE, AMYLASE in the last 168 hours. No results for input(s): AMMONIA in the last 168 hours. Coagulation Profile: No results for input(s): INR, PROTIME in the last 168 hours. Cardiac Enzymes: No results for input(s): CKTOTAL, CKMB, CKMBINDEX, TROPONINI in the last 168 hours. BNP (last 3 results) No results for input(s): PROBNP in the last 8760 hours. HbA1C: No results for input(s): HGBA1C in the last 72 hours. CBG: Recent Labs  Lab 06/18/20 2249  GLUCAP 174*   Lipid Profile: No results for input(s): CHOL, HDL, LDLCALC, TRIG, CHOLHDL, LDLDIRECT in the last 72 hours. Thyroid Function Tests: No results for input(s): TSH, T4TOTAL, FREET4, T3FREE, THYROIDAB in the last 72 hours. Anemia Panel: No results for input(s): VITAMINB12, FOLATE, FERRITIN, TIBC, IRON, RETICCTPCT in the last 72 hours. Urine analysis: No results found for: COLORURINE, APPEARANCEUR, LABSPEC, PHURINE, GLUCOSEU, HGBUR, BILIRUBINUR, KETONESUR, PROTEINUR,  UROBILINOGEN, NITRITE, LEUKOCYTESUR  Radiological Exams on Admission: No results found.  EKG: Independently reviewed.  Sinus rhythm, borderline T wave abnormalities, borderline QT prolongation.  No prior tracing for comparison.  Assessment/Plan Principal Problem:   Mastoiditis Active Problems:   Osteomyelitis of skull (HCC)   Hypertensive urgency   Anemia   Insulin dependent type 2 diabetes mellitus (HCC)   Severe complicated bilateral otomastoiditis, skull base osteomyelitis No fever, tachycardia, leukocytosis, or lactic acidosis to suggest sepsis.  Per daughter, culture done at ENT office grew Pseudomonas.  CT of temporal bones with contrast done on 06/15/2020 which revealed findings concerning for severe bilateral otomastoiditis with associated skull base osteomyelitis with phlegmon and possible preclival abscess.  Likely thrombosis of the distal sigmoid dural venous sinuses bilaterally with narrowing of the internal jugular veins at the skull base by phlegmon.  Additionally there is suspected intracranial extension of phlegmon with soft tissue noted along the inferior petrous temporal bones bilaterally and possibly the dorsal aspect of the clivus. -ENT consulted and will plan on biopsy.  Keep patient n.p.o. until seen by ENT.  Continue cefepime per infectious disease recommendations.  Blood culture x2 pending.  Ordered brain MRI with and without contrast with inner ear protocol after discussion with on-call radiologist.  Hypertensive urgency -Continue lisinopril. IV hydralazine as needed.  Normocytic anemia Hemoglobin 10.4, MCV 90.2. -Anemia panel, FOBT  Mild hyponatremia Corrected sodium 131. -IV fluid hydration, continue to monitor  Insulin-dependent type 2 diabetes -Check A1c.  Continue home Levemir 7 units at bedtime.  Order sliding scale insulin sensitive.  Hypothyroidism -Continue Synthroid  Hyperlipidemia -Continue Zocor  Addendum: Notified by RN that patient noted  to have periods of apnea during sleep with oxygen saturation dropping into the 60s but quickly rising back to 100%.  He was placed on 2 L supplemental oxygen.  I saw the patient he was resting comfortably, satting 100% on 2 L with no signs of respiratory distress. He does have pitting edema of bilateral lower extremities which according to his daughter is chronic.  No documented history of CHF.  Chest x-ray showing low lung volumes with no acute abnormality.  He might have underlying sleep apnea and will need outpatient sleep study.  PE less likely given no hypoxia when awake or tachycardia, check D-dimer level.   DVT prophylaxis: SCDs Code Status: Full code-discussed with the patient and his daughter. Family Communication: Daughter updated. Disposition Plan: Status  is: Inpatient  Remains inpatient appropriate because:Inpatient level of care appropriate due to severity of illness   Dispo: The patient is from: Home              Anticipated d/c is to: Home              Patient currently is not medically stable to d/c.   Difficult to place patient No  Level of care: Level of care: Telemetry Medical   The medical decision making on this patient was of high complexity and the patient is at high risk for clinical deterioration, therefore this is a level 3 visit.  John GiovanniVasundhra Arath Kaigler MD [facility]  If 7PM-7AM, please contact night-coverage [email or web address]  06/19/2020, 1:17 AM

## 2020-06-19 NOTE — Progress Notes (Addendum)
Subjective: Patient admitted this morning, see detailed H&P by Dr Loney Loh 87 year old male with history of diabetes mellitus type 2, hypertension, hypothyroidism presented to ED with complaints of ear infection and dizziness.  Patient has history of chronic otitis externa and is followed by ENT.  Recently started having copious drainage from his ears which resulted in loss of hearing and also developed facial palsy.  He had CT of temporal bones with contrast on 06/15/2020 which showed finding for severe bilateral otomastoiditis with associated skull base osteomyelitis with phlegmon and possible 3 [place] abscess.  Likely thrombosis of distal sigmoid dural venous sinuses bilaterally with narrowing of interventricular veins at the skull base by phlegmon.    In the ED ENT and ID were consulted.  Patient started on vancomycin and cefepime.   MRI brain shows evidence of intracranial spread of infection with diffuse dural thickening and enhancement about the adjacent skull base.  2.4 cm collection at the central nasopharynx likely abscess.  MRI cervical spine showed extensive soft tissue enhancement below skull base.  This extends into C1-2 joint likely involved with infection and septic arthritis.  MRV head shows thrombus in the sigmoid sinus and jugular vein bilaterally compatible with venous sinus  thrombosis.  Vitals:   06/19/20 0850 06/19/20 1500  BP:  (!) 153/70  Pulse:  (!) 51  Resp:  14  Temp: 97.9 F (36.6 C) 98.3 F (36.8 C)  SpO2:  100%      A/P  Bilateral mastoiditis with intracranial extension -Started on vancomycin and cefepime -ID, ENT, neurosurgery consulted -No plans for surgical intervention at this time -Follow blood culture results   C1-C2 septic arthritis -Continue IV antibiotics as above -Neurosurgery has no plans for aggressive intervention   Venous sinus thrombosis -MRV shows thrombosis and sigmoid sinus and jugular vein bilaterally compatible with venous  sinus thrombosis -We will consult neurology to see if patient will need anticoagulation  Hypertension -Continue lisinopril  Diabetes mellitus type 2 -Continue Levemir 7 units subcut daily -Sliding scale insulin with NovoLog  Hypomagnesemia -Magnesium is 1.4 -Replace magnesium and check serum magnesium level in a.m.  Goals of care -Patient has extremely poor prognosis -Discussed in detail with neurosurgeon Dr. Jake Samples, he recommends getting palliative care consult involved for further clarification of goals of care   Time spent 55 minutes   Meredeth Ide [facility] [phone]

## 2020-06-19 NOTE — ED Notes (Addendum)
Pt experiencing periods of apnea during sleep. O2 saturations dropping into the 60's for short periods of time and quickly rise to back to 100%. Pt placed on 2L [place] while sleeping. MD notified.

## 2020-06-19 NOTE — Consult Note (Addendum)
I have seen and examined the patient. I have personally reviewed the clinical findings, laboratory findings, microbiological data and imaging studies. The assessment and treatment plan was discussed with the  Advance Practice Provider, Mauricio Po  I agree with her/his recommendations except following additions/corrections.  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 History is obtained from patient's daughter and chart review given patient has severe hearing loss.  He has been seen by multiple ENT doctor since August 2021 and has been on several course of antibiotics per daughter when he was living in [place]. Marland Kitchen He is s/p rt myringotomy tube placement in Jan 2022, developed rt facial weakness in February 2022 and was diagnosed with Bell's palsy. He also had left myringotomy tube placement in 03/2020 for middle ear effusion. He started following with Dr Wilburn Cornelia ( ENT) in early April after his daughter brought him to [place]. He was given a course of Augmentin and ciprodex drop for 2 weeks when seen in the office on 4/8 but continued to have bilateral otorrhea, rt facial weakness. Ear culture 5/11 grew Pseudomonas aeruginosa R to Fluoroquinolones.   Daughter denies any known immunocompromising conditions or drug use. He used to work as a Producer, television/film/video. Lives by himself at [place].   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 Possibility of Nasopharyngeal carcinoma - biopsy recommended  Likely thrombosis of the distal sigmoid dural venous sinuses bilaterally with narrowing of the Internal jugular  veins at the skull base by phlegmon   Suspected intracranial extension of phlegmon with soft tissue noted along the inferior petrous temporal bones bilaterally and possibly the dorsal aspect of the clivus.  Exam - lying in bed with no acute distress              PERRLA              RT eye redness. Loss of rt nasolabial fold               Able to shrug shoulders               Sensation bilaterally intact              Power 5/ in both UE and LE  Skin - no rashes, lesions' CVS/Chest - normal heart and lung sounds Abdomen - soft, non tender and non distended   Plan  Continue cefepime - will need a prolonged IV antibiotics at least 6-8 weeks or even longer given Pseudomonas is Resistant to Fluoroquiniolones ENT has been consulted already +/- surgical intervention  I will order MRV of head ( discussed with radiologist to r/o dural venous thrombosis) and MRI c spine  ( neck pain) I also recommend to consult Neuro SX given imaging findings concerning for intra-cranial spread of infection Will need a PICC line if blood cultures 5/30 are negative in 48 hrs  Check a1c Monitor CBC and BMP Baseline ESR and CRP   [name], MD [facility]    Date of Admission:  06/18/2020     Total days  of antibiotics 2               Reason for Consult: Osteomyelitis    Referring Provider: [place]  Primary Care Provider: Pcp, No   ASSESSMENT:  Mr. Champine is an 87 y/o gentleman with recent history of recurrent ear infections s/p bilateral myringotomy tube placement and multiple courses of antibiotics admitted with Pseudomonas otomastoiditis and base of the skull osteomyelitis with abscess. ENT evaluation and possible surgical intervention pending. Blood cultures are without growth in <12 hours and currently on Day 2 of cefepime. Will require prolonged course of antimicrobial therapy with duration to be  determined by any surgical interventions. Monitor blood cultures for bacteremia.   PLAN:  1. Continue Cefepime.  2. Await ENT evaluation for surgical intervention 3. Monitor blood cultures for presence of bacteremia.   Principal Problem:   Mastoiditis Active Problems:   Osteomyelitis of skull (HCC)   Pseudomonas aeruginosa infection   Hypertensive urgency   Anemia   Insulin dependent type 2 diabetes mellitus ([place])   . brimonidine  1 drop Right Eye BID  . insulin aspart  0-5 Units Subcutaneous QHS  . insulin aspart  0-9 Units Subcutaneous TID WC  . insulin detemir  7 Units Subcutaneous QHS  . levothyroxine  88 mcg Oral Q0600  . lisinopril  2.5 mg Oral QHS  . simvastatin  40 mg Oral QHS  . timolol  1 drop Right Eye BID     HPI: Caedon Bond is a 87 y.o. male with previous medical history of Type 2 insulin dependent diabetes, hypertension and hypothyroidism admitted with abnormal CT findings concerning for osteomyelitis of the skull base and phlegmon.  Mr. Wilbert began having recurrent ear infection in August of 2021 that was treated with antibiotics and is s/p myringotomy tube placement while living in [place]. Course was complicated by development of facial paralysis diagnosed as Bell's Palsy following normal MRI in February 2022. Initially seen by ENT on 4/8 with bilateral copious otorrhea, right facial weakness and clear nasal drainage. Findings without significant purulent drainage. Started on Ciprodex and Augmentin for 2 weeks. On follow up on 5/10 had continued bilateral otorrhea and right facial weakness. Cultures obtained and CT temporal bones ordered. Cultures were positive for Pseudomonas as seen below.     CT temporal bones with large bilateral mastoid effusions and middle ear fluid with osseous erosive change of the temporal bones and skull base with extensive masslike soft tissue thickening involving the posterior nasopharynx and severe bilateral otomastoiditis with  associated skull base osteomyelitis with phlegmon and possible preclival abscess. Additionally there is suspected intracranial extension of the phlegmon with soft tissue along the inferior petrious temporal bone bilaterally and dorsal aspect of the clivus.   Mr. Cuadra has been afebrile since arrival with WBC count of 7.7 with creatinine of 0.9. Blood cultures are pending. ENT consulted with plan for biopsy. Currently on Day 2 of cefepime with Pseudomonas aeruginosa resistant to ciprofloxacin. Peripheral line was removed secondary to discomfort. Mr. Worthington is currently deaf and was about 15% hearing with hearing aids prior to February. Currently communicating with written notes.   Review of Systems: Review of Systems  Constitutional: Negative for chills, fever and weight loss.  Respiratory: Negative for cough, shortness of breath and wheezing.   Cardiovascular: Negative for chest pain and leg swelling.  Gastrointestinal: Negative for abdominal pain, constipation, diarrhea, nausea and vomiting.  Skin: Negative for rash.     Past Medical History:  Diagnosis Date  .  Diabetes mellitus without complication ([place])   . Hypertension   . Thyroid disease     Social History   Tobacco Use  . Smoking status: Never Smoker  . Smokeless tobacco: Never Used  Substance Use Topics  . Alcohol use: Never  . Drug use: Never    History reviewed. No pertinent family history.  No Known Allergies  OBJECTIVE: Blood pressure (!) 158/66, pulse (!) 53, temperature 97.9 F (36.6 C), temperature source Oral, resp. rate (!) 9, height _0  (1.803 m), weight 73.9 kg, SpO2 100 %.  Physical Exam Constitutional:      General: He is not in acute distress.    Appearance: He is well-developed.  Cardiovascular:     Rate and Rhythm: Normal rate and regular rhythm.     Heart sounds: Normal heart sounds.  Pulmonary:     Effort: Pulmonary effort is normal.     Breath sounds: Normal breath sounds.  Skin:     General: Skin is warm and dry.  Neurological:     Mental Status: He is alert and oriented to person, place, and time.  Psychiatric:        Behavior: Behavior normal.        Thought Content: Thought content normal.        Judgment: Judgment normal.     Lab Results Lab Results  Component Value Date   WBC 8.9 06/19/2020   HGB 11.2 (L) 06/19/2020   HCT 35.2 (L) 06/19/2020   MCV 92.1 06/19/2020   PLT 489 (H) 06/19/2020    Lab Results  Component Value Date   CREATININE 0.70 06/19/2020   BUN 17 06/19/2020   NA 128 (L) 06/19/2020   K 3.9 06/19/2020   CL 91 (L) 06/19/2020   CO2 25 06/19/2020    Lab Results  Component Value Date   ALT 14 06/18/2020   AST 16 06/18/2020   ALKPHOS 75 06/18/2020   BILITOT 0.6 06/18/2020     Microbiology: Recent Results (from the past 240 hour(s))  Blood culture (routine x 2)     Status: None (Preliminary result)   Collection Time: 06/18/20  8:45 PM   Specimen: BLOOD RIGHT ARM  Result Value Ref Range Status   Specimen Description BLOOD RIGHT ARM  Final   Special Requests   Final    BOTTLES DRAWN AEROBIC AND ANAEROBIC Blood Culture results may not be optimal due to an excessive volume of blood received in culture bottles   Culture   Final    NO GROWTH < 12 HOURS Performed at [facility], [place] [address], [place], [place] [address]    Report Status PENDING  Incomplete  Blood culture (routine x 2)     Status: None (Preliminary result)   Collection Time: 06/18/20  8:53 PM   Specimen: BLOOD LEFT ARM  Result Value Ref Range Status   Specimen Description BLOOD LEFT ARM  Final   Special Requests   Final    BOTTLES DRAWN AEROBIC ONLY Blood Culture results may not be optimal due to an excessive volume of blood received in culture bottles   Culture   Final    NO GROWTH < 12 HOURS Performed at [facility], [place] [address], [place], [place] [address]    Report Status PENDING  Incomplete  Resp Panel by RT-PCR (Flu A&B, Covid)  Nasopharyngeal Swab     Status: None   Collection Time: 06/18/20 10:49 PM   Specimen: Nasopharyngeal Swab; Nasopharyngeal(NP) swabs in  vial transport medium  Result Value Ref Range Status   SARS Coronavirus 2 by RT PCR NEGATIVE NEGATIVE Final    Comment: (NOTE) SARS-CoV-2 target nucleic acids are NOT DETECTED.  The SARS-CoV-2 RNA is generally detectable in upper respiratory specimens during the acute phase of infection. The lowest concentration of SARS-CoV-2 viral copies this assay can detect is 138 copies/mL. A negative result does not preclude SARS-Cov-2 infection and should not be used as the sole basis for treatment or other patient management decisions. A negative result may occur with  improper specimen collection/handling, submission of specimen other than nasopharyngeal swab, presence of viral mutation(s) within the areas targeted by this assay, and inadequate number of viral copies(<138 copies/mL). A negative result must be combined with clinical observations, patient history, and epidemiological information. The expected result is Negative.  Fact Sheet for Patients:  EntrepreneurPulse.com.au  Fact Sheet for Healthcare Providers:  IncredibleEmployment.be  This test is no t yet approved or cleared by the [place] FDA and  has been authorized for detection and/or diagnosis of SARS-CoV-2 by FDA under an Emergency Use Authorization (EUA). This EUA will remain  in effect (meaning this test can be used) for the duration of the COVID-19 declaration under Section 564(b)(1) of the Act, 21 U.S.C.section 360bbb-3(b)(1), unless the authorization is terminated  or revoked sooner.       Influenza A by PCR NEGATIVE NEGATIVE Final   Influenza B by PCR NEGATIVE NEGATIVE Final    Comment: (NOTE) The Xpert Xpress SARS-CoV-2/FLU/RSV plus assay is intended as an aid in the diagnosis of influenza from Nasopharyngeal swab specimens and should not be  used as a sole basis for treatment. Nasal washings and aspirates are unacceptable for Xpert Xpress SARS-CoV-2/FLU/RSV testing.  Fact Sheet for Patients: EntrepreneurPulse.com.au  Fact Sheet for Healthcare Providers: IncredibleEmployment.be  This test is not yet approved or cleared by the [place] FDA and has been authorized for detection and/or diagnosis of SARS-CoV-2 by FDA under an Emergency Use Authorization (EUA). This EUA will remain in effect (meaning this test can be used) for the duration of the COVID-19 declaration under Section 564(b)(1) of the Act, 21 U.S.C. section 360bbb-3(b)(1), unless the authorization is terminated or revoked.  Performed at [facility], [place] [address] [place], [place] [address]    Pertinent Imagings CT temporal bone 06/15/20 IMPRESSION: 1. Large bilateral mastoid effusions and middle ear fluid with osseous erosive change of the temporal bones and skull base with extensive masslike soft tissue thickening involving the posterior nasopharynx and skull base with encasement of the ICAs and jugular veins and preclival 2.6 cm fluid collection. 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 Nasopharyngeal carcinoma is possible, although the extent of abnormality is atypical. Recommend biopsy. An MRI with contrast could further characterize the extent of abnormality and better evaluate for intracranial spread of infection if clinically indicated. 2. Likely thrombosis of the distal sigmoid dural venous sinuses bilaterally with narrowing of the internal jugular veins at the skull base by phlegmon. MRI with contrast could better evaluate the dural venous sinuses as well.  Additionally, there is suspected intracranial extension of phlegmon with soft tissue noted along the  inferior petrous temporal bones bilaterally and possibly the dorsal aspect of the clivus.  MRI brain WWO Contrast 06/19/20 IMPRESSION: 1. Findings consistent with acute bilateral otomastoiditis with otitis externa with extensive infiltrative involvement of the skull base and upper  neck, with associated skull base osteomyelitis. 2. Evidence for mild and/or early intracranial spread of infection with diffuse dural thickening and enhancement about the adjacent skull base. No MRI evidence for frank encephalitis or intracranial abscess. 3. 2.4 cm pre clival collection at the central nasopharynx, likely abscess. Additional smaller 6 mm abscess adjacent to the right sigmoid sinus as above. 4. Encasement of the distal cervical ICAs by the extensive infiltrative infection throughout the skull base without evidence for vascular occlusion or stroke at this time. No definite evidence for dural venous sinus thrombosis on this exam as previously questioned, although follow-up examination with dedicated MRV would be more sensitive for evaluation of this potential complication.  Chest Xray 06/19/20 ( personally reviewed) FINDINGS: Mediastinum hilar structures normal. Heart size normal. Low lung volumes. No focal infiltrate. No pleural effusion or pneumothorax. Degenerative change thoracic spine.  IMPRESSION: Low lung volumes.  No acute abnormality identified.  Terri Piedra, NP [facility]  06/19/2020  10:29 AM

## 2020-06-19 NOTE — Consult Note (Signed)
   Providing Compassionate, Quality Care - Together  Neurosurgery Consult  Referring physician: Dr. Sharl Ma Reason for referral: Skullbase osteomyelitis  Chief Complaint: Headaches, bilateral ear infection  History of Present Illness: This is an 87 year old male with a history of diabetes, hypertension, arthritis, bilateral ear infections since August 20 placed admitted for headaches and disequilibrium and vomiting over the weekend.  He also was noted to have both COVID which was diagnosed in February 2022 right.  He has had undergone a tube placement in January 2022.  He has been treated for bilateral ear infections since August 2021, originally in [place] where he resides.  He has had bilateral otorrhea, right facial weakness and ultimately progressive bilateral hearing loss which at this point is complete.  He does Doctor, general practice.  His daughter is at bedside.  He complains of generalized fatigue.   Medications: I have reviewed the patient's current medications. Allergies: No Known Allergies  History reviewed. No pertinent family history. Social History:  has no history on file for tobacco use, alcohol use, and drug use.  ROS: 14 point review of systems obtained which all positives and negatives are listed in HPI above  Physical Exam:  Vital signs in last 24 hours: Temp:  [98 F (36.7 C)-98.3 F (36.8 C)] 98 F (36.7 C) (07/25 1814) Pulse Rate:  [58-128] 65 (07/26 0746) Resp:  [11-18] 14 (07/26 0217) BP: (138-182)/(65-125) 153/88 (07/26 0700) SpO2:  [91 %-98 %] 96 % (07/26 0746) PE: Awake, alert, communicates via whiteboard No acute distress, normocephalic atraumatic PERRL Right facial droop EOMI Moves all extremities symmetrically full strength Bilateral hearing loss Sensory intact to light touch throughout   Impression/Assessment:  87 year old male with  1.  Skull base osteomyelitis/mastoiditis phlegmon 2.  Bilateral venous sinus thrombosis, sigmoid sinus and  Jugular vein 3.  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  Please do not hesitate to call with questions or concerns.   Monia Pouch, DO Neurosurgeon [facility] Cell: [phone]

## 2020-06-20 ENCOUNTER — Inpatient Hospital Stay (HOSPITAL_COMMUNITY): Payer: Medicare Other

## 2020-06-20 DIAGNOSIS — H7093 Unspecified mastoiditis, bilateral: Secondary | ICD-10-CM | POA: Diagnosis not present

## 2020-06-20 DIAGNOSIS — G08 Intracranial and intraspinal phlebitis and thrombophlebitis: Secondary | ICD-10-CM

## 2020-06-20 DIAGNOSIS — H7090 Unspecified mastoiditis, unspecified ear: Secondary | ICD-10-CM | POA: Diagnosis not present

## 2020-06-20 DIAGNOSIS — M869 Osteomyelitis, unspecified: Secondary | ICD-10-CM | POA: Diagnosis not present

## 2020-06-20 LAB — COMPREHENSIVE METABOLIC PANEL
ALT: 12 U/L (ref 0–44)
AST: 15 U/L (ref 15–41)
Albumin: 2 g/dL — ABNORMAL LOW (ref 3.5–5.0)
Alkaline Phosphatase: 61 U/L (ref 38–126)
Anion gap: 8 (ref 5–15)
BUN: 14 mg/dL (ref 8–23)
CO2: 31 mmol/L (ref 22–32)
Calcium: 8.1 mg/dL — ABNORMAL LOW (ref 8.9–10.3)
Chloride: 90 mmol/L — ABNORMAL LOW (ref 98–111)
Creatinine, Ser: 0.73 mg/dL (ref 0.61–1.24)
GFR, Estimated: >60 mL/min (ref >60)
Glucose, Bld: 94 mg/dL (ref 70–99)
Potassium: 3.5 mmol/L (ref 3.5–5.1)
Sodium: 129 mmol/L — ABNORMAL LOW (ref 135–145)
Total Bilirubin: 0.4 mg/dL (ref 0.3–1.2)
Total Protein: 5 g/dL — ABNORMAL LOW (ref 6.5–8.1)

## 2020-06-20 LAB — GLUCOSE, CAPILLARY
Glucose-Capillary: 102 mg/dL — ABNORMAL HIGH (ref 70–99)
Glucose-Capillary: 109 mg/dL — ABNORMAL HIGH (ref 70–99)
Glucose-Capillary: 136 mg/dL — ABNORMAL HIGH (ref 70–99)
Glucose-Capillary: 144 mg/dL — ABNORMAL HIGH (ref 70–99)

## 2020-06-20 LAB — CBC
HCT: 30.3 % — ABNORMAL LOW (ref 39.0–52.0)
Hemoglobin: 10.1 g/dL — ABNORMAL LOW (ref 13.0–17.0)
MCH: 29.5 pg (ref 26.0–34.0)
MCHC: 33.3 g/dL (ref 30.0–36.0)
MCV: 88.6 fL (ref 80.0–100.0)
Platelets: 456 10*3/uL — ABNORMAL HIGH (ref 150–400)
RBC: 3.42 MIL/uL — ABNORMAL LOW (ref 4.22–5.81)
RDW: 13.2 % (ref 11.5–15.5)
WBC: 6.2 10*3/uL (ref 4.0–10.5)
nRBC: 0 % (ref 0.0–0.2)

## 2020-06-20 LAB — HEMOGLOBIN A1C
Hgb A1c MFr Bld: 8.7 % — ABNORMAL HIGH (ref 4.8–5.6)
Mean Plasma Glucose: 203 mg/dL

## 2020-06-20 LAB — MAGNESIUM: Magnesium: 2 mg/dL (ref 1.7–2.4)

## 2020-06-20 LAB — HEPARIN LEVEL (UNFRACTIONATED)
Heparin Unfractionated: <0.1 IU/mL — ABNORMAL LOW (ref 0.30–0.70)
Heparin Unfractionated: 0.23 IU/mL — ABNORMAL LOW (ref 0.30–0.70)

## 2020-06-20 IMAGING — CT CT VENOGRAM HEAD
2 of 11 series · 8 of 47 positions shown · IV contrast (Omni 300)
Comparison: None.

CLINICAL DATA: Dural venous sinus thrombosis suspected.  Infection.

EXAM:
CT VENOGRAM HEAD
TECHNIQUE: Venographic phase images of the head were obtained following the
administration of intravenous contrast agent. A noncontrast head CT
was performed prior to the administration of IV contrast.
CONTRAST:  50mL OMNIPAQUE IOHEXOL 350 MG/ML SOLN

[Series 4: head bone · axial · 0.41mm/px · z∈[-12,+48]mm · 3 of 61 slices shown]
[im 16/61  bone]
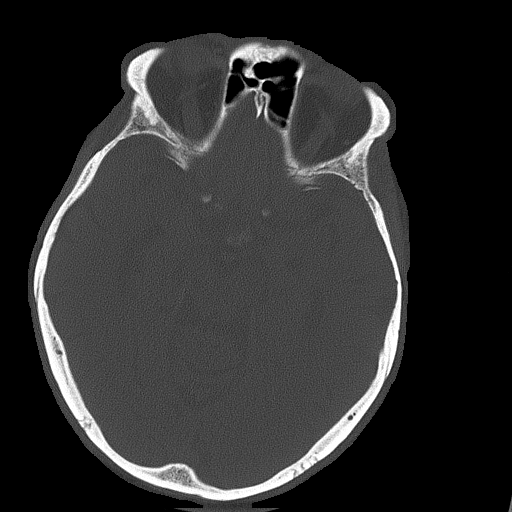
[im 31/61  bone]
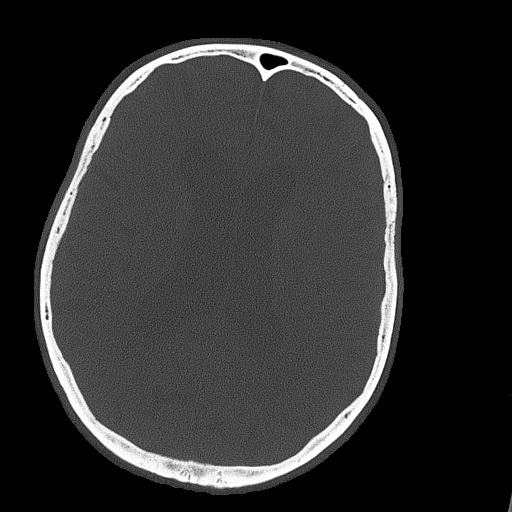
[im 46/61  bone]
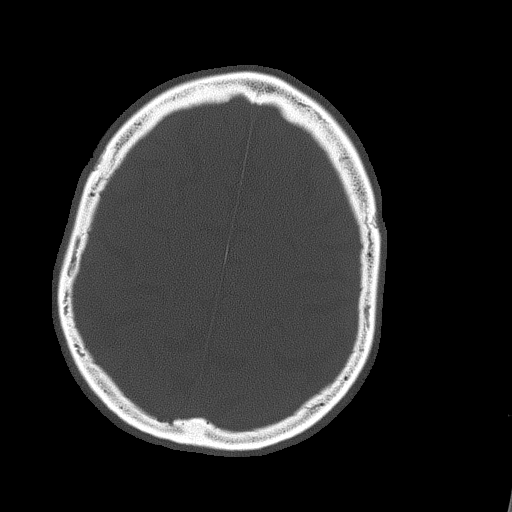

[Series 7: head with · axial · 0.41mm/px · z∈[-54,+58]mm · 5 of 85 slices shown]
[im 15/85  brain]
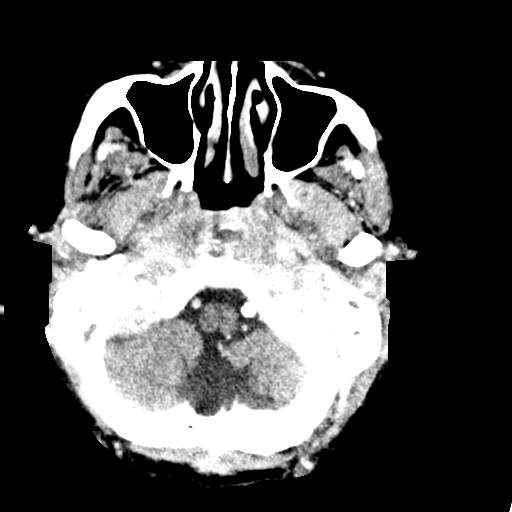
[im 29/85  bone]
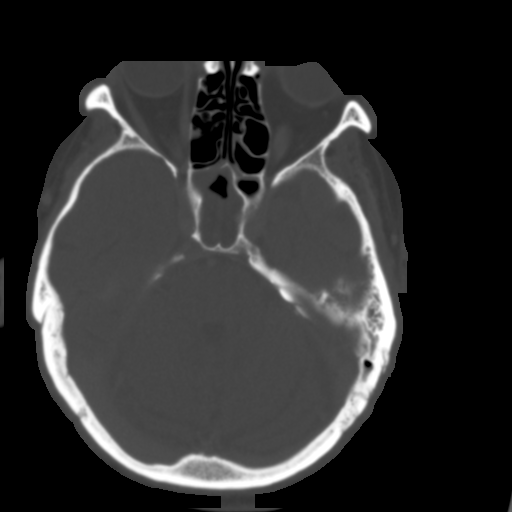
[im 43/85  brain]
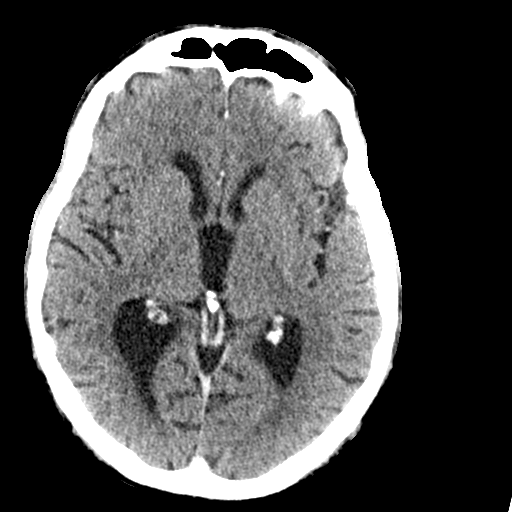
[im 57/85  bone]
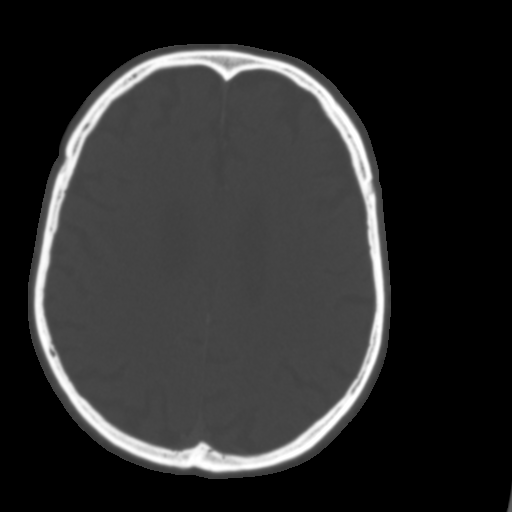
[im 71/85  brain]
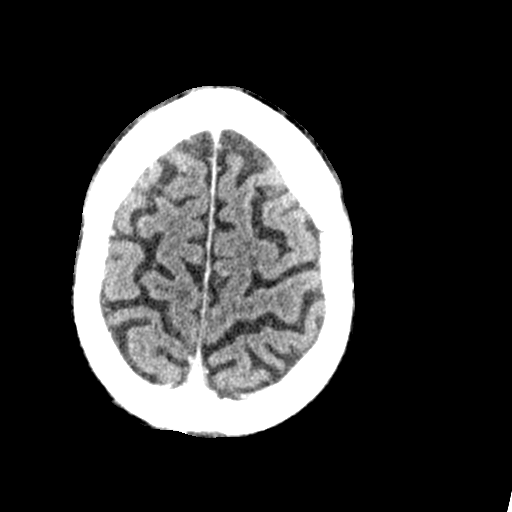

[8 of 47 positions shown; findings below may reference images not displayed]

FINDINGS: CT HEAD FINDINGS

Brain: There is no mass, hemorrhage or extra-axial collection. There
is generalized atrophy without lobar predilection. Hypodensity of
the white matter is most commonly associated with chronic
microvascular disease.

Vascular: Atherosclerotic calcification of the vertebral and
internal carotid arteries at the skull base. No abnormal
hyperdensity of the major intracranial arteries or dural venous
sinuses.

Skull: Limited visualization of extracranial soft tissues. No skull
fracture.

Sinuses/Orbits: Opacification of the sphenoid sinuses and mastoid
air cells. Bilateral middle ear opacification. The orbits are
normal.

CT VENOGRAM FINDINGS

There is relatively poor enhancement of the dural venous sinuses on
this study. Superior sagittal sinus, straight sinus, transverse
sinuses, internal cerebral veins and vein of ERLAS are patent. The
right sigmoid sinus is expanded with some central hypoattenuation
which likely indicates thrombus. Assessment of the left sigmoid
sinus is more limited.
IMPRESSION: 1. Poor enhancement of the dural venous sinuses on this study, with
suspected thrombus within the right sigmoid sinus. Assessment of the
left sigmoid sinus is more limited.
2. Opacification of the sphenoid sinuses and mastoid air cells and
bilateral middle ear cavities.
3. Extensive soft tissue infection at the skull base is incompletely
visualized and better characterized on earlier MRI.

## 2020-06-20 MED ORDER — SODIUM CHLORIDE 0.9 % IV SOLN: INTRAVENOUS | Status: DC

## 2020-06-20 MED ORDER — REFRESH LACRI-LUBE OP OINT: 1.0000 "application " | TOPICAL_OINTMENT | OPHTHALMIC | Status: DC | PRN

## 2020-06-20 MED ORDER — HEPARIN (PORCINE) 25000 UT/250ML-% IV SOLN
1250.0000 [IU]/h | INTRAVENOUS | Status: DC
  Administered 2020-06-20: 1000 [IU]/h via INTRAVENOUS
  Administered 2020-06-21 – 2020-06-23 (×2): 1200 [IU]/h via INTRAVENOUS
  Filled 2020-06-20 (×6): qty 250

## 2020-06-20 MED ORDER — IOHEXOL 350 MG/ML SOLN
50.0000 mL | Freq: Once | INTRAVENOUS | Status: AC | PRN
  Administered 2020-06-20: 50 mL via INTRAVENOUS

## 2020-06-20 NOTE — Progress Notes (Addendum)
PROGRESS NOTE   Nicolas James  ZOX:[id number]    DOB: 04-13-1933    DOA: 06/18/2020  PCP: Pcp, No   I have briefly reviewed patients previous medical records in [facility].  Chief Complaint  Patient presents with  . Osteomyelitis     Brief Narrative:  87 year old male with past medical history significant for but not limited to DM, HTN, arthritis, hypothyroidism, bilateral hip and knee replacements, having issues with bilateral ear since August 2021 (ear pain/pressure like symptoms, chronic drainage) admitted on 5/30 due to symptoms of disequilibrium/headache and vomiting.  Patient has been seen by multiple ENT doctors since August 2021 and had been on several courses of antibiotics while living in [place].  S/p right myringotomy tube placement January 2022, developed right facial weakness in February 2022 and was diagnosed with Bell's palsy.  Also underwent left myringotomy tube placement in 03/2020 for middle ear effusion.  Since he was doing poorly by himself in [place], family brought him to [place].  He started following with Dr. Annalee Genta, ENT in early April.  He was given a course of Augmentin and Ciprodex drops for 2 weeks and when seen in office on 4/8, continued to have bilateral otorrhea and right facial weakness.  Ear culture 5/11 grew Pseudomonas aeruginosa resistant to fluoroquinolones.  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  Prior used hearing aids.  Since hospital admission, evaluated by multiple consultants (ENT, ID, neurosurgery, neurology).  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  No surgical intervention or invasive procedures planned.  Palliative care consulted and input pending.  If aggressive care plan, will need a PICC line and prolonged course of IV antibiotics.  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  ID follow-up appreciated and recommend that if after GOC discussion with PMD, patient decides to pursue aggressive care then he will need long-term IV cefepime for at least 6 to 8 weeks or even longer.  PICC line decision pending PMD meeting with family.  PMT consulted but daughter wishes to hold off until she has had a chance to communicate with the patient which has been difficult since he is extremely hard of hearing and all communication has to be via writing.  As per neurology follow-up, recommend that following 3 days of being stable on heparin, recommend transitioning to oral anticoagulation with Eliquis for treatment of cerebral venous sinus thrombosis.  Duration of anticoagulation undetermined at this time and will need to be based on future imaging in the outpatient setting.  Will need follow-up imaging with MRV head with and without contrast in 2 to 4 months with outpatient neurology.  Close to time of discharge, will need to reach out to neurology so an ambulatory referral to for neurology follow-up can be placed.  As per RN report, patient not taking much orally.  Currently on clear liquids only.  Swallow eval to ensure safe advancement of diet.  Gentle IV fluids overnight and reassess in a.m.  Essential hypertension: Reasonably controlled on  lisinopril, continue  Type II DM: Reasonably controlled on Levemir 7 units at bedtime and SSI, continue  Hypomagnesemia: Replaced.  Hyponatremia:?  SIADH.  Stable.  Clinically euvolemic.  Trend  BMP.  Anemia of chronic disease: Stable.  Diplopia, bilateral deafness, facial nerve palsy: All related to above infectious etiology and management as above.  Failure to thrive/goals of care: As per multiple consultants, poor overall prognosis.  After discussing with daughter at bedside, requested palliative care consultation.  She wishes to hold off until she has had a chance to communicate with patient.   Body mass index is 22.72 kg/m.   DVT prophylaxis: SCDs Start: 06/19/20 0109.  Currently on IV heparin drip.   Code Status: Full Code Family Communication: Discussed in detail with patient's daughter at bedside, updated care and answered all questions Disposition:  Status is: Inpatient  Remains inpatient appropriate because:Inpatient level of care appropriate due to severity of illness   Dispo: The patient is from: Home              Anticipated d/c is to: Home              Patient currently is not medically stable to d/c.       Consultants:   ENT Infectious disease Neurosurgery Neurology Palliative care medicine  Procedures:   None  Antimicrobials:    Anti-infectives (From admission, onward)   Start     Dose/Rate Route Frequency Ordered Stop   06/18/20 2245  ceFEPIme (MAXIPIME) 2 g in sodium chloride 0.9 % 100 mL IVPB        2 g 200 mL/hr over 30 Minutes Intravenous Every 8 hours 06/18/20 2234          Subjective:  Patient is completely deaf as per daughter at bedside.  Daughter mostly spoke for him.  He did not really have much to say after she asked him by writing.  Intermittent diplopia ongoing since November 2021.  Intermittent neck/back of head pain.  No other complaints reported.  Patient reporting that he wants to return to [place] but daughter states that the family would make sure that he stays locally for ongoing medical care.  Objective:   Vitals:   06/19/20 0900 06/19/20 1500 06/19/20 2004 06/20/20 0347  BP:  (!) 153/70 (!) 160/79 (!) 154/70   Pulse:  (!) 51 (!) 53 62  Resp:  14 18 18   Temp:  98.3 F (36.8 C) (!) 97.4 F (36.3 C) 97.7 F (36.5 C)  TempSrc:  Oral Oral Oral  SpO2:  100% 98% 93%  Weight: 73.9 kg     Height: 5\' 11"  (1.803 m)       General exam: Elderly male, moderately built and nourished, lying propped up in bed with Pirates patch over his right eye and reading normal. Respiratory system: Clear to auscultation. Respiratory effort normal. Cardiovascular system: S1 & S2 heard, RRR. No JVD, murmurs, rubs, gallops or clicks. No pedal edema. Gastrointestinal system: Abdomen is nondistended, soft and nontender. No organomegaly or masses felt. Normal bowel sounds heard. Central nervous system: Alert and oriented.  Right facial droop.  Bilateral deafness.  Did not evaluate EOM movements. Extremities: Symmetric 5 x 5 power. Skin: No rashes, lesions or ulcers Psychiatry: Judgement and insight could not be adequately assessed due to deafness. Mood & affect appropriate.     Data Reviewed:   I have personally reviewed following labs and imaging studies   CBC: Recent Labs  Lab 06/18/20 2047 06/19/20 0224 06/20/20 06/21/20  WBC 7.7 8.9 6.2  NEUTROABS 6.0  --   --   HGB 10.4* 11.2* 10.1*  HCT 31.4* 35.2* 30.3*  MCV 90.2 92.1 88.6  PLT 484* 489* 456*    Basic Metabolic Panel: Recent Labs  Lab 06/18/20 2047 06/19/20 0224 06/19/20 0929 06/20/20 0923  NA 129* 128*  --  129*  K 4.2 3.9  --  3.5  CL 91* 91*  --  90*  CO2 31 25  --  31  GLUCOSE 204* 223*  --  94  BUN 19 17  --  14  CREATININE 0.79 0.70  --  0.73  CALCIUM 8.5* 8.4*  --  8.1*  MG  --   --  1.4* 2.0    Liver Function Tests: Recent Labs  Lab 06/18/20 2047 06/20/20 0923  AST 16 15  ALT 14 12  ALKPHOS 75 61  BILITOT 0.6 0.4  PROT 5.8* 5.0*  ALBUMIN 2.3* 2.0*    CBG: Recent Labs  Lab 06/19/20 2225 06/20/20 0751 06/20/20 1133  GLUCAP 107* 102* 109*    Microbiology Studies:   Recent Results (from the past 240 hour(s))   Blood culture (routine x 2)     Status: None (Preliminary result)   Collection Time: 06/18/20  8:45 PM   Specimen: BLOOD RIGHT ARM  Result Value Ref Range Status   Specimen Description BLOOD RIGHT ARM  Final   Special Requests   Final    BOTTLES DRAWN AEROBIC AND ANAEROBIC Blood Culture results may not be optimal due to an excessive volume of blood received in culture bottles   Culture   Final    NO GROWTH 2 DAYS Performed at [facility], [address] [place], [place] [address]    Report Status PENDING  Incomplete  Blood culture (routine x 2)     Status: None (Preliminary result)   Collection Time: 06/18/20  8:53 PM   Specimen: BLOOD LEFT ARM  Result Value Ref Range Status   Specimen Description BLOOD LEFT ARM  Final   Special Requests   Final    BOTTLES DRAWN AEROBIC ONLY Blood Culture results may not be optimal due to an excessive volume of blood received in culture bottles   Culture   Final    NO GROWTH 2 DAYS Performed at [facility], [address], [place], [place] [address]    Report Status PENDING  Incomplete  Resp Panel by RT-PCR (Flu A&B, Covid) Nasopharyngeal Swab     Status: None   Collection Time: 06/18/20 10:49 PM   Specimen: Nasopharyngeal Swab; Nasopharyngeal(NP) swabs in vial transport medium  Result Value Ref Range Status   SARS Coronavirus 2 by RT PCR NEGATIVE NEGATIVE Final    Comment: (NOTE) SARS-CoV-2 target nucleic acids are NOT DETECTED.  The SARS-CoV-2 RNA is generally detectable in upper respiratory specimens during the acute phase of infection. The lowest concentration of SARS-CoV-2 viral copies this assay can detect is 138 copies/mL. A negative result does not preclude SARS-Cov-2 infection and should not be used as the sole basis for treatment or other patient management decisions. A negative result may occur with  improper specimen collection/handling, submission of specimen other than nasopharyngeal swab, presence of viral  mutation(s) within the areas targeted by this assay, and inadequate number of viral copies(<138 copies/mL). A negative result must be combined with clinical observations, patient history, and epidemiological information. The expected result is Negative.  Fact Sheet for Patients:  BloggerCourse.com  Fact Sheet for  Healthcare Providers:  SeriousBroker.it  This test is no t yet approved or cleared by the [place] and  has been authorized for detection and/or diagnosis of SARS-CoV-2 by FDA under an Emergency Use Authorization (EUA). This EUA will remain  in effect (meaning this test can be used) for the duration of the COVID-19 declaration under Section 564(b)(1) of the Act, 21 U.S.C.section 360bbb-3(b)(1), unless the authorization is terminated  or revoked sooner.       Influenza A by PCR NEGATIVE NEGATIVE Final   Influenza B by PCR NEGATIVE NEGATIVE Final    Comment: (NOTE) The Xpert Xpress SARS-CoV-2/FLU/RSV plus assay is intended as an aid in the diagnosis of influenza from Nasopharyngeal swab specimens and should not be used as a sole basis for treatment. Nasal washings and aspirates are unacceptable for Xpert Xpress SARS-CoV-2/FLU/RSV testing.  Fact Sheet for Patients: BloggerCourse.com  Fact Sheet for Healthcare Providers: SeriousBroker.it  This test is not yet approved or cleared by the [place] FDA and has been authorized for detection and/or diagnosis of SARS-CoV-2 by FDA under an Emergency Use Authorization (EUA). This EUA will remain in effect (meaning this test can be used) for the duration of the COVID-19 declaration under Section 564(b)(1) of the Act, 21 U.S.C. section 360bbb-3(b)(1), unless the authorization is terminated or revoked.  Performed at [facility], [address] [place], [place] [address]      Radiology Studies:  MR BRAIN  W WO CONTRAST  Result Date: 06/19/2020 CLINICAL DATA:  Initial evaluation for acute otitis media with skull base infection. EXAM: MRI HEAD WITHOUT AND WITH CONTRAST TECHNIQUE: Multiplanar, multiecho pulse sequences of the brain and surrounding structures were obtained without and with intravenous contrast. CONTRAST:  86mL GADAVIST GADOBUTROL 1 MMOL/ML IV SOLN COMPARISON:  Recent temporal bone CT from 06/15/2020. FINDINGS: Brain: Cerebral volume within normal limits for age. Minimal scattered T2/FLAIR hyperintensity seen involving the supratentorial cerebral white matter, likely chronic microvascular ischemic disease, minimal for age. 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 No evidence for acute or subacute ischemia. No acute or chronic intracranial hemorrhage. No mass lesion, mass effect, or midline shift. No hydrocephalus. Again seen are large bilateral mastoid and middle ear effusions. Extensive abnormal infiltrative soft tissue density and thickening seen extending medially to involve the skull base including the petroclival regions, pre clival nasopharynx, with involvement of the jugular foramina bilaterally. Associated osseous erosion better appreciated on prior temporal bone CT. Area demonstrates diffuse heterogeneous signal abnormality with fairly avid postcontrast enhancement. Associated soft tissue swelling and thickening involves the visualized external auditory canals as well, with associated narrowing of both EACs. Constellation of findings consistent with extensive bilateral otitis media/otitis externa with associated infiltrative skull base infection. Heterogeneous marrow signal intensity within the clivus and petrous portions of both temporal bones likely reflect associated osteomyelitis. Pre clival nasopharyngeal collection again seen, measuring 2.4 x 0.9 cm (series 25, image 3). Additional probable small fluid collection seen dissecting medially from the right  mastoid air cells just above the sigmoid sinus and towards the right cerebellar pontine angle cistern, measuring approximately 6 x 5 mm (series 25, image 4). There is associated enhancing phlegmonous change along the posterior aspect of the clivus (series 25, image 3). Associated smooth dural thickening and enhancement throughout the adjacent skull base and along the inferior temporal convexities bilaterally, likely reflecting early intracranial spread of infection (series 26, image 7). This is also seen on axial images as well (series 25,  image 8). No parenchymal edema to suggest frank encephalitis within the brain itself. No visible evidence for spread of infection into either internal auditory canal. No other acute intracranial abnormality. Vascular: Encasement of the distal cervical ICAs and proximal internal jugular veins by the infiltrative phlegmonous change throughout the skull base again noted. Major arterial intracranial vascular flow voids are maintained without evidence for occlusion or other complication at this time. The major dural sinuses appear grossly patent as well, with no visible evidence for dural sinus thrombosis on this non MRV exam as previously questioned. Skull and upper cervical spine: Evidence for associated osteomyelitis involving the petrous temporal bones bilaterally and clivus. Additional involvement of the left greater than right occipital condyles as well as the lateral masses of C1 and dens. Additional diffuse edema seen throughout the soft tissues of the visualized upper neck (series 23, image 1). No other visible collections. Sinuses/Orbits: Patient status post bilateral ocular lens replacement. Globes and orbital soft tissues demonstrate no other acute finding. Evidence for concomitant/associated right sphenoid sinusitis. Additional mild mucosal thickening noted elsewhere within the paranasal sinuses. Other: None. IMPRESSION: 1. Findings consistent with acute bilateral  otomastoiditis with otitis externa with extensive infiltrative involvement of the skull base and upper neck, with associated skull base osteomyelitis. 2. Evidence for mild and/or early intracranial spread of infection with diffuse dural thickening and enhancement about the adjacent skull base. No MRI evidence for frank encephalitis or intracranial abscess. 3. 2.4 cm pre clival collection at the central nasopharynx, likely abscess. Additional smaller 6 mm abscess adjacent to the right sigmoid sinus as above. 4. Encasement of the distal cervical ICAs by the extensive infiltrative infection throughout the skull base without evidence for vascular occlusion or stroke at this time. No definite evidence for dural venous sinus thrombosis on this exam as previously questioned, although follow-up examination with dedicated MRV would be more sensitive for evaluation of this potential complication. Electronically Signed   By: Rise Mu M.D.   On: 06/19/2020 04:38   MR CERVICAL SPINE W WO CONTRAST  Result Date: 06/19/2020 CLINICAL DATA:  Neck pain.  Skull base infection. EXAM: MRI CERVICAL SPINE WITHOUT AND WITH CONTRAST TECHNIQUE: Multiplanar and multiecho pulse sequences of the cervical spine, to include the craniocervical junction and cervicothoracic junction, were obtained without and with intravenous contrast. CONTRAST:  7mL GADAVIST GADOBUTROL 1 MMOL/ML IV SOLN COMPARISON:  MRI head 06/19/2020 FINDINGS: Alignment: Normal Vertebrae: Negative for fracture. There is C1-2 arthropathy with joint effusion and synovial thickening and enhancement around the dens and anterior arch of C1 as well as posterior to the dens. There is extensive surrounding soft tissue enhancement below the skull base and anterior to C1 compatible with infection. Bony erosion of the skull base is best seen on recent CT. Cord: Cord evaluation is limited by motion. No cord signal abnormality identified. No epidural abscess identified.  Posterior Fossa, vertebral arteries, paraspinal tissues: Refer to MRI and MR venogram report from today. Briefly, there is extensive soft tissue swelling below the posterior skull base and clivus. There is dural thickening and enhancement posterior to the clivus. Bilateral mastoid effusion. Disc levels: Mild degenerative change in the cervical spine. Bilateral foraminal stenosis at C3-4, C4-5, and C5-6 due to disc and facet degeneration. IMPRESSION: Extensive soft tissue enhancement below the skull base. This extends into the C1-2 joint which is likely involved with infection and septic arthritis. Bony erosion of the skull base best seen on CT compatible with osteomyelitis. No epidural abscess in the cervical  spine. Dural thickening enhancement in the posterior fossa compatible with intracranial extension of infection. Electronically Signed   By: Marlan Palauharles  Clark M.D.   On: 06/19/2020 14:42   DG CHEST PORT 1 VIEW  Result Date: 06/19/2020 CLINICAL DATA:  Hypoxia. EXAM: PORTABLE CHEST 1 VIEW COMPARISON:  No prior. FINDINGS: Mediastinum hilar structures normal. Heart size normal. Low lung volumes. No focal infiltrate. No pleural effusion or pneumothorax. Degenerative change thoracic spine. IMPRESSION: Low lung volumes.  No acute abnormality identified. Electronically Signed   By: Maisie Fushomas  Register   On: 06/19/2020 05:28   MR MRV HEAD W WO CONTRAST  Result Date: 06/19/2020 CLINICAL DATA:  Acute neck pain. Infection. Skull base infection on CT. Rule out venous thrombosis. EXAM: MR VENOGRAM HEAD WITHOUT AND WITH CONTRAST TECHNIQUE: Angiographic images of the intracranial venous structures were acquired using MRV technique without and with intravenous contrast. CONTRAST:  7mL GADAVIST GADOBUTROL 1 MMOL/ML IV SOLN COMPARISON:  CT temporal bone 06/15/2020. MRI head with contrast 06/19/2020 FINDINGS: Superior sagittal sinus is widely patent. Straight sinus is widely patent. The transverse sinus is patent bilaterally.  There is filling defect on postcontrast images in the sigmoid sinus and jugular vein bilaterally compatible with venous thrombosis bilaterally. Postcontrast imaging reveals extensive enhancement and soft tissue thickening inferior to the posterior skull base and mastoid sinus bilaterally. There are bilateral mastoid effusion. There is extensive soft tissue swelling and enhancement of the external auditory canal and pinna bilaterally without abscess. There is extensive soft tissue thickening and enhancement of the posterior nasopharynx with central nonenhancing fluid collection measuring approximately 10 x 20 mm, similar to the prior study. Probable abscess. There is dural thickening and enhancement in the posterior fossa along the clivus and around the cerebellum anteriorly compatible with intracranial spread of infection. No intracranial fluid collection or abscess is identified. There is mild hydrocephalus, unchanged. Mild dural thickening and enhancement around both cerebral hemispheres also likely related to intracranial infection. There is mucosal edema and mucosal enhancement in the sphenoid sinus bilaterally. Remaining paranasal sinuses show mild mucosal edema. IMPRESSION: 1. There is thrombus in the sigmoid sinus and jugular vein bilaterally compatible with venous sinus thrombosis. 2. Extensive skull base infection. There is extensive soft tissue swelling and enhancement involving the posterior nasopharynx and soft tissues below the posterior skull base bilaterally. This appears centered around the mastoid sinus bilaterally which is diffusely fluid-filled and has extensive surrounding soft tissue enhancement. There is enhancement and thickening of the external auditory canal bilaterally. There is mucosal edema in the paranasal sinuses primarily in the sphenoid sinus. 3. There is dural thickening and enhancement in the posterior fossa and to lesser extent around both cerebral hemispheres likely due to  intracranial spread of infection. No intracranial abscess. 4. Findings most compatible with skull base infection which could be bacterial or fungal. 5. Nonenhancing fluid collection in the posterior nasopharynx likely due to abscess, unchanged from the recent MRI earlier today. 6. These results were called by telephone at the time of interpretation on 06/19/2020 at 2:29 pm to provider [place], who verbally acknowledged these results. Electronically Signed   By: Marlan Palauharles  Clark M.D.   On: 06/19/2020 14:29   CT VENOGRAM HEAD  Result Date: 06/20/2020 CLINICAL DATA:  Dural venous sinus thrombosis suspected.  Infection. EXAM: CT VENOGRAM HEAD TECHNIQUE: Venographic phase images of the head were obtained following the administration of intravenous contrast agent. A noncontrast head CT was performed prior to the administration of IV contrast. CONTRAST:  50mL OMNIPAQUE IOHEXOL 350  MG/ML SOLN COMPARISON:  None. FINDINGS: CT HEAD FINDINGS Brain: There is no mass, hemorrhage or extra-axial collection. There is generalized atrophy without lobar predilection. Hypodensity of the white matter is most commonly associated with chronic microvascular disease. Vascular: Atherosclerotic calcification of the vertebral and internal carotid arteries at the skull base. No abnormal hyperdensity of the major intracranial arteries or dural venous sinuses. Skull: Limited visualization of extracranial soft tissues. No skull fracture. Sinuses/Orbits: Opacification of the sphenoid sinuses and mastoid air cells. Bilateral middle ear opacification. The orbits are normal. CT VENOGRAM FINDINGS There is relatively poor enhancement of the dural venous sinuses on this study. Superior sagittal sinus, straight sinus, transverse sinuses, internal cerebral veins and vein of [place] are patent. The right sigmoid sinus is expanded with some central hypoattenuation which likely indicates thrombus. Assessment of the left sigmoid sinus is more limited. IMPRESSION: 1.  Poor enhancement of the dural venous sinuses on this study, with suspected thrombus within the right sigmoid sinus. Assessment of the left sigmoid sinus is more limited. 2. Opacification of the sphenoid sinuses and mastoid air cells and bilateral middle ear cavities. 3. Extensive soft tissue infection at the skull base is incompletely visualized and better characterized on earlier MRI. Electronically Signed   By: Deatra Robinson M.D.   On: 06/20/2020 03:55     Scheduled Meds:   . brimonidine  1 drop Right Eye BID  . insulin aspart  0-5 Units Subcutaneous QHS  . insulin aspart  0-9 Units Subcutaneous TID WC  . insulin detemir  7 Units Subcutaneous QHS  . levothyroxine  88 mcg Oral Q0600  . lisinopril  2.5 mg Oral QHS  . simvastatin  40 mg Oral QHS  . timolol  1 drop Right Eye BID  . tobramycin-dexamethasone  4 drop Both EARS BID    Continuous Infusions:   . ceFEPime (MAXIPIME) IV 2 g (06/20/20 1421)  . heparin 1,150 Units/hr (06/20/20 1117)     LOS: 2 days     Marcellus Scott, MD, [place], [facility]. [facility]    To contact the attending provider between 7A-7P or the covering provider during after hours 7P-7A, please log into the web site [email or web address] and access using universal [place] password for that web site. If you do not have the password, please call the hospital operator.  06/20/2020, 5:27 PM

## 2020-06-20 NOTE — Progress Notes (Addendum)
NEUROLOGY PROGRESS NOTE  Subjective: No acute events overnight. Per nursing and patients daughter at bedside he has had off and on double vision since December that has returned today. He did not sleep well overnight due to staff entering his room. Discussion held at length with the patients daughter regarding CVST and treatment with heparin.   Exam: Vitals:   06/19/20 2004 06/20/20 0347  BP: (!) 160/79 (!) 154/70  Pulse: (!) 53 62  Resp: 18 18  Temp: (!) 97.4 F (36.3 C) 97.7 F (36.5 C)  SpO2: 98% 93%   Physical Exam  Constitutional: Appears well-developed and well-nourished.  Psych: Affect appropriate to situation   Neuro:  Mental Status: AAOx4 ( used white board to ask questions due to patient being deaf and he answered verbally), follows commands to show two fingers, give thumbs up  Cranial Nerves: EOMI, VFF, Right eyelid weakness( incomplete right eyelid closure), right facial droop, deaf bilaterally  Motor: Normal bulk and tone, strength 5/5 throughout  Sensory: Intact to light touch throughout  Deep Tendon Reflexes: Deferred Cerebellar: FNF intact  Gait: Deferred     Pertinent Labs and Imaging:   06/19/20 MRI Brain W WO Contrast 1. Findings consistent with acute bilateral otomastoiditis with otitis externa with extensive infiltrative involvement of the skull base and upper neck, with associated skull base osteomyelitis. 2. Evidence for mild and/or early intracranial spread of infection with diffuse dural thickening and enhancement about the adjacent skull base. No MRI evidence for frank encephalitis or intracranial abscess. 3. 2.4 cm pre clival collection at the central nasopharynx, likely abscess. Additional smaller 6 mm abscess adjacent to the right sigmoid sinus as above. 4. Encasement of the distal cervical ICAs by the extensive infiltrative infection throughout the skull base without evidence for vascular occlusion or stroke at this time. No definite evidence for dural  venous sinus thrombosis on this exam as previously questioned, although follow-up examination with dedicated MRV would be more sensitive for evaluation of this potential complication.  06/19/20 MRV Head W WO Contrast 1. There is thrombus in the sigmoid sinus and jugular vein bilaterally compatible with venous sinus thrombosis. 2. Extensive skull base infection. There is extensive soft tissue swelling and enhancement involving the posterior nasopharynx and soft tissues below the posterior skull base bilaterally. This appears centered around the mastoid sinus bilaterally which is diffusely fluid-filled and has extensive surrounding soft tissue enhancement. There is enhancement and thickening of the external auditory canal bilaterally. There is mucosal edema in the paranasal sinuses primarily in the sphenoid sinus. 3. There is dural thickening and enhancement in the posterior fossa and to lesser extent around both cerebral hemispheres likely due to intracranial spread of infection. No intracranial abscess. 4. Findings most compatible with skull base infection which could be bacterial or fungal. 5. Nonenhancing fluid collection in the posterior nasopharynx likely due to abscess, unchanged from the recent MRI earlier today.  06/20/20 CT Venogram Head  1. Poor enhancement of the dural venous sinuses on this study, with suspected thrombus within the right sigmoid sinus. Assessment of the left sigmoid sinus is more limited. 2. Opacification of the sphenoid sinuses and mastoid air cells and bilateral middle ear cavities. 3. Extensive soft tissue infection at the skull base is incompletely visualized and better characterized on earlier MRI.  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   Neurology was consulted for assistance with management and treatment of CVST. Further CVST work up was done including the following. MRI Brain did not show definite evidence of dural venous sinus thrombosis thus MRV Head and CT Venogram Head were ordered for further evaluation. MRV Head showed thrombus in the sigmoid sinus and jugular vein bilaterally. CT Venogram Head revealed poor enhancement of the dural venous sinuses with suspected thrombus within the right sigmoid sinus. Evaluation of the left sigmoid sinus was limited. It is believed that patients CVST is due to underlying otomastoiditis and skull base osteomyelitis.   Recommendations:  1. ) Following three days of being stable on heparin recommend transitioning to oral anticoagulation with Eliquis for treatment of CVST. The duration of anticoagulation is undetermined at this time and will be determined based on future imaging in the outpatient setting.  2. ) Follow up imaging with MRV Head W WO Contrast in 2-4 months with outpatient neurology 3. ) Close to discharge please reach out to neurology and we will place an ambulatory referral for neurology follow up   Neurology will sign off at this time. Please access Amion to contact on call Neurologist with any questions.   Stark Jock, NP  [facility] 06/20/2020, 8:30 AM

## 2020-06-20 NOTE — Progress Notes (Signed)
ANTICOAGULATION CONSULT NOTE  Pharmacy Consult:  Heparin Indication: bilateral intracranial venous sinus thromboses  No Known Allergies  Patient Measurements: Height: 5\' 11"  (180.3 cm) Weight: 73.9 kg (162 lb 14.7 oz) IBW/kg (Calculated) : 75.3   Vital Signs: Temp: 97.7 F (36.5 C) (06/01 0347) Temp Source: Oral (06/01 0347) BP: 154/70 (06/01 0347) Pulse Rate: 62 (06/01 0347)  Labs: Recent Labs    06/18/20 2047 06/19/20 0224 06/20/20 0923  HGB 10.4* 11.2* 10.1*  HCT 31.4* 35.2* 30.3*  PLT 484* 489* 456*  HEPARINUNFRC  --   --  <0.10*  CREATININE 0.79 0.70 0.73    Estimated Creatinine Clearance: 71.8 mL/min (by C-G formula based on SCr of 0.73 mg/dL).  Assessment: 87yo male admitted with skull osteomyelitis, also found with bilateral intracranial venous sinus thromboses.  Pharmacy consulted for heparin dosing.  Heparin level is undetectable.  No issue with heparin infusion nor bleeding per RN.  Being conservative with heparin dosing due to bleeding risk.  Goal of Therapy:  Heparin level 0.3-0.5 units/mL Monitor platelets by anticoagulation protocol: Yes   Plan:  Increase heparin gtt at 1150 units/hr - no bolus Check 8 hr heparin level Daily heparin level and CBC  Bennie Scaff D. 87yo, PharmD, BCPS, BCCCP 06/20/2020, 11:13 AM

## 2020-06-20 NOTE — Progress Notes (Signed)
ANTICOAGULATION CONSULT NOTE - Initial Consult  Pharmacy Consult for heparin Indication: bilateral intracranial venous sinus thromboses  No Known Allergies  Patient Measurements: Height: 5\' 11"  (180.3 cm) Weight: 73.9 kg (162 lb 14.7 oz) IBW/kg (Calculated) : 75.3  Vital Signs: Temp: 97.4 F (36.3 C) (05/31 2004) Temp Source: Oral (05/31 2004) BP: 160/79 (05/31 2004) Pulse Rate: 53 (05/31 2004)  Labs: Recent Labs    06/18/20 2047 06/19/20 0224  HGB 10.4* 11.2*  HCT 31.4* 35.2*  PLT 484* 489*  CREATININE 0.79 0.70    Estimated Creatinine Clearance: 71.8 mL/min (by C-G formula based on SCr of 0.7 mg/dL).   Medical History: Past Medical History:  Diagnosis Date  . Diabetes mellitus without complication (HCC)   . Hypertension   . Thyroid disease     Medications:  Medications Prior to Admission  Medication Sig Dispense Refill Last Dose  . aspirin 81 MG EC tablet Take 81 mg by mouth daily.   06/18/2020 at Unknown time  . brimonidine (ALPHAGAN) 0.2 % ophthalmic solution Place 1 drop into the right eye in the morning and at bedtime.   06/18/2020 at Unknown time  . cholecalciferol (VITAMIN D) 25 MCG (1000 UNIT) tablet Take 1,000 Units by mouth daily.   06/18/2020 at Unknown time  . ciprofloxacin-dexamethasone (CIPRODEX) OTIC suspension Place 4 drops into both ears See admin instructions. Bid x 10 days   06/18/2020 at Unknown time  . ibuprofen (ADVIL) 200 MG tablet Take 400 mg by mouth every 6 (six) hours as needed for headache or moderate pain.   unk  . insulin detemir (LEVEMIR) 100 UNIT/ML injection Inject 7 Units into the skin at bedtime.   06/17/2020 at Unknown time  . insulin regular (NOVOLIN R) 100 units/mL injection Inject 1-10 Units into the skin See admin instructions. Per sliding scale 3 times daily with meals 70-150=0 units 151-200=1 units 201-250=2 units  251-300=3 units 301-350=4 units 351-400=6 units Greater than 400=10 units   06/18/2020 at Unknown time  .  levothyroxine (SYNTHROID) 88 MCG tablet Take 88 mcg by mouth daily.   06/18/2020 at Unknown time  . lisinopril (ZESTRIL) 2.5 MG tablet Take 2.5 mg by mouth at bedtime.   06/17/2020 at Unknown time  . metFORMIN (GLUCOPHAGE) 850 MG tablet Take 850 mg by mouth daily.   06/18/2020 at Unknown time  . simvastatin (ZOCOR) 40 MG tablet Take 40 mg by mouth at bedtime.   06/17/2020 at Unknown time  . timolol (BETIMOL) 0.5 % ophthalmic solution Place 1 drop into the right eye 2 (two) times daily.   06/18/2020 at Unknown time  . vitamin B-12 (CYANOCOBALAMIN) 1000 MCG tablet Take 1,000 mcg by mouth daily.   06/18/2020 at Unknown time   Scheduled:  . brimonidine  1 drop Right Eye BID  . insulin aspart  0-5 Units Subcutaneous QHS  . insulin aspart  0-9 Units Subcutaneous TID WC  . insulin detemir  7 Units Subcutaneous QHS  . levothyroxine  88 mcg Oral Q0600  . lisinopril  2.5 mg Oral QHS  . simvastatin  40 mg Oral QHS  . timolol  1 drop Right Eye BID  . tobramycin-dexamethasone  4 drop Both EARS BID   Infusions:  . ceFEPime (MAXIPIME) IV 2 g (06/19/20 2115)    Assessment: 87yo male admitted w/ skull osteomyelitis, also found with bilateral intracranial venous sinus thromboses, to begin heparin without bolus per neuro request.  Goal of Therapy:  Heparin level 0.3-0.7 units/ml Monitor platelets by anticoagulation protocol: Yes  Plan:  Heparin gtt at 1000 units/hr. Monitor heparin levels and CBC.  Vernard Gambles, PharmD, BCPS  06/20/2020,12:06 AM

## 2020-06-20 NOTE — Progress Notes (Signed)
Palliative:  Consult received and chart reviewed. Per ID note, Daughter requested holding off on meeting with palliative care until 6/2. Did attempt to call daughter to introduce role of palliative care and schedule meeting for tomorrow however did not get an answer - voice mail left with call back number. Will follow up 6/2.  Gerlean Ren, DNP, AGNP-C Palliative Medicine Team Team Phone # [phone]  Pager # [phone]  NO CHARGE

## 2020-06-20 NOTE — Progress Notes (Signed)
ENT Progress Note: HD #3   Subjective: No complaints, headache resolved.  Patient sitting up.  Objective: Vital signs in last 24 hours: Temp:  [97.4 F (36.3 C)-98.3 F (36.8 C)] 97.7 F (36.5 C) (06/01 0347) Pulse Rate:  [51-62] 62 (06/01 0347) Resp:  [14-18] 18 (06/01 0347) BP: (153-160)/(70-79) 154/70 (06/01 0347) SpO2:  [93 %-100 %] 93 % (06/01 0347) Weight change:  Last BM Date:  (PTA)  Intake/Output from previous day: 05/31 0701 - 06/01 0700 In: 100 [IV Piggyback:100] Out: -  Intake/Output this shift: No intake/output data recorded.  Labs: Recent Labs    06/19/20 0224 06/20/20 0923  WBC 8.9 6.2  HGB 11.2* 10.1*  HCT 35.2* 30.3*  PLT 489* 456*   Recent Labs    06/19/20 0224 06/20/20 0923  NA 128* 129*  K 3.9 3.5  CL 91* 90*  CO2 25 31  GLUCOSE 223* 94  BUN 17 14  CALCIUM 8.4* 8.1*    Studies/Results: MR BRAIN W WO CONTRAST  Result Date: 06/19/2020 CLINICAL DATA:  Initial evaluation for acute otitis media with skull base infection. EXAM: MRI HEAD WITHOUT AND WITH CONTRAST TECHNIQUE: Multiplanar, multiecho pulse sequences of the brain and surrounding structures were obtained without and with intravenous contrast. CONTRAST:  34mL GADAVIST GADOBUTROL 1 MMOL/ML IV SOLN COMPARISON:  Recent temporal bone CT from 06/15/2020. FINDINGS: Brain: Cerebral volume within normal limits for age. Minimal scattered T2/FLAIR hyperintensity seen involving the supratentorial cerebral white matter, likely chronic microvascular ischemic disease, minimal for age. 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 No evidence for acute or subacute ischemia. No acute or chronic intracranial hemorrhage. No mass lesion, mass effect, or midline shift. No hydrocephalus. Again seen are large bilateral mastoid and middle ear effusions. Extensive abnormal infiltrative soft tissue density and thickening seen extending medially to involve the skull base including the  petroclival regions, pre clival nasopharynx, with involvement of the jugular foramina bilaterally. Associated osseous erosion better appreciated on prior temporal bone CT. Area demonstrates diffuse heterogeneous signal abnormality with fairly avid postcontrast enhancement. Associated soft tissue swelling and thickening involves the visualized external auditory canals as well, with associated narrowing of both EACs. Constellation of findings consistent with extensive bilateral otitis media/otitis externa with associated infiltrative skull base infection. Heterogeneous marrow signal intensity within the clivus and petrous portions of both temporal bones likely reflect associated osteomyelitis. Pre clival nasopharyngeal collection again seen, measuring 2.4 x 0.9 cm (series 25, image 3). Additional probable small fluid collection seen dissecting medially from the right mastoid air cells just above the sigmoid sinus and towards the right cerebellar pontine angle cistern, measuring approximately 6 x 5 mm (series 25, image 4). There is associated enhancing phlegmonous change along the posterior aspect of the clivus (series 25, image 3). Associated smooth dural thickening and enhancement throughout the adjacent skull base and along the inferior temporal convexities bilaterally, likely reflecting early intracranial spread of infection (series 26, image 7). This is also seen on axial images as well (series 25, image 8). No parenchymal edema to suggest frank encephalitis within the brain itself. No visible evidence for spread of infection into either internal auditory canal. No other acute intracranial abnormality. Vascular: Encasement of the distal cervical ICAs and proximal internal jugular veins by the infiltrative phlegmonous change throughout the skull base again noted. Major arterial intracranial vascular flow voids are maintained without evidence for occlusion or other complication at this time. The major dural sinuses  appear grossly patent as  well, with no visible evidence for dural sinus thrombosis on this non MRV exam as previously questioned. Skull and upper cervical spine: Evidence for associated osteomyelitis involving the petrous temporal bones bilaterally and clivus. Additional involvement of the left greater than right occipital condyles as well as the lateral masses of C1 and dens. Additional diffuse edema seen throughout the soft tissues of the visualized upper neck (series 23, image 1). No other visible collections. Sinuses/Orbits: Patient status post bilateral ocular lens replacement. Globes and orbital soft tissues demonstrate no other acute finding. Evidence for concomitant/associated right sphenoid sinusitis. Additional mild mucosal thickening noted elsewhere within the paranasal sinuses. Other: None. IMPRESSION: 1. Findings consistent with acute bilateral otomastoiditis with otitis externa with extensive infiltrative involvement of the skull base and upper neck, with associated skull base osteomyelitis. 2. Evidence for mild and/or early intracranial spread of infection with diffuse dural thickening and enhancement about the adjacent skull base. No MRI evidence for frank encephalitis or intracranial abscess. 3. 2.4 cm pre clival collection at the central nasopharynx, likely abscess. Additional smaller 6 mm abscess adjacent to the right sigmoid sinus as above. 4. Encasement of the distal cervical ICAs by the extensive infiltrative infection throughout the skull base without evidence for vascular occlusion or stroke at this time. No definite evidence for dural venous sinus thrombosis on this exam as previously questioned, although follow-up examination with dedicated MRV would be more sensitive for evaluation of this potential complication. Electronically Signed   By: Rise MuBenjamin  McClintock M.D.   On: 06/19/2020 04:38   MR CERVICAL SPINE W WO CONTRAST  Result Date: 06/19/2020 CLINICAL DATA:  Neck pain.  Skull base  infection. EXAM: MRI CERVICAL SPINE WITHOUT AND WITH CONTRAST TECHNIQUE: Multiplanar and multiecho pulse sequences of the cervical spine, to include the craniocervical junction and cervicothoracic junction, were obtained without and with intravenous contrast. CONTRAST:  7mL GADAVIST GADOBUTROL 1 MMOL/ML IV SOLN COMPARISON:  MRI head 06/19/2020 FINDINGS: Alignment: Normal Vertebrae: Negative for fracture. There is C1-2 arthropathy with joint effusion and synovial thickening and enhancement around the dens and anterior arch of C1 as well as posterior to the dens. There is extensive surrounding soft tissue enhancement below the skull base and anterior to C1 compatible with infection. Bony erosion of the skull base is best seen on recent CT. Cord: Cord evaluation is limited by motion. No cord signal abnormality identified. No epidural abscess identified. Posterior Fossa, vertebral arteries, paraspinal tissues: Refer to MRI and MR venogram report from today. Briefly, there is extensive soft tissue swelling below the posterior skull base and clivus. There is dural thickening and enhancement posterior to the clivus. Bilateral mastoid effusion. Disc levels: Mild degenerative change in the cervical spine. Bilateral foraminal stenosis at C3-4, C4-5, and C5-6 due to disc and facet degeneration. IMPRESSION: Extensive soft tissue enhancement below the skull base. This extends into the C1-2 joint which is likely involved with infection and septic arthritis. Bony erosion of the skull base best seen on CT compatible with osteomyelitis. No epidural abscess in the cervical spine. Dural thickening enhancement in the posterior fossa compatible with intracranial extension of infection. Electronically Signed   By: Marlan Palauharles  Clark M.D.   On: 06/19/2020 14:42   DG CHEST PORT 1 VIEW  Result Date: 06/19/2020 CLINICAL DATA:  Hypoxia. EXAM: PORTABLE CHEST 1 VIEW COMPARISON:  No prior. FINDINGS: Mediastinum hilar structures normal. Heart  size normal. Low lung volumes. No focal infiltrate. No pleural effusion or pneumothorax. Degenerative change thoracic spine. IMPRESSION: Low lung volumes.  No acute abnormality identified. Electronically Signed   By: Maisie Fus  Register   On: 06/19/2020 05:28   MR MRV HEAD W WO CONTRAST  Result Date: 06/19/2020 CLINICAL DATA:  Acute neck pain. Infection. Skull base infection on CT. Rule out venous thrombosis. EXAM: MR VENOGRAM HEAD WITHOUT AND WITH CONTRAST TECHNIQUE: Angiographic images of the intracranial venous structures were acquired using MRV technique without and with intravenous contrast. CONTRAST:  70mL GADAVIST GADOBUTROL 1 MMOL/ML IV SOLN COMPARISON:  CT temporal bone 06/15/2020. MRI head with contrast 06/19/2020 FINDINGS: Superior sagittal sinus is widely patent. Straight sinus is widely patent. The transverse sinus is patent bilaterally. There is filling defect on postcontrast images in the sigmoid sinus and jugular vein bilaterally compatible with venous thrombosis bilaterally. Postcontrast imaging reveals extensive enhancement and soft tissue thickening inferior to the posterior skull base and mastoid sinus bilaterally. There are bilateral mastoid effusion. There is extensive soft tissue swelling and enhancement of the external auditory canal and pinna bilaterally without abscess. There is extensive soft tissue thickening and enhancement of the posterior nasopharynx with central nonenhancing fluid collection measuring approximately 10 x 20 mm, similar to the prior study. Probable abscess. There is dural thickening and enhancement in the posterior fossa along the clivus and around the cerebellum anteriorly compatible with intracranial spread of infection. No intracranial fluid collection or abscess is identified. There is mild hydrocephalus, unchanged. Mild dural thickening and enhancement around both cerebral hemispheres also likely related to intracranial infection. There is mucosal edema and mucosal  enhancement in the sphenoid sinus bilaterally. Remaining paranasal sinuses show mild mucosal edema. IMPRESSION: 1. There is thrombus in the sigmoid sinus and jugular vein bilaterally compatible with venous sinus thrombosis. 2. Extensive skull base infection. There is extensive soft tissue swelling and enhancement involving the posterior nasopharynx and soft tissues below the posterior skull base bilaterally. This appears centered around the mastoid sinus bilaterally which is diffusely fluid-filled and has extensive surrounding soft tissue enhancement. There is enhancement and thickening of the external auditory canal bilaterally. There is mucosal edema in the paranasal sinuses primarily in the sphenoid sinus. 3. There is dural thickening and enhancement in the posterior fossa and to lesser extent around both cerebral hemispheres likely due to intracranial spread of infection. No intracranial abscess. 4. Findings most compatible with skull base infection which could be bacterial or fungal. 5. Nonenhancing fluid collection in the posterior nasopharynx likely due to abscess, unchanged from the recent MRI earlier today. 6. These results were called by telephone at the time of interpretation on 06/19/2020 at 2:29 pm to provider [place], who verbally acknowledged these results. Electronically Signed   By: Marlan Palau M.D.   On: 06/19/2020 14:29   CT VENOGRAM HEAD  Result Date: 06/20/2020 CLINICAL DATA:  Dural venous sinus thrombosis suspected.  Infection. EXAM: CT VENOGRAM HEAD TECHNIQUE: Venographic phase images of the head were obtained following the administration of intravenous contrast agent. A noncontrast head CT was performed prior to the administration of IV contrast. CONTRAST:  37mL OMNIPAQUE IOHEXOL 350 MG/ML SOLN COMPARISON:  None. FINDINGS: CT HEAD FINDINGS Brain: There is no mass, hemorrhage or extra-axial collection. There is generalized atrophy without lobar predilection. Hypodensity of the white matter  is most commonly associated with chronic microvascular disease. Vascular: Atherosclerotic calcification of the vertebral and internal carotid arteries at the skull base. No abnormal hyperdensity of the major intracranial arteries or dural venous sinuses. Skull: Limited visualization of extracranial soft tissues. No skull fracture. Sinuses/Orbits: Opacification of the sphenoid sinuses  and mastoid air cells. Bilateral middle ear opacification. The orbits are normal. CT VENOGRAM FINDINGS There is relatively poor enhancement of the dural venous sinuses on this study. Superior sagittal sinus, straight sinus, transverse sinuses, internal cerebral veins and vein of [place] are patent. The right sigmoid sinus is expanded with some central hypoattenuation which likely indicates thrombus. Assessment of the left sigmoid sinus is more limited. IMPRESSION: 1. Poor enhancement of the dural venous sinuses on this study, with suspected thrombus within the right sigmoid sinus. Assessment of the left sigmoid sinus is more limited. 2. Opacification of the sphenoid sinuses and mastoid air cells and bilateral middle ear cavities. 3. Extensive soft tissue infection at the skull base is incompletely visualized and better characterized on earlier MRI. Electronically Signed   By: Deatra Robinson M.D.   On: 06/20/2020 03:55     PHYSICAL EXAM: No change in exam, continue right facial weakness and decreasing otorrhea.   Assessment/Plan: Patient stable, more alert and conversant.  Sitting up and comfortable.  Discussed clinical findings and recent testing with patient, daughter and family by video chat.  Expressed long-term treatment goals including antibiotic therapy improved otorrhea and ability to wear his hearing aids and communicate better.  Appreciate input from infectious disease, medical service and neurosurgery.  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  Anticipate long-term IV access for antibiotic therapy and potential discharge to his family's home if stable and continued improvement.  We will follow with treatment team.    Osborn Coho 06/20/2020, 12:36 PM

## 2020-06-20 NOTE — Progress Notes (Addendum)
I have seen and examined the patient. I have personally reviewed the clinical findings, laboratory findings, microbiological data and imaging studies. The assessment and treatment plan was discussed with the  Advance Practice Provider, Jeanine Luz  I agree with her/his recommendations except following additions/corrections.  Afebrile, no leukocytosis, hemodynamically stable Discussed with daughter at length regarding medical management. She is overwhelmed. She says she has to talk to her siblings and relay  Information to her father.  MRV/MR cervical spine and CT venogram findings noted   Seen by NeuroSX - no plans for intervention  Started on 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 ENT and Neurosurgery recommend continuing with medical care. Given extent of infection the prognosis is guarded at best and Palliative Care has been consulted for goals of care discussion. Daughter requesting to wait until tomorrow for Palliative Care if possible as they are working on processing the information. I discussed that this is where Palliative Care would be an asset. Expressed that he would like to go back to [place]. Neurology managing thrombosis with heparin. May consider PICC line placement pending goals of care as blood cultures remain without growth. Will  continue with current dose of cefepime.   PLAN:  1. Continue current dose of cefepime. 2. Sinus thrombus management with heparin per Neurology. 3. Palliative care discussion for goals of care. \ 4. Consider PICC line pending goals of care.   Principal Problem:   Mastoiditis Active Problems:   Osteomyelitis of skull (HCC)   Pseudomonas aeruginosa infection   Hypertensive urgency   Anemia   Insulin dependent type 2 diabetes mellitus (HCC)   . brimonidine  1 drop Right Eye BID  . insulin aspart  0-5 Units Subcutaneous QHS  . insulin aspart  0-9 Units Subcutaneous TID WC  . insulin detemir  7 Units Subcutaneous QHS  . levothyroxine  88 mcg Oral Q0600  . lisinopril  2.5 mg Oral QHS  . simvastatin  40 mg Oral QHS  . timolol  1 drop Right Eye BID  . tobramycin-dexamethasone  4 drop Both EARS BID    SUBJECTIVE:  Afebrile overnight with no acute events. Seen by ENT and Neurosurgery with recommendations for continued medical care. MRV with sigmoid sinus and jugular veins. Started on heparin per Neurology. Spoke with daughter today after discussion with Dr. Elinor James and currently feeling overwhelmed with all the information.   No Known Allergies   Review of Systems: Review of Systems  Constitutional: Negative for chills, fever and weight loss.  Respiratory: Negative for cough, shortness of breath and wheezing.   Cardiovascular: Negative for chest pain and leg swelling.  Gastrointestinal: Positive for nausea. Negative for abdominal pain, constipation, diarrhea and vomiting.  Skin: Negative for rash.  Neurological: Positive for dizziness and headaches.      OBJECTIVE: Vitals:  06/19/20 0900 06/19/20 1500 06/19/20 2004 06/20/20 0347  BP:  (!) 153/70 (!) 160/79 (!) 154/70  Pulse:  (!) 51 (!) 53 62  Resp:  14 18 18   Temp:  98.3 F (36.8 C) (!) 97.4 F (36.3 C) 97.7 F (36.5 C)  TempSrc:  Oral Oral Oral  SpO2:  100% 98% 93%  Weight: 73.9 kg     Height: 5\' 11"  (1.803 m)       Body mass index is 22.72 kg/m.  Physical Exam Constitutional:      General: He is not in acute distress.    Appearance: He is well-developed.     Comments: Lying in bed with head of bed elevated; pleasant.   Cardiovascular:     Rate and Rhythm: Normal rate and regular rhythm.     Heart sounds: Normal heart sounds.  Pulmonary:     Effort: Pulmonary effort is normal.     Breath sounds: Normal breath sounds.  Skin:    General: Skin is warm and dry.  Neurological:     Mental Status: He is alert and oriented to person, place, and time.  Psychiatric:        Behavior: Behavior normal.        Thought Content: Thought content normal.        Judgment: Judgment normal.     Lab Results Lab Results  Component Value Date   WBC 8.9 06/19/2020   HGB 11.2 (L) 06/19/2020   HCT 35.2 (L) 06/19/2020   MCV 92.1 06/19/2020   PLT 489 (H) 06/19/2020    Lab Results  Component Value Date   CREATININE 0.70 06/19/2020   BUN 17 06/19/2020   NA 128 (L) 06/19/2020   K 3.9 06/19/2020   CL 91 (L) 06/19/2020   CO2 25 06/19/2020    Lab Results  Component Value Date   ALT 14 06/18/2020   AST 16 06/18/2020   ALKPHOS 75 06/18/2020   BILITOT 0.6 06/18/2020     Microbiology: Recent Results (from the past 240 hour(s))  Blood culture (routine x 2)     Status: None (Preliminary result)   Collection Time: 06/18/20  8:45 PM   Specimen: BLOOD RIGHT ARM  Result Value Ref Range Status   Specimen Description BLOOD RIGHT ARM  Final   Special Requests   Final    BOTTLES DRAWN AEROBIC AND ANAEROBIC Blood Culture results may not be optimal due to an excessive volume of blood received in culture bottles   Culture   Final    NO GROWTH 2 DAYS Performed at [facility], [address] [place], [place] [address]    Report Status PENDING  Incomplete  Blood culture (routine x 2)     Status: None (Preliminary result)   Collection Time: 06/18/20  8:53 PM   Specimen: BLOOD LEFT ARM  Result Value Ref  Range Status   Specimen Description BLOOD LEFT ARM  Final   Special Requests   Final    BOTTLES DRAWN AEROBIC ONLY Blood Culture results may not be optimal due to an excessive volume of blood received in culture bottles   Culture   Final    NO GROWTH 2 DAYS Performed at [facility], [address] [place], [place] [address]    Report Status PENDING  Incomplete  Resp Panel by RT-PCR (Flu A&B, Covid) Nasopharyngeal Swab     Status: None   Collection Time: 06/18/20 10:49 PM   Specimen: Nasopharyngeal Swab; Nasopharyngeal(NP) swabs  in vial transport medium  Result Value Ref Range Status   SARS Coronavirus 2 by RT PCR NEGATIVE NEGATIVE Final    Comment: (NOTE) SARS-CoV-2 target nucleic acids are NOT DETECTED.  The SARS-CoV-2 RNA is generally detectable in upper respiratory specimens during the acute phase of infection. The lowest concentration of SARS-CoV-2 viral copies this assay can detect is 138 copies/mL. A negative result does not preclude SARS-Cov-2 infection and should not be used as the sole basis for treatment or other patient management decisions. A negative result may occur with  improper specimen collection/handling, submission of specimen other than nasopharyngeal swab, presence of viral mutation(s) within the areas targeted by this assay, and inadequate number of viral copies(<138 copies/mL). A negative result must be combined with clinical observations, patient history, and epidemiological information. The expected result is Negative.  Fact Sheet for Patients:  BloggerCourse.com  Fact Sheet for Healthcare Providers:  SeriousBroker.it  This test is no t yet approved or cleared by the [place] FDA and  has been authorized for detection and/or diagnosis of SARS-CoV-2 by FDA under an Emergency Use Authorization (EUA). This EUA will remain  in effect (meaning this test can be used) for the duration of  the COVID-19 declaration under Section 564(b)(1) of the Act, 21 U.S.C.section 360bbb-3(b)(1), unless the authorization is terminated  or revoked sooner.       Influenza A by PCR NEGATIVE NEGATIVE Final   Influenza B by PCR NEGATIVE NEGATIVE Final    Comment: (NOTE) The Xpert Xpress SARS-CoV-2/FLU/RSV plus assay is intended as an aid in the diagnosis of influenza from Nasopharyngeal swab specimens and should not be used as a sole basis for treatment. Nasal washings and aspirates are unacceptable for Xpert Xpress SARS-CoV-2/FLU/RSV testing.  Fact Sheet for Patients: BloggerCourse.com  Fact Sheet for Healthcare Providers: SeriousBroker.it  This test is not yet approved or cleared by the [place] FDA and has been authorized for detection and/or diagnosis of SARS-CoV-2 by FDA under an Emergency Use Authorization (EUA). This EUA will remain in effect (meaning this test can be used) for the duration of the COVID-19 declaration under Section 564(b)(1) of the Act, 21 U.S.C. section 360bbb-3(b)(1), unless the authorization is terminated or revoked.  Performed at [facility], [address], [place], [place] [address]    Pertinent Imagings   CT Venogram  06/20/20 IMPRESSION: 1. Poor enhancement of the dural venous sinuses on this study, with suspected thrombus within the right sigmoid sinus. Assessment of the left sigmoid sinus is more limited. 2. Opacification of the sphenoid sinuses and mastoid air cells and bilateral middle ear cavities. 3. Extensive soft tissue infection at the skull base is incompletely visualized and better characterized on earlier MRI.  MRV head 06/19/20 IMPRESSION: 1. There is thrombus in the sigmoid sinus and jugular vein bilaterally compatible with venous sinus thrombosis. 2. Extensive skull base infection. There is extensive soft tissue swelling and enhancement involving the posterior  nasopharynx and soft tissues below the posterior skull base bilaterally. This appears centered around the mastoid sinus bilaterally which is diffusely fluid-filled and has extensive surrounding soft tissue enhancement. There is enhancement and thickening of the external auditory canal bilaterally. There is mucosal edema in the paranasal sinuses primarily in the sphenoid sinus. 3. There is dural thickening and enhancement in the posterior fossa and to lesser extent around both cerebral hemispheres likely due to intracranial spread of infection. No intracranial abscess. 4. Findings most compatible with skull base infection which could be bacterial  or fungal. 5. Nonenhancing fluid collection in the posterior nasopharynx likely due to abscess, unchanged from the recent MRI earlier today. 6. These results were called by telephone at the time of interpretation on 06/19/2020 at 2:29 pm to provider [place], who verbally acknowledged these results.  MRI cervical spine 06/19/20 IMPRESSION: Extensive soft tissue enhancement below the skull base. This extends into the C1-2 joint which is likely involved with infection and septic arthritis. Bony erosion of the skull base best seen on CT compatible with osteomyelitis. No epidural abscess in the cervical spine.  Dural thickening enhancement in the posterior fossa compatible with intracranial extension of infection.     Nicolas Eke, NP [facility]  06/20/2020  10:09 AM

## 2020-06-20 NOTE — Progress Notes (Signed)
Notified MD that pt was not drinking/eating much on clear diet and pt was not on IV fluids.

## 2020-06-20 NOTE — Progress Notes (Addendum)
ANTICOAGULATION CONSULT NOTE - Follow Up Consult  Pharmacy Consult for IV Heparin Indication: bilateral intracranial venous sinus thromboses  No Known Allergies  Patient Measurements: Height: 5\' 11"  (180.3 cm) Weight: 73.9 kg (162 lb 14.7 oz) IBW/kg (Calculated) : 75.3  Heparin Dosing Weight: 73.9 kg  Vital Signs: Temp: 98 F (36.7 C) (06/01 2117) Temp Source: Oral (06/01 2117) BP: 158/83 (06/01 2117) Pulse Rate: 51 (06/01 2117)  Labs: Recent Labs    06/18/20 2047 06/19/20 0224 06/20/20 0923 06/20/20 1950  HGB 10.4* 11.2* 10.1*  --   HCT 31.4* 35.2* 30.3*  --   PLT 484* 489* 456*  --   HEPARINUNFRC  --   --  <0.10* 0.23*  CREATININE 0.79 0.70 0.73  --     Estimated Creatinine Clearance: 71.8 mL/min (by C-G formula based on SCr of 0.73 mg/dL).   Medical History: Past Medical History:  Diagnosis Date  . Diabetes mellitus without complication (HCC)   . Hypertension   . Thyroid disease     Assessment: 87 yr old male admitted with skull osteomyelitis/mastoiditis, also found with bilateral intracranial venous sinus thromboses, to begin heparin without bolus per neurology request.  Heparin level ~9 hrs after heparin infusion was increased to 1100 units/hr was 0.23 units/ml, which is below the goal range for this pt. H/H 10.1/30.3, plt 456. Per RN, no issues with IV or bleeding observed.  Per neurology note, plan is to transition pt to apixaban after 3 days of being stable of heparin infusion.   Goal of Therapy:  Heparin level: 0.3-0.5 units/ml Monitor platelets by anticoagulation protocol: Yes   Plan:  Increase heparin infusion to 1250 units/hr Check heparin level in 8 hrs Monitor daily heparin level, CBC Monitor for bleeeding F/U transition to apixaban  03-22-1985, PharmD, BCPS, [facility] Clinical Pharmacist 06/20/2020,9:27 PM

## 2020-06-20 NOTE — Progress Notes (Signed)
New orders for fluids--NS at 75 mL/hr

## 2020-06-21 ENCOUNTER — Inpatient Hospital Stay: Payer: Self-pay

## 2020-06-21 DIAGNOSIS — Z7189 Other specified counseling: Secondary | ICD-10-CM

## 2020-06-21 DIAGNOSIS — M542 Cervicalgia: Secondary | ICD-10-CM

## 2020-06-21 DIAGNOSIS — Z515 Encounter for palliative care: Secondary | ICD-10-CM

## 2020-06-21 LAB — BASIC METABOLIC PANEL
Anion gap: 7 (ref 5–15)
BUN: 11 mg/dL (ref 8–23)
CO2: 28 mmol/L (ref 22–32)
Calcium: 8.3 mg/dL — ABNORMAL LOW (ref 8.9–10.3)
Chloride: 97 mmol/L — ABNORMAL LOW (ref 98–111)
Creatinine, Ser: 0.74 mg/dL (ref 0.61–1.24)
GFR, Estimated: >60 mL/min (ref >60)
Glucose, Bld: 123 mg/dL — ABNORMAL HIGH (ref 70–99)
Potassium: 3.8 mmol/L (ref 3.5–5.1)
Sodium: 132 mmol/L — ABNORMAL LOW (ref 135–145)

## 2020-06-21 LAB — CBC
HCT: 31 % — ABNORMAL LOW (ref 39.0–52.0)
Hemoglobin: 10.3 g/dL — ABNORMAL LOW (ref 13.0–17.0)
MCH: 29.4 pg (ref 26.0–34.0)
MCHC: 33.2 g/dL (ref 30.0–36.0)
MCV: 88.6 fL (ref 80.0–100.0)
Platelets: 428 10*3/uL — ABNORMAL HIGH (ref 150–400)
RBC: 3.5 MIL/uL — ABNORMAL LOW (ref 4.22–5.81)
RDW: 13.5 % (ref 11.5–15.5)
WBC: 6.5 10*3/uL (ref 4.0–10.5)
nRBC: 0 % (ref 0.0–0.2)

## 2020-06-21 LAB — GLUCOSE, CAPILLARY
Glucose-Capillary: 117 mg/dL — ABNORMAL HIGH (ref 70–99)
Glucose-Capillary: 181 mg/dL — ABNORMAL HIGH (ref 70–99)
Glucose-Capillary: 299 mg/dL — ABNORMAL HIGH (ref 70–99)

## 2020-06-21 LAB — HEMOGLOBIN A1C
Hgb A1c MFr Bld: 8.5 % — ABNORMAL HIGH (ref 4.8–5.6)
Mean Plasma Glucose: 197 mg/dL

## 2020-06-21 LAB — HEPARIN LEVEL (UNFRACTIONATED)
Heparin Unfractionated: 0.4 IU/mL (ref 0.30–0.70)
Heparin Unfractionated: 0.51 IU/mL (ref 0.30–0.70)

## 2020-06-21 MED ORDER — INSULIN DETEMIR 100 UNIT/ML ~~LOC~~ SOLN
5.0000 [IU] | Freq: Once | SUBCUTANEOUS | Status: AC
  Administered 2020-06-21: 5 [IU] via SUBCUTANEOUS
  Filled 2020-06-21: qty 0.05

## 2020-06-21 MED ORDER — ACETAMINOPHEN 500 MG PO TABS
1000.0000 mg | ORAL_TABLET | Freq: Three times a day (TID) | ORAL | Status: DC
  Administered 2020-06-21: 1000 mg via ORAL
  Filled 2020-06-21 (×2): qty 2

## 2020-06-21 NOTE — Evaluation (Signed)
Clinical/Bedside Swallow Evaluation Patient Details  Name: Nicolas James MRN: [id number] Date of Birth: 10/22/1933  Today's Date: 06/21/2020 Time: SLP Start Time (ACUTE ONLY): 0930 SLP Stop Time (ACUTE ONLY): 0956 SLP Time Calculation (min) (ACUTE ONLY): 26 min  Past Medical History:  Past Medical History:  Diagnosis Date  . Diabetes mellitus without complication (HCC)   . Hypertension   . Thyroid disease    Past Surgical History:  Past Surgical History:  Procedure Laterality Date  . JOINT REPLACEMENT Bilateral    Hips and Knees   HPI:  87 year old male with past medical history significant for but not limited to DM, HTN, arthritis, hypothyroidism, bilateral hip and knee replacements, having issues with bilateral ear since August 2021 (ear pain/pressure like symptoms, chronic drainage) admitted on 5/30 due to symptoms of disequilibrium/headache and vomiting.  Pt underwent bialtertubes, had Bell's Palsy and eventually found to have infections resistant to fluoroquinolones.  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 As per RN report, patient not taking much orally.  Currently on clear liquids only.  Swallow eval to ensure safe advancement of diet   Assessment / Plan / Recommendation Clinical Impression  Pt demonstrates mild CNI VII impairment that per his daughter has improved since April. Pt initially was struggling to tolerate foods and liquids at that time, but has devleoped compensatory strategies and has progressed to regular solids and thin liquids at home if carefully prepared. Today pt observed to drink well through a straw with adequate labial closure, no signs of aspiration. He self feeds cereal, places bolus on left and completes a lingual sweep independently to  clear buccal residue. He also uses a liquid wash. Pt is able to resume a regular diet and thin liquids with assist from family to order foods of choice. We discussed the importance of oral hygiene in the setting of weakness. No further SLP needs at this time as pt and family are independent. SLP Visit Diagnosis: Dysphagia, oral phase (R13.11)    Aspiration Risk  Mild aspiration risk    Diet Recommendation Regular;Thin liquid   Liquid Administration via: Cup;Straw Medication Administration: Whole meds with liquid Supervision: Patient able to self feed Compensations: Lingual sweep for clearance of pocketing Postural Changes: Seated upright at 90 degrees    Other  Recommendations Oral Care Recommendations: Patient independent with oral care   Follow up Recommendations None      Frequency and Duration            Prognosis        Swallow Study   General HPI: 87 year old male with past medical history significant for but not limited to DM, HTN, arthritis, hypothyroidism, bilateral hip and knee replacements, having issues with bilateral ear since August 2021 (ear pain/pressure like symptoms, chronic drainage) admitted on 5/30 due to symptoms of disequilibrium/headache and vomiting.  Pt underwent bialtertubes, had Bell's Palsy and eventually found to have infections resistant to fluoroquinolones.  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 As per RN report, patient not taking much orally.  Currently on clear liquids only.  Swallow eval to ensure safe advancement of diet Type of Study: Bedside Swallow Evaluation Diet Prior to this  Study: Thin liquids Temperature Spikes Noted: No Respiratory Status: Room air History of Recent Intubation: No Behavior/Cognition:  Alert;Cooperative;Pleasant mood Oral Cavity Assessment: Within Functional Limits Oral Care Completed by SLP: No Oral Cavity - Dentition: Adequate natural dentition Vision: Functional for self-feeding Self-Feeding Abilities: Able to feed self Patient Positioning: Upright in chair Baseline Vocal Quality: Normal Volitional Cough: Strong Volitional Swallow: Able to elicit    Oral/Motor/Sensory Function Overall Oral Motor/Sensory Function: Mild impairment Facial ROM: Reduced right;Suspected CN VII (facial) dysfunction Facial Symmetry: Abnormal symmetry right;Suspected CN VII (facial) dysfunction Facial Strength: Reduced right;Suspected CN VII (facial) dysfunction Facial Sensation: Within Functional Limits Lingual ROM: Within Functional Limits Lingual Symmetry: Within Functional Limits Lingual Strength: Within Functional Limits Lingual Sensation: Within Functional Limits Velum: Within Functional Limits Mandible: Within Functional Limits   Ice Chips Ice chips: Not tested   Thin Liquid Thin Liquid: Within functional limits Presentation: Straw;Self Fed    Nectar Thick Nectar Thick Liquid: Not tested   Honey Thick Honey Thick Liquid: Not tested   Puree Puree: Not tested   Solid     Solid: Within functional limits Presentation: Self Fed     Harlon Ditty, MA CCC-SLP  Acute Rehabilitation Services Pager [phone] Office [phone]  Haylen Shelnutt, Riley Nearing 06/21/2020,9:57 AM

## 2020-06-21 NOTE — Consult Note (Signed)
Consultation Note Date: 06/21/2020   Patient Name: Nicolas James  DOB: 1933-12-19  MRN: [id number]  Age / Sex: 87 y.o., male  PCP: Pcp, No Referring Physician: Modena Jansky, MD   Reason for Consultation: Establishing goals of care  HPI/Patient Profile: 87 y.o. male  with past medical history of insulin-dependent type 2 diabetes, hypertension, and hypothyroidism admitted on 06/18/2020 with disequilibrium/headache and vomiting. Has had chronic issues with ear infection since Aug 2021 - multiple hospitalizations and multiple visits to ENT. Also diagnosed with Bell's palsy Feb 2022. 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 Not a candidate for surgical intervention. PMT consulted to discuss [place].  Clinical Assessment and Goals of Care: I have reviewed medical records including EPIC notes, labs and imaging, assessed the patient and then met with patient and his daughter, Nicolas James,  to discuss diagnosis prognosis, GOC, EOL wishes, disposition and options.  Nicolas James has difficulty hearing - much of our conversation was completed by writing messages to him and allowing time for him to read and verbally respond.   I introduced Palliative Medicine as specialized medical care for people living with serious illness. It focuses on providing relief from the symptoms and stress of a serious illness. The goal is to improve quality of life for both the patient and the family.  We discussed a brief life review of the patient. 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 He and his wife moved to [place] 20 years ago. She passed away in 04-10-2017. He has 5 children - they all  live in different states but are a tight-knit family.   As far as functional and nutritional status, he was still able to complete ADLs independently prior to admission. He was somewhat unsteady d/t disequilibrium but could still climb stairs in his daughters home. He walked about a mile outside daily. His PO intake had been limited recently d/t complications from infection - he lost about 30 pounds since November; however, appetite has recently started improving. During our meeting he consumed 100% of his lunch without difficulty.  No cognitive changes however communication is difficult r/t hearing loss.    We discussed patient's current illness and what it means in the larger context of patient's on-going co-morbidities.  Natural disease trajectory and expectations at EOL were discussed. We discussed his severe illness. Discussed his disease is progressive and he is not a candidate for surgical intervention. We discussed that he will require long term antibiotic therapy.   I attempted to elicit values and goals of care important to the patient.  Patient would most love to return to his home in [place]; however, he has no family there to help support him. He recognizes that this goal is probably not attainable. He also tells me he wants to "get well!" and "fight". His daughter is supportive of this as well, she tells me the entire family is interested in any intervention that may prolong Nicolas. James's life.   The difference between aggressive medical intervention and comfort care was considered in light of the patient's goals of care. Currently, patient and family are only interested in aggressive medical interventions.   Advance directives, concepts specific to code status, artificial feeding and hydration, and rehospitalization were considered and discussed.  I completed a MOST form today. The patient and family outlined their wishes for the following treatment decisions:  Cardiopulmonary  Resuscitation: Attempt Resuscitation (CPR)  Medical Interventions: Full Scope of Treatment: Use intubation, advanced airway interventions, mechanical ventilation, cardioversion as indicated, medical treatment, IV fluids, etc, also provide comfort measures. Transfer to the hospital if indicated  Antibiotics: Antibiotics if indicated  IV Fluids: IV fluids if indicated  Feeding Tube: Feeding tube for a defined trial period   While Nicolas James does share he is interested in all aggressive interventions offered he does state that he would not want his life maintained long-term on a ventilator or feeding tube. He would never want a trach.   Discussed with patient/family the importance of continued conversation with family and the medical providers regarding overall plan of care and treatment options, ensuring decisions are within the context of the patient's values and GOCs.    Hospice and Palliative Care services outpatient were explained and offered. Their goals are not in line with hospice support at this time. They are agreeable to palliative care follow up outpatient.   Nicolas James also shares that he is having pain in his neck/skull base. He would really like to avoid narcotics. We discuss a trial of scheduled tylenol - 1000 mg TID. He is agreeable to this.  Questions and concerns were addressed. The family was encouraged to call with questions or concerns.   Primary Decision Maker PATIENT   SUMMARY OF RECOMMENDATIONS   - proceed with PICC line with plan for long-term IV antibiotics - family very much wants patient to return to daughter's home with home health - outpatient palliative care referral - scheduled tylenol for neck/skull base pain - 1000 mg TID - MOST completed: full code - would never want trach  Code Status/Advance Care Planning:  Full code  Prognosis:   Unable to determine  Discharge Planning: Home with Home Health      Primary Diagnoses: Present on Admission: .  Mastoiditis   I have reviewed the medical record, interviewed the patient and family, and examined the patient. The following aspects are pertinent.  Past Medical History:  Diagnosis Date  . Diabetes mellitus without complication ([facility])   . Hypertension   . Thyroid disease    Social History   Socioeconomic History  . Marital status: Widowed    Spouse name: Not on file  . Number of children: Not on file  . Years of education: Not on file  . Highest education level: Not on file  Occupational History  . Not on file  Tobacco Use  . Smoking status: Never Smoker  . Smokeless tobacco: Never Used  Substance and Sexual Activity  . Alcohol use: Never  . Drug use: Never  . Sexual activity: Not on file  Other Topics Concern  . Not on file  Social History Narrative  . Not on file   Social Determinants of Health   Financial Resource Strain: Not on file  Food Insecurity: Not on file  Transportation Needs: Not on file  Physical Activity: Not on file  Stress: Not on file  Social Connections: Not on file   History reviewed. No pertinent family history. Scheduled Meds: . acetaminophen  1,000 mg Oral TID  . brimonidine  1 drop Right Eye BID  . insulin aspart  0-5 Units Subcutaneous QHS  . insulin aspart  0-9 Units Subcutaneous TID WC  . insulin detemir  7 Units Subcutaneous QHS  . levothyroxine  88 mcg Oral Q0600  .  lisinopril  2.5 mg Oral QHS  . simvastatin  40 mg Oral QHS  . timolol  1 drop Right Eye BID  . tobramycin-dexamethasone  4 drop Both EARS BID   Continuous Infusions: . sodium chloride 50 mL/hr at 06/20/20 1826  . ceFEPime (MAXIPIME) IV 2 g (06/21/20 1357)  . heparin 1,200 Units/hr (06/21/20 1456)   PRN Meds:.hydrALAZINE, prochlorperazine, Refresh Lacri-Lube No Known Allergies Review of Systems  HENT: Positive for hearing loss.   Neurological: Positive for facial asymmetry and headaches.    Physical Exam Constitutional:      General: He is not in acute  distress. Pulmonary:     Effort: Pulmonary effort is normal.  Skin:    General: Skin is warm and dry.  Neurological:     Mental Status: He is alert and oriented to person, place, and time.     Comments: R side facial droop     Vital Signs: BP (!) 177/81 (BP Location: Right Arm)   Pulse 60   Temp 97.6 F (36.4 C) (Oral)   Resp 17   Ht '5\' 11"'  (1.803 m)   Wt 73.9 kg   SpO2 98%   BMI 22.72 kg/m  Pain Scale: 0-10   Pain Score: 0-No pain   SpO2: SpO2: 98 % O2 Device:SpO2: 98 % O2 Flow Rate: .   IO: Intake/output summary:   Intake/Output Summary (Last 24 hours) at 06/21/2020 1544 Last data filed at 06/21/2020 5409 Gross per 24 hour  Intake 500 ml  Output --  Net 500 ml    LBM: Last BM Date: 06/20/20 Baseline Weight: Weight: 73.9 kg Most recent weight: Weight: 73.9 kg     Palliative Assessment/Data: PPS 70%   Flowsheet Rows   Flowsheet Row Most Recent Value  Intake Tab   Referral Department Hospitalist  Unit at Time of Referral Cardiac/Telemetry Unit  Palliative Care Primary Diagnosis Sepsis/Infectious Disease  Date Notified 06/19/20  Palliative Care Type New Palliative care  Reason for referral Clarify Goals of Care  Date of Admission 06/18/20  Date first seen by Palliative Care 06/20/20  # of days Palliative referral response time 1 Day(s)  # of days IP prior to Palliative referral 1  Clinical Assessment   Psychosocial & Spiritual Assessment   Palliative Care Outcomes       Time Total: 90 minutes Greater than 50%  of this time was spent counseling and coordinating care related to the above assessment and plan.  Juel Burrow, DNP, AGNP-C Palliative Medicine Team [phone] Pager: [phone]

## 2020-06-21 NOTE — Progress Notes (Signed)
ANTICOAGULATION CONSULT NOTE - Follow Up Consult  Pharmacy Consult for IV Heparin Indication: bilateral intracranial venous sinus thromboses  No Known Allergies  Patient Measurements: Height: 5\' 11"  (180.3 cm) Weight: 73.9 kg (162 lb 14.7 oz) IBW/kg (Calculated) : 75.3  Heparin Dosing Weight: 73.9 kg  Vital Signs: Temp: 97.6 F (36.4 C) (06/02 0523) Temp Source: Oral (06/02 0523) BP: 163/80 (06/02 0523) Pulse Rate: 54 (06/02 0523)  Labs: Recent Labs    06/19/20 0224 06/20/20 0923 06/20/20 1950 06/21/20 0520  HGB 11.2* 10.1*  --  10.3*  HCT 35.2* 30.3*  --  31.0*  PLT 489* 456*  --  428*  HEPARINUNFRC  --  <0.10* 0.23* 0.40  CREATININE 0.70 0.73  --  0.74    Estimated Creatinine Clearance: 71.8 mL/min (by C-G formula based on SCr of 0.74 mg/dL).   Medical History: Past Medical History:  Diagnosis Date  . Diabetes mellitus without complication ([facility])   . Hypertension   . Thyroid disease     Assessment: 87 yr old male admitted with skull osteomyelitis/mastoiditis, also found with bilateral intracranial venous sinus thromboses, to begin heparin without bolus per neurology request.  Heparin level ~9 hrs after heparin infusion was increased to 1100 units/hr was 0.23 units/ml, which is below the goal range for this pt. H/H 10.1/30.3, plt 456. Per RN, no issues with IV or bleeding observed.  Per neurology note, plan is to transition pt to apixaban after 3 days of being stable of heparin infusion.   6/2 AM update:  Heparin level therapeutic   Goal of Therapy:  Heparin level: 0.3-0.5 units/ml Monitor platelets by anticoagulation protocol: Yes   Plan:  Cont heparin infusion at 1250 units/hr Check heparin level in 8 hrs Monitor daily heparin level, CBC Monitor for bleeeding F/U transition to apixaban  8/2, PharmD, BCPS Clinical Pharmacist Phone: [phone]

## 2020-06-21 NOTE — Progress Notes (Signed)
Pharmacy Antibiotic Note  Nicolas James is a 87 y.o. male admitted on 06/18/2020 presenting with otomastoiditis with osteomyelitis and possible abscess.  Pharmacy has been consulted for cefepime dosing.  MRI confirmed skull base osteomyelitis/mastoiditis phlegmon.  ID following, planning for at least 6-8 weeks of IV antibiotics and PICC line placement pending GOC discussions. ENT is not planning for any surgical intervention at this time.   WBC 6.5, Tm 98  Plan: Continue Cefepime 2g IV every 8 hours Monitor renal function, clinical progression and ID recs  Height: 5\' 11"  (180.3 cm) Weight: 73.9 kg (162 lb 14.7 oz) IBW/kg (Calculated) : 75.3  Temp (24hrs), Avg:97.7 F (36.5 C), Min:97.6 F (36.4 C), Max:98 F (36.7 C)  Recent Labs  Lab 06/18/20 2047 06/18/20 2147 06/19/20 0224 06/20/20 0923 06/21/20 0520  WBC 7.7  --  8.9 6.2 6.5  CREATININE 0.79  --  0.70 0.73 0.74  LATICACIDVEN  --  1.1  --   --   --     Estimated Creatinine Clearance: 71.8 mL/min (by C-G formula based on SCr of 0.74 mg/dL).    No Known Allergies   Microbiology Data: 5/30 BCx: ngtd  Antimicrobials this Admission: Cefepime 5/30 >>  6/30, PharmD PGY-1 Acute Care Pharmacy Resident 06/21/2020 12:40 PM

## 2020-06-21 NOTE — Care Management Important Message (Signed)
Important Message  Patient Details  Name: Nicolas James MRN: [id number] Date of Birth: 1933-03-17   Medicare Important Message Given:  Yes     Nicolas James 06/21/2020, 2:02 PM

## 2020-06-21 NOTE — Plan of Care (Signed)

## 2020-06-21 NOTE — Progress Notes (Signed)
PROGRESS NOTE   Nicolas James  NLZ:[id number]    DOB: 1933/05/16    DOA: 06/18/2020  PCP: Pcp, No   I have briefly reviewed patients previous medical records in [facility].  Chief Complaint  Patient presents with  . Osteomyelitis     Brief Narrative:  87 year old male with past medical history significant for but not limited to DM, HTN, arthritis, hypothyroidism, bilateral hip and knee replacements, having issues with bilateral ear since August 2021 (ear pain/pressure like symptoms, chronic drainage) admitted on 5/30 due to symptoms of disequilibrium/headache and vomiting.  Patient has been seen by multiple ENT doctors since August 2021 and had been on several courses of antibiotics while living in [place].  S/p right myringotomy tube placement January 2022, developed right facial weakness in February 2022 and was diagnosed with Bell's palsy.  Also underwent left myringotomy tube placement in 03/2020 for middle ear effusion.  Since he was doing poorly by himself in [place], family brought him to [place].  He started following with Dr. Annalee Genta, ENT in early April.  He was given a course of Augmentin and Ciprodex drops for 2 weeks and when seen in office on 4/8, continued to have bilateral otorrhea and right facial weakness.  Ear culture 5/11 grew Pseudomonas aeruginosa resistant to fluoroquinolones.  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  Prior used hearing aids.  Since hospital admission, evaluated by multiple consultants (ENT, ID, neurosurgery, neurology).  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  No surgical intervention or invasive procedures planned.  Palliative care consulted.  PICC line ordered.  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  ID follow-up appreciated and recommend that if after GOC discussion with PMD, patient decides to pursue aggressive care then he will need long-term IV cefepime for at least 6 to 8 weeks or even longer.  PICC line decision pending PMD meeting with family.  PMT consulted but daughter wishes to hold off until she has had a chance to communicate with the patient which has been difficult since he is extremely hard of hearing and all communication has to be via writing.  As per neurology follow-up, recommend that following 3 days of being stable on heparin, recommend transitioning to oral anticoagulation with Eliquis for treatment of cerebral venous sinus thrombosis.  Duration of anticoagulation undetermined at this time and will need to be based on future imaging in the outpatient setting.  Will need follow-up imaging with MRV head with and without contrast in 2 to 4 months with outpatient neurology.  Close to time of discharge, will need to reach out to neurology so an ambulatory referral to for neurology follow-up can be placed. As per palliative care follow-up, patient/family wish to pursue PICC line placement and prolonged IV antibiotics.  PICC line ordered.  ST evaluated, regular diet resumed and patient thankful.  Hopefully can transition to Eliquis 6/4.  Essential hypertension: Mildly uncontrolled at times on lisinopril, continue current regimen.  Type II DM: Reasonably controlled on  Levemir 7 units at bedtime and SSI, continue  Hypomagnesemia: Replaced.  Hyponatremia:?  SIADH.  Stable.  Clinically euvolemic.  Improved.  Anemia of  chronic disease: Stable.  Diplopia, bilateral deafness, facial nerve palsy: All related to above infectious etiology and management as above.  Failure to thrive/goals of care: As per multiple consultants, poor overall prognosis.  Palliative care consultation appreciated.  Patient/family wish to pursue PICC line and long-term IV antibiotics.  Outpatient palliative care referral.  Full code.   Body mass index is 22.72 kg/m.   DVT prophylaxis: SCDs Start: 06/19/20 0109.  Currently on IV heparin drip.   Code Status: Full Code Family Communication: Discussed in detail with patient's daughter at bedside 6/2, updated care and answered all questions Disposition:  Status is: Inpatient  Remains inpatient appropriate because:Inpatient level of care appropriate due to severity of illness   Dispo: The patient is from: Home              Anticipated d/c is to: Home              Patient currently is not medically stable to d/c.       Consultants:   ENT Infectious disease Neurosurgery Neurology Palliative care medicine  Procedures:   None  Antimicrobials:    Anti-infectives (From admission, onward)   Start     Dose/Rate Route Frequency Ordered Stop   06/18/20 2245  ceFEPIme (MAXIPIME) 2 g in sodium chloride 0.9 % 100 mL IVPB        2 g 200 mL/hr over 30 Minutes Intravenous Every 8 hours 06/18/20 2234          Subjective:  Had just completed eating breakfast.  Quite thankful that he got something to eat.  Daughter at bedside.  Overall doing better.  Able to stand up and wash his face, not dizzy or nauseous as before.  Intermittent double vision but had his [employer] patch off today.   Objective:   Vitals:   06/20/20 1732 06/20/20 2117 06/21/20 0523 06/21/20 1424  BP: (!) 152/75 (!) 158/83 (!) 163/80 (!) 177/81  Pulse: (!) 49 (!) 51 (!) 54 60  Resp: Temp: 97.6 F (36.4 C) 98 F (36.7 C) 97.6 F (36.4 C) 97.6 F (36.4 C)  TempSrc: Oral Oral Oral Oral  SpO2: 94%  99% 95% 98%  Weight:      Height:        General exam: Elderly male, moderately built and nourished, sitting up comfortably in reclining chair this morning.  Appears to be in good spirits.  Looks better than yesterday. Respiratory system: Clear to auscultation.  No increased work of breathing. Cardiovascular system: S1 and S2 heard, RRR.  No JVD, murmurs or pedal edema.  Telemetry personally reviewed: Sinus bradycardia in the 50s. Gastrointestinal system: Abdomen is nondistended, soft and nontender. No organomegaly or masses felt. Normal bowel sounds heard. Central nervous system: Alert and oriented.  Diminished right nasolabial fold.  Bilateral deafness.  Did not evaluate EOM movements. Extremities: Symmetric 5 x 5 power. Skin: No rashes, lesions or ulcers Psychiatry: Judgement and insight appear intact. Mood & affect appropriate.     Data Reviewed:   I have personally reviewed following labs and imaging studies   CBC: Recent Labs  Lab 06/18/20 2047 06/19/20 0224 06/20/20 0923 06/21/20 0520  WBC 7.7 8.9 6.2 6.5  NEUTROABS 6.0  --   --   --   HGB 10.4* 11.2* 10.1* 10.3*  HCT 31.4*  35.2* 30.3* 31.0*  MCV 90.2 92.1 88.6 88.6  PLT 484* 489* 456* 428*    Basic Metabolic Panel: Recent Labs  Lab 06/19/20 0224 06/19/20 0929 06/20/20 0923 06/21/20 0520  NA 128*  --  129* 132*  K 3.9  --  3.5 3.8  CL 91*  --  90* 97*  CO2 25  --  31 28  GLUCOSE 223*  --  94 123*  BUN 17  --  14 11  CREATININE 0.70  --  0.73 0.74  CALCIUM 8.4*  --  8.1* 8.3*  MG  --  1.4* 2.0  --     Liver Function Tests: Recent Labs  Lab 06/18/20 2047 06/20/20 0923  AST 16 15  ALT 14 12  ALKPHOS 75 61  BILITOT 0.6 0.4  PROT 5.8* 5.0*  ALBUMIN 2.3* 2.0*    CBG: Recent Labs  Lab 06/20/20 2119 06/21/20 0744 06/21/20 1258  GLUCAP 144* 117* 181*    Microbiology Studies:   Recent Results (from the past 240 hour(s))  Blood culture (routine x 2)     Status: None (Preliminary result)    Collection Time: 06/18/20  8:45 PM   Specimen: BLOOD RIGHT ARM  Result Value Ref Range Status   Specimen Description BLOOD RIGHT ARM  Final   Special Requests   Final    BOTTLES DRAWN AEROBIC AND ANAEROBIC Blood Culture results may not be optimal due to an excessive volume of blood received in culture bottles   Culture   Final    NO GROWTH 3 DAYS Performed at [facility], [address] [place], [place] [address]    Report Status PENDING  Incomplete  Blood culture (routine x 2)     Status: None (Preliminary result)   Collection Time: 06/18/20  8:53 PM   Specimen: BLOOD LEFT ARM  Result Value Ref Range Status   Specimen Description BLOOD LEFT ARM  Final   Special Requests   Final    BOTTLES DRAWN AEROBIC ONLY Blood Culture results may not be optimal due to an excessive volume of blood received in culture bottles   Culture   Final    NO GROWTH 3 DAYS Performed at [facility], [address], [place], [place] [address]    Report Status PENDING  Incomplete  Resp Panel by RT-PCR (Flu A&B, Covid) Nasopharyngeal Swab     Status: None   Collection Time: 06/18/20 10:49 PM   Specimen: Nasopharyngeal Swab; Nasopharyngeal(NP) swabs in vial transport medium  Result Value Ref Range Status   SARS Coronavirus 2 by RT PCR NEGATIVE NEGATIVE Final    Comment: (NOTE) SARS-CoV-2 target nucleic acids are NOT DETECTED.  The SARS-CoV-2 RNA is generally detectable in upper respiratory specimens during the acute phase of infection. The lowest concentration of SARS-CoV-2 viral copies this assay can detect is 138 copies/mL. A negative result does not preclude SARS-Cov-2 infection and should not be used as the sole basis for treatment or other patient management decisions. A negative result may occur with  improper specimen collection/handling, submission of specimen other than nasopharyngeal swab, presence of viral mutation(s) within the areas targeted by this assay, and inadequate number  of viral copies(<138 copies/mL). A negative result must be combined with clinical observations, patient history, and epidemiological information. The expected result is Negative.  Fact Sheet for Patients:  BloggerCourse.com  Fact Sheet for Healthcare Providers:  SeriousBroker.it  This test is no t yet approved or cleared by the [place]  FDA and  has been authorized for detection and/or diagnosis of SARS-CoV-2 by FDA under an Emergency Use Authorization (EUA). This EUA will remain  in effect (meaning this test can be used) for the duration of the COVID-19 declaration under Section 564(b)(1) of the Act, 21 U.S.C.section 360bbb-3(b)(1), unless the authorization is terminated  or revoked sooner.       Influenza A by PCR NEGATIVE NEGATIVE Final   Influenza B by PCR NEGATIVE NEGATIVE Final    Comment: (NOTE) The Xpert Xpress SARS-CoV-2/FLU/RSV plus assay is intended as an aid in the diagnosis of influenza from Nasopharyngeal swab specimens and should not be used as a sole basis for treatment. Nasal washings and aspirates are unacceptable for Xpert Xpress SARS-CoV-2/FLU/RSV testing.  Fact Sheet for Patients: BloggerCourse.comhttps://www.fda.gov/media/152166/download  Fact Sheet for Healthcare Providers: SeriousBroker.ithttps://www.fda.gov/media/152162/download  This test is not yet approved or cleared by the [place] FDA and has been authorized for detection and/or diagnosis of SARS-CoV-2 by FDA under an Emergency Use Authorization (EUA). This EUA will remain in effect (meaning this test can be used) for the duration of the COVID-19 declaration under Section 564(b)(1) of the Act, 21 U.S.C. section 360bbb-3(b)(1), unless the authorization is terminated or revoked.  Performed at [facility], [address] [place], [place] [address]      Radiology Studies:  CT VENOGRAM HEAD  Result Date: 06/20/2020 CLINICAL DATA:  Dural venous sinus  thrombosis suspected.  Infection. EXAM: CT VENOGRAM HEAD TECHNIQUE: Venographic phase images of the head were obtained following the administration of intravenous contrast agent. A noncontrast head CT was performed prior to the administration of IV contrast. CONTRAST:  50mL OMNIPAQUE IOHEXOL 350 MG/ML SOLN COMPARISON:  None. FINDINGS: CT HEAD FINDINGS Brain: There is no mass, hemorrhage or extra-axial collection. There is generalized atrophy without lobar predilection. Hypodensity of the white matter is most commonly associated with chronic microvascular disease. Vascular: Atherosclerotic calcification of the vertebral and internal carotid arteries at the skull base. No abnormal hyperdensity of the major intracranial arteries or dural venous sinuses. Skull: Limited visualization of extracranial soft tissues. No skull fracture. Sinuses/Orbits: Opacification of the sphenoid sinuses and mastoid air cells. Bilateral middle ear opacification. The orbits are normal. CT VENOGRAM FINDINGS There is relatively poor enhancement of the dural venous sinuses on this study. Superior sagittal sinus, straight sinus, transverse sinuses, internal cerebral veins and vein of Galen are patent. The right sigmoid sinus is expanded with some central hypoattenuation which likely indicates thrombus. Assessment of the left sigmoid sinus is more limited. IMPRESSION: 1. Poor enhancement of the dural venous sinuses on this study, with suspected thrombus within the right sigmoid sinus. Assessment of the left sigmoid sinus is more limited. 2. Opacification of the sphenoid sinuses and mastoid air cells and bilateral middle ear cavities. 3. Extensive soft tissue infection at the skull base is incompletely visualized and better characterized on earlier MRI. Electronically Signed   By: Deatra RobinsonKevin  Herman M.D.   On: 06/20/2020 03:55   US EKG SITE RITE  Result Date: 06/21/2020 If Site Rite image not attached, placement could not be confirmed due to current  cardiac rhythm.    Scheduled Meds:   . acetaminophen  1,000 mg Oral TID  . brimonidine  1 drop Right Eye BID  . insulin aspart  0-5 Units Subcutaneous QHS  . insulin aspart  0-9 Units Subcutaneous TID WC  . insulin detemir  7 Units Subcutaneous QHS  . levothyroxine  88 mcg Oral Q0600  . lisinopril  2.5 mg Oral QHS  . simvastatin  40 mg Oral QHS  . timolol  1 drop Right Eye BID  . tobramycin-dexamethasone  4 drop Both EARS BID    Continuous Infusions:   . sodium chloride 50 mL/hr at 06/20/20 1826  . ceFEPime (MAXIPIME) IV 2 g (06/21/20 1357)  . heparin 1,200 Units/hr (06/21/20 1456)     LOS: 3 days     Marcellus Scott, MD, [place], [facility]. [facility]    To contact the attending provider between 7A-7P or the covering provider during after hours 7P-7A, please log into the web site [email or web address] and access using universal [place] password for that web site. If you do not have the password, please call the hospital operator.  06/21/2020, 4:38 PM

## 2020-06-21 NOTE — Progress Notes (Signed)
ANTICOAGULATION CONSULT NOTE - Follow Up Consult  Pharmacy Consult for IV Heparin Indication: bilateral intracranial venous sinus thromboses  No Known Allergies  Patient Measurements: Height: 5\' 11"  (180.3 cm) Weight: 73.9 kg (162 lb 14.7 oz) IBW/kg (Calculated) : 75.3  Heparin Dosing Weight: 73.9 kg  Vital Signs: Temp: 97.6 F (36.4 C) (06/02 1424) Temp Source: Oral (06/02 1424) BP: 177/81 (06/02 1424) Pulse Rate: 60 (06/02 1424)  Labs: Recent Labs    06/19/20 0224 06/20/20 0923 06/20/20 1950 06/21/20 0520  HGB 11.2* 10.1*  --  10.3*  HCT 35.2* 30.3*  --  31.0*  PLT 489* 456*  --  428*  HEPARINUNFRC  --  <0.10* 0.23* 0.40  CREATININE 0.70 0.73  --  0.74    Estimated Creatinine Clearance: 71.8 mL/min (by C-G formula based on SCr of 0.74 mg/dL).   Medical History: Past Medical History:  Diagnosis Date  . Diabetes mellitus without complication (HCC)   . Hypertension   . Thyroid disease     Assessment: 87 yr old male admitted with skull osteomyelitis/mastoiditis, also found with bilateral intracranial venous sinus thromboses, to begin heparin without bolus per neurology request.  Per neurology note, plan is to transition pt to apixaban after 3 days of being stable of heparin infusion.   Afternoon HL slightly above goal at 0.51 (goal 0.3-0.5).  CBC stable.  No signs of bleeding or issues with heparin infusion per RN.  Goal of Therapy:  Heparin level: 0.3-0.5 units/ml Monitor platelets by anticoagulation protocol: Yes   Plan:  Reduce heparin infusion slightly to 1200 units/hr Monitor daily heparin level, CBC Monitor for bleeeding F/U transition to apixaban  97, PharmD PGY-1 Acute Care Pharmacy Resident 06/21/2020 2:32 PM

## 2020-06-21 NOTE — Progress Notes (Signed)
[facility] ([facility])  Hospital Liaison: RN note         This patient has been referred to our palliative care services in the community.  [facility] will continue to follow for any discharge planning needs and to coordinate continuation of palliative care in the outpatient setting.    If you have questions or need assistance, please call [phone] or contact the hospital Liaison listed on AMION.      Thank you for this referral.         Mary Anne Robertson, RN, CCM  [facility] Hospital Liaison   [phone] 

## 2020-06-21 NOTE — TOC Initial Note (Signed)
Transition of Care [facility]) - Initial/Assessment Note    Patient Details  Name: Nicolas James MRN: [id number] Date of Birth: June 28, 1933  Transition of Care [facility]) CM/SW Contact:    Kingsley Plan, RN Phone Number: 06/21/2020, 4:54 PM  Clinical Narrative:                 Sherron Monday to daughter Sherrilyn Rist and son in [place]. Patient cannot hear. Patient staying with them at address in [place]. They will be permanently moving him here to [facility]. Patient does have other children all in different states. Sherrilyn Rist has been keeping everyone up to date.   Patient will need a PCP here in [place]. Sherrilyn Rist would like to arrange her dad to establish care with Dr Farris Has with [facility] at [facility]. NCM called and scheduled appointment for July 05, 2020 at 4:30 pm. Sherrilyn Rist aware and information placed on AVS.   Patient will be going home on IV ABX. Discussed PICC line and patient will have a home health nurse , however, HHRN will not be present every time a dose is due. Infusion company will be [employer]. Pam a nurse with [facility] will provide education to Sherrilyn Rist prior to discharge on IV ABX at home.   Pam has Sherrilyn Rist cell number and will call to schedule a time.   Kandee Keen with [facility] accepted referral for home health RN and will call Sherrilyn Rist directly to answer all home health questions.   Palliative recommending outpatient palliative care. Sherrilyn Rist in agreement. Chales Abrahams with [facility] accepted referral and will have office reach out to [place].    Expected Discharge Plan: Home w Home Health Services     Patient Goals and CMS Choice   CMS Medicare.gov Compare Post Acute Care list provided to:: Patient Choice offered to / list presented to : Patient,Adult Children Sherrilyn Rist)  Expected Discharge Plan and Services Expected Discharge Plan: Home w Home Health Services   Discharge Planning Services: CM Consult   Living arrangements for the past 2 months: Single Family Home                 DME Arranged: N/A DME Agency: NA        HH Arranged: RN HH Agency: [facility] Date [facility] Agency Contacted: 06/21/20 Time HH Agency Contacted: 1653 Representative spoke with at [facility] Agency: Kandee Keen  Prior Living Arrangements/Services Living arrangements for the past 2 months: Single Family Home Lives with:: Adult Children Patient language and need for interpreter reviewed:: Yes Do you feel safe going back to the place where you live?: Yes      Need for Family Participation in Patient Care: Yes (Comment) Care giver support system in place?: Yes (comment)   Criminal Activity/Legal Involvement Pertinent to Current Situation/Hospitalization: No - Comment as needed  Activities of Daily Living      Permission Sought/Granted   Permission granted to share information with : Yes, Verbal Permission Granted  Share Information with NAME: Twana First  Permission granted to share info w AGENCY: [facility], [facility]  Permission granted to share info w Relationship: daughter     Emotional Assessment Appearance:: Appears stated age     Orientation: : Oriented to Self,Oriented to Place,Oriented to  Time,Oriented to Situation Alcohol / Substance Use: Not Applicable Psych Involvement: No (comment)  Admission diagnosis:  Mastoiditis [H70.90] Hypoxia [R09.02] Mastoiditis, unspecified laterality [H70.90] Osteomyelitis, unspecified site, unspecified type [facility]) [M86.9] Patient Active Problem List   Diagnosis Date Noted  . Cerebral venous sinus thrombosis   .  Osteomyelitis of skull (HCC) 06/19/2020  . Hypertensive urgency 06/19/2020  . Anemia 06/19/2020  . Insulin dependent type 2 diabetes mellitus (HCC) 06/19/2020  . Pseudomonas aeruginosa infection 06/19/2020  . Mastoiditis 06/18/2020   PCP:  Pcp, No Pharmacy:   [facility] [address] [place] - [address] [place] [address] Phone: [phone] Fax: [phone]     Social Determinants of Health (SDOH)  Interventions    Readmission Risk Interventions No flowsheet data found.

## 2020-06-21 NOTE — Plan of Care (Signed)

## 2020-06-22 LAB — CBC
HCT: 27.5 % — ABNORMAL LOW (ref 39.0–52.0)
Hemoglobin: 9 g/dL — ABNORMAL LOW (ref 13.0–17.0)
MCH: 29.4 pg (ref 26.0–34.0)
MCHC: 32.7 g/dL (ref 30.0–36.0)
MCV: 89.9 fL (ref 80.0–100.0)
Platelets: 397 10*3/uL (ref 150–400)
RBC: 3.06 MIL/uL — ABNORMAL LOW (ref 4.22–5.81)
RDW: 13.2 % (ref 11.5–15.5)
WBC: 6.1 10*3/uL (ref 4.0–10.5)
nRBC: 0 % (ref 0.0–0.2)

## 2020-06-22 LAB — GLUCOSE, CAPILLARY
Glucose-Capillary: 84 mg/dL (ref 70–99)
Glucose-Capillary: 81 mg/dL (ref 70–99)
Glucose-Capillary: 229 mg/dL — ABNORMAL HIGH (ref 70–99)
Glucose-Capillary: 297 mg/dL — ABNORMAL HIGH (ref 70–99)

## 2020-06-22 LAB — HEPARIN LEVEL (UNFRACTIONATED): Heparin Unfractionated: 0.32 IU/mL (ref 0.30–0.70)

## 2020-06-22 MED ORDER — CEFEPIME IV (FOR PTA / DISCHARGE USE ONLY): 2.0000 g | Freq: Three times a day (TID) | INTRAVENOUS | 0 refills | Status: AC

## 2020-06-22 MED ORDER — LISINOPRIL 5 MG PO TABS
5.0000 mg | ORAL_TABLET | Freq: Every day | ORAL | Status: DC
  Administered 2020-06-22 – 2020-06-23 (×2): 5 mg via ORAL
  Filled 2020-06-22 (×2): qty 1

## 2020-06-22 NOTE — Progress Notes (Signed)
PROGRESS NOTE   Duvall Comes  XHB:[id number]    DOB: [birth year]    DOA: 06/18/2020  PCP: Pcp, No   I have briefly reviewed patients previous medical records in [facility].  Chief Complaint  Patient presents with  . Osteomyelitis     Brief Narrative:  87 year old male with past medical history significant for but not limited to DM, HTN, arthritis, hypothyroidism, bilateral hip and knee replacements, having issues with bilateral ear since August 2021 (ear pain/pressure like symptoms, chronic drainage) admitted on 5/30 due to symptoms of disequilibrium/headache and vomiting.  Patient has been seen by multiple ENT doctors since August 2021 and had been on several courses of antibiotics while living in [place].  S/p right myringotomy tube placement January 2022, developed right facial weakness in February 2022 and was diagnosed with Bell's palsy.  Also underwent left myringotomy tube placement in 03/2020 for middle ear effusion.  Since he was doing poorly by himself in [place], family brought him to [place].  He started following with Dr. Annalee Genta, ENT in early April.  He was given a course of Augmentin and Ciprodex drops for 2 weeks and when seen in office on 4/8, continued to have bilateral otorrhea and right facial weakness.  Ear culture 5/11 grew Pseudomonas aeruginosa resistant to fluoroquinolones.  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  Prior used hearing aids.  Since hospital admission, evaluated by multiple consultants (ENT, ID, neurosurgery, neurology).  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  No surgical intervention or invasive procedures planned.  Palliative care consulted.  PICC line ordered.  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  ID follow-up appreciated and recommend that if after GOC discussion with PMD, patient decides to pursue aggressive care then he will need long-term IV cefepime for at least 6 to 8 weeks or even longer.  PICC line decision pending PMD meeting with family.  PMT consulted but daughter wishes to hold off until she has had a chance to communicate with the patient which has been difficult since he is extremely hard of hearing and all communication has to be via writing.  As per neurology follow-up, recommend that following 3 days of being stable on heparin, recommend transitioning to oral anticoagulation with Eliquis for treatment of cerebral venous sinus thrombosis.  Duration of anticoagulation undetermined at this time and will need to be based on future imaging in the outpatient setting.  Will need follow-up imaging with MRV head with and without contrast in 2 to 4 months with outpatient neurology.  Close to time of discharge, will need to reach out to neurology so an ambulatory referral to for neurology follow-up can be placed. As per palliative care follow-up, patient/family wish to pursue PICC line placement and prolonged IV antibiotics.  PICC line ordered, yet to be placed.  ST evaluated, regular diet resumed and patient thankful.  Hopefully can transition to Eliquis 6/4. ID and ENT have followed up.  Possible discharge home 6/4.  TOC team has  made all necessary arrangements.  Patient and family aware.  Essential hypertension: Mildly uncontrolled on lisinopril 2.5 at bedtime.  Increased to 5 Mg at bedtime.  Continue as needed  hydralazine.  Type II DM: Reasonably controlled on Levemir 7 units at bedtime and SSI, continue  Hypomagnesemia: Replaced.  Hyponatremia:?  SIADH.  Stable.  Clinically euvolemic.  Improved.  Anemia of chronic disease: Stable.  Diplopia, bilateral deafness, facial nerve palsy: All related to above infectious etiology and management as above.  Failure to thrive/goals of care: As per multiple consultants, poor overall prognosis.  Palliative care consultation appreciated.  Patient/family wish to pursue PICC line and long-term IV antibiotics.  Outpatient palliative care referral.  Full code.   Body mass index is 22.72 kg/m.   DVT prophylaxis: SCDs Start: 06/19/20 0109.  Currently on IV heparin drip.   Code Status: Full Code Family Communication: Discussed in detail with patient's daughter at bedside 6/3, updated care and answered all questions Disposition:  Status is: Inpatient  Remains inpatient appropriate because:Inpatient level of care appropriate due to severity of illness   Dispo: The patient is from: Home              Anticipated d/c is to: Home              Patient currently is not medically stable to d/c.       Consultants:   ENT Infectious disease Neurosurgery Neurology Palliative care medicine  Procedures:   None  Antimicrobials:    Anti-infectives (From admission, onward)   Start     Dose/Rate Route Frequency Ordered Stop   06/22/20 0000  ceFEPime (MAXIPIME) IVPB        2 g Intravenous Every 8 hours 06/22/20 1145 07/30/20 2359   06/18/20 2245  ceFEPIme (MAXIPIME) 2 g in sodium chloride 0.9 % 100 mL IVPB        2 g 200 mL/hr over 30 Minutes Intravenous Every 8 hours 06/18/20 2234          Subjective:  Daughter at bedside.  I interacted with patient by writing on it is apparent.  No specific complaints reported.  Eager to discharge home.  Did not like his breakfast this morning.  Objective:   Vitals:   06/21/20 1424 06/21/20 2159 06/22/20 0428  06/22/20 1326  BP: (!) 177/81 (!) 163/70 (!) 179/77 (!) 174/99  Pulse: 60 (!) 58 (!) 47 (!) 47  Resp: 17 18 18 18   Temp: 97.6 F (36.4 C) 98.3 F (36.8 C) 97.8 F (36.6 C) 97.7 F (36.5 C)  TempSrc: Oral Oral Oral Oral  SpO2: 98% 97% 97% 100%  Weight:      Height:        General exam: Elderly male, moderately built and nourished, sitting up comfortably in bed. Respiratory system: Clear to auscultation.  No increased work of breathing. Cardiovascular system: S1 and S2 heard, regular bradycardia.  No murmurs or pedal edema. Gastrointestinal system: Abdomen is nondistended, soft and nontender. No organomegaly or masses felt. Normal bowel sounds heard. Central nervous system: Alert and oriented.  Diminished right nasolabial fold.  Bilateral deafness.  Did not evaluate EOM movements.   Extremities: Symmetric 5 x 5 power. Skin: No rashes, lesions or ulcers Psychiatry: Judgement and insight appear intact. Mood & affect appropriate.     Data Reviewed:   I have personally reviewed following labs and imaging studies   CBC: Recent Labs  Lab 06/18/20 2047 06/19/20 0224 06/20/20 08/20/20 06/21/20 0520 06/22/20 0030  WBC 7.7   < > 6.2 6.5 6.1  NEUTROABS 6.0  --   --   --   --   HGB 10.4*   < > 10.1* 10.3* 9.0*  HCT 31.4*   < > 30.3* 31.0* 27.5*  MCV 90.2   < > 88.6 88.6 89.9  PLT 484*   < > 456* 428* 397   < > = values in this interval not displayed.    Basic Metabolic Panel: Recent Labs  Lab 06/19/20 0224 06/19/20 0929 06/20/20 0923 06/21/20 0520  NA 128*  --  129* 132*  K 3.9  --  3.5 3.8  CL 91*  --  90* 97*  CO2 25  --  31 28  GLUCOSE 223*  --  94 123*  BUN 17  --  14 11  CREATININE 0.70  --  0.73 0.74  CALCIUM 8.4*  --  8.1* 8.3*  MG  --  1.4* 2.0  --     Liver Function Tests: Recent Labs  Lab 06/18/20 2047 06/20/20 0923  AST 16 15  ALT 14 12  ALKPHOS 75 61  BILITOT 0.6 0.4  PROT 5.8* 5.0*  ALBUMIN 2.3* 2.0*    CBG: Recent Labs  Lab 06/21/20 2201  06/22/20 0834 06/22/20 1151  GLUCAP 299* 84 81    Microbiology Studies:   Recent Results (from the past 240 hour(s))  Blood culture (routine x 2)     Status: None (Preliminary result)   Collection Time: 06/18/20  8:45 PM   Specimen: BLOOD RIGHT ARM  Result Value Ref Range Status   Specimen Description BLOOD RIGHT ARM  Final   Special Requests   Final    BOTTLES DRAWN AEROBIC AND ANAEROBIC Blood Culture results may not be optimal due to an excessive volume of blood received in culture bottles   Culture   Final    NO GROWTH 4 DAYS Performed at [facility], [address], [place], [place] [address]    Report Status PENDING  Incomplete  Blood culture (routine x 2)     Status: None (Preliminary result)   Collection Time: 06/18/20  8:53 PM   Specimen: BLOOD LEFT ARM  Result Value Ref Range Status   Specimen Description BLOOD LEFT ARM  Final   Special Requests   Final    BOTTLES DRAWN AEROBIC ONLY Blood Culture results may not be optimal due to an excessive volume of blood received in culture bottles   Culture   Final    NO GROWTH 4 DAYS Performed at [facility], [address] [place], [place] [address]    Report Status PENDING  Incomplete  Resp Panel by RT-PCR (Flu A&B, Covid) Nasopharyngeal Swab     Status: None   Collection Time: 06/18/20 10:49 PM   Specimen: Nasopharyngeal Swab; Nasopharyngeal(NP) swabs in vial transport medium  Result Value Ref Range Status   SARS Coronavirus 2 by RT PCR NEGATIVE NEGATIVE Final    Comment: (NOTE) SARS-CoV-2 target nucleic acids are NOT DETECTED.  The SARS-CoV-2 RNA is generally detectable in upper respiratory specimens during the acute phase of infection. The lowest concentration of SARS-CoV-2 viral copies this assay can detect is 138 copies/mL. A negative result does not preclude SARS-Cov-2 infection and should not be used as the sole basis for treatment or other patient management decisions. A negative result may occur  with  improper specimen collection/handling, submission of specimen other than nasopharyngeal swab, presence of viral mutation(s) within the  areas targeted by this assay, and inadequate number of viral copies(<138 copies/mL). A negative result must be combined with clinical observations, patient history, and epidemiological information. The expected result is Negative.  Fact Sheet for Patients:  BloggerCourse.comhttps://www.fda.gov/media/152166/download  Fact Sheet for Healthcare Providers:  SeriousBroker.ithttps://www.fda.gov/media/152162/download  This test is no t yet approved or cleared by the [place] FDA and  has been authorized for detection and/or diagnosis of SARS-CoV-2 by FDA under an Emergency Use Authorization (EUA). This EUA will remain  in effect (meaning this test can be used) for the duration of the COVID-19 declaration under Section 564(b)(1) of the Act, 21 U.S.C.section 360bbb-3(b)(1), unless the authorization is terminated  or revoked sooner.       Influenza A by PCR NEGATIVE NEGATIVE Final   Influenza B by PCR NEGATIVE NEGATIVE Final    Comment: (NOTE) The Xpert Xpress SARS-CoV-2/FLU/RSV plus assay is intended as an aid in the diagnosis of influenza from Nasopharyngeal swab specimens and should not be used as a sole basis for treatment. Nasal washings and aspirates are unacceptable for Xpert Xpress SARS-CoV-2/FLU/RSV testing.  Fact Sheet for Patients: BloggerCourse.comhttps://www.fda.gov/media/152166/download  Fact Sheet for Healthcare Providers: SeriousBroker.ithttps://www.fda.gov/media/152162/download  This test is not yet approved or cleared by the [place] FDA and has been authorized for detection and/or diagnosis of SARS-CoV-2 by FDA under an Emergency Use Authorization (EUA). This EUA will remain in effect (meaning this test can be used) for the duration of the COVID-19 declaration under Section 564(b)(1) of the Act, 21 U.S.C. section 360bbb-3(b)(1), unless the authorization is terminated  or revoked.  Performed at [facility], [address] [place], [place] [address]      Radiology Studies:  US EKG SITE RITE  Result Date: 06/21/2020 If [facility] not attached, placement could not be confirmed due to current cardiac rhythm.    Scheduled Meds:   . brimonidine  1 drop Right Eye BID  . insulin aspart  0-5 Units Subcutaneous QHS  . insulin aspart  0-9 Units Subcutaneous TID WC  . insulin detemir  7 Units Subcutaneous QHS  . levothyroxine  88 mcg Oral Q0600  . lisinopril  2.5 mg Oral QHS  . simvastatin  40 mg Oral QHS  . timolol  1 drop Right Eye BID  . tobramycin-dexamethasone  4 drop Both EARS BID    Continuous Infusions:   . ceFEPime (MAXIPIME) IV 2 g (06/22/20 1451)  . heparin 1,200 Units/hr (06/21/20 2026)     LOS: 4 days     Marcellus ScottAnand Reyaansh Merlo, MD, [place]ACP, [facility]FHM. [facility]    To contact the attending provider between 7A-7P or the covering provider during after hours 7P-7A, please log into the web site [email or web address] and access using universal [place] password for that web site. If you do not have the password, please call the hospital operator.  06/22/2020, 3:19 PM

## 2020-06-22 NOTE — TOC Progression Note (Addendum)
Transition of Care [facility]) - Progression Note    Patient Details  Name: Dmitri Pettigrew MRN: [id number] Date of Birth: [birth year]  [facility] CM/SW Contact  Nadene Rubins Adria Devon, RN Phone Number: 06/22/2020, 11:06 AM  Clinical Narrative:    Report in progression, possible discharge tomorrow. Sherrilyn Rist aware.   Pam with [facility] aware and has spoken to daughter Sherrilyn Rist and will provide teaching today at bedside between 4 pm and 6 pm.   Kandee Keen with [facility] aware. He left daughter message yesterday and will call her again this morning. 1530 Kandee Keen and Sherrilyn Rist have spoken to each other . [name] asked for HHSW be added to orders , same done .  Misty with [facility] aware and has spoken to [place].    Provided Sherrilyn Rist with 30 day free Eliquis card.  Dr Vincente [place] office requesting DC summary be faxed to them at [phone]      Expected Discharge Plan: Home w Home Health Services    Expected Discharge Plan and Services Expected Discharge Plan: Home w Home Health Services   Discharge Planning Services: CM Consult   Living arrangements for the past 2 months: Single Family Home                 DME Arranged: N/A DME Agency: NA       HH Arranged: RN HH Agency: [facility] Date [facility] Agency Contacted: 06/21/20 Time HH Agency Contacted: 1653 Representative spoke with at [facility] Agency: Kandee Keen   Social Determinants of Health (SDOH) Interventions    Readmission Risk Interventions No flowsheet data found.

## 2020-06-22 NOTE — Progress Notes (Signed)
PHARMACY CONSULT NOTE FOR:  OUTPATIENT  PARENTERAL ANTIBIOTIC THERAPY (OPAT)  Indication: Skull osteomyelitis  Regimen: Cefepime 2 gm IV q 8 hours  End date: 07/30/20  IV antibiotic discharge orders are pended. To discharging provider:  please sign these orders via discharge navigator,  Select New Orders & click on the button choice - Manage This Unsigned Work.     Thank you for allowing pharmacy to be a part of this patient's care.  Sharin Mons, PharmD, BCPS, BCIDP Infectious Diseases Clinical Pharmacist Phone: [phone] 06/22/2020, 11:03 AM

## 2020-06-22 NOTE — Progress Notes (Addendum)
I have seen and examined the patient. I have personally reviewed the clinical findings, laboratory findings, microbiological data and imaging studies. The assessment and treatment plan was discussed with the  Advance Practice Provider, Mauricio Po  I agree with her/his recommendations except following additions/corrections.  Patient was resting in the recliner today. Appeared to be comfortable Palliative care notes reviewed - patient and family agreeable to do PICC line and IV antibiotics  Antibiotic plan has been discussed with the patient    Afebrile, no leukocytosis, hemodynamically stable   PICC line  Continue cefepime for 8 weeks  CBC, BMP, ESR and CRP weekly  OPAT orders in  A follow up with [facility] has been made  Cerebral venous thrombosis management per Neurology  Patient will also need follow up with ENT on discharge Will sign off for now. Please call with questions    Nicolas James, [place] for [place] for Infectious Disease  Date of Admission:  06/18/2020     Total days of antibiotics 5         ASSESSMENT:  Nicolas James plan of care is to continue with aggressive treatment. PICC line has been ordered and will place OPAT/Home Health orders with potential for discharge tomorrow based on anticoagulation recommendations. Plan of care to continue with Cefepime for a minimum of 8 weeks and possibly longer duration and will require additional imaging to determine progress. This plan of care was discussed with Nicolas James as well as his daughter with whom he will be staying. Follow up in ID office.   PLAN:  1. Continue current dose of cefepime for 8 weeks.  2. OPAT/ Home Health orders. 3. [facility] follow up arranged  4. Continue thrombosis management per Neurology recommendations.   Diagnosis:  Base of skull osteomyelitis and phelgmon complicated with venous sinus thrombosis    Culture Result:  Pseudomonas aeruginosa.   No Known Allergies  OPAT Orders Discharge antibiotics to be given via PICC line Discharge antibiotics: Cefepime Per pharmacy protocol   Duration:  8 weeks  End Date:  08/14/20  [facility] Care Per Protocol:  Home health RN for IV administration and teaching; PICC line care and labs.    Labs weekly while on IV antibiotics: _X_ CBC with differential _X_ BMP __ CMP _X_ CRP _X_ ESR __ Vancomycin trough __ CK  __ Please pull PIC at completion of IV antibiotics _X_ Please leave PIC in place until doctor has seen patient or been notified  Fax weekly labs to [phone]  Clinic Follow Up Appt:  6/28 with Dr. West Bali    Principal Problem:   Mastoiditis Active Problems:   Osteomyelitis of skull (HCC)   Pseudomonas aeruginosa infection   Hypertensive urgency   Anemia   Insulin dependent type 2 diabetes mellitus ([place])   Cerebral venous sinus thrombosis   . acetaminophen  1,000 mg Oral TID  . brimonidine  1 drop Right Eye BID  . insulin aspart  0-5 Units Subcutaneous QHS  . insulin aspart  0-9 Units Subcutaneous TID WC  . insulin detemir  7 Units Subcutaneous QHS  . levothyroxine  88 mcg Oral Q0600  . lisinopril  2.5 mg Oral QHS  . simvastatin  40 mg Oral QHS  . timolol  1 drop Right Eye BID  . tobramycin-dexamethasone  4 drop Both EARS BID    SUBJECTIVE:  Afebrile overnight with no acute events. PICC line ordered. Palliative discussion with continued  aggressive medical care. Feeling about the same today. Wanting to go home.   No Known Allergies   Review of Systems: Review of Systems  Constitutional: Negative for chills, fever and weight loss.  Respiratory: Negative for cough, shortness of breath and wheezing.   Cardiovascular: Negative for chest pain and leg swelling.  Gastrointestinal: Negative for abdominal pain, constipation, diarrhea, nausea and vomiting.  Skin: Negative for rash.      OBJECTIVE: Vitals:   06/21/20  0523 06/21/20 1424 06/21/20 2159 06/22/20 0428  BP: (!) 163/80 (!) 177/81 (!) 163/70 (!) 179/77  Pulse: (!) 54 60 (!) 58 (!) 47  Resp: _0 Temp: 97.6 F (36.4 C) 97.6 F (36.4 C) 98.3 F (36.8 C) 97.8 F (36.6 C)  TempSrc: Oral Oral Oral Oral  SpO2: 95% 98% 97% 97%  Weight:      Height:       Body mass index is 22.72 kg/m.  Physical Exam Constitutional:      General: He is not in acute distress.    Appearance: He is well-developed.  Cardiovascular:     Rate and Rhythm: Normal rate and regular rhythm.     Heart sounds: Normal heart sounds.  Pulmonary:     Effort: Pulmonary effort is normal.     Breath sounds: Normal breath sounds.  Skin:    General: Skin is warm and dry.  Neurological:     Mental Status: He is alert and oriented to person, place, and time.  Psychiatric:        Behavior: Behavior normal.        Thought Content: Thought content normal.        Judgment: Judgment normal.     Lab Results Lab Results  Component Value Date   WBC 6.1 06/22/2020   HGB 9.0 (L) 06/22/2020   HCT 27.5 (L) 06/22/2020   MCV 89.9 06/22/2020   PLT 397 06/22/2020    Lab Results  Component Value Date   CREATININE 0.74 06/21/2020   BUN 11 06/21/2020   NA 132 (L) 06/21/2020   K 3.8 06/21/2020   CL 97 (L) 06/21/2020   CO2 28 06/21/2020    Lab Results  Component Value Date   ALT 12 06/20/2020   AST 15 06/20/2020   ALKPHOS 61 06/20/2020   BILITOT 0.4 06/20/2020     Microbiology: Recent Results (from the past 240 hour(s))  Blood culture (routine x 2)     Status: None (Preliminary result)   Collection Time: 06/18/20  8:45 PM   Specimen: BLOOD RIGHT ARM  Result Value Ref Range Status   Specimen Description BLOOD RIGHT ARM  Final   Special Requests   Final    BOTTLES DRAWN AEROBIC AND ANAEROBIC Blood Culture results may not be optimal due to an excessive volume of blood received in culture bottles   Culture   Final    NO GROWTH 4 DAYS Performed at [facility], [place] [address] [place], [place] [address]    Report Status PENDING  Incomplete  Blood culture (routine x 2)     Status: None (Preliminary result)   Collection Time: 06/18/20  8:53 PM   Specimen: BLOOD LEFT ARM  Result Value Ref Range Status   Specimen Description BLOOD LEFT ARM  Final   Special Requests   Final    BOTTLES DRAWN AEROBIC ONLY Blood Culture results may not be optimal due to an excessive volume of blood received in culture bottles  Culture   Final    NO GROWTH 4 DAYS Performed at [facility], [place] [address] [place], [place] [address]    Report Status PENDING  Incomplete  Resp Panel by RT-PCR (Flu A&B, Covid) Nasopharyngeal Swab     Status: None   Collection Time: 06/18/20 10:49 PM   Specimen: Nasopharyngeal Swab; Nasopharyngeal(NP) swabs in vial transport medium  Result Value Ref Range Status   SARS Coronavirus 2 by RT PCR NEGATIVE NEGATIVE Final    Comment: (NOTE) SARS-CoV-2 target nucleic acids are NOT DETECTED.  The SARS-CoV-2 RNA is generally detectable in upper respiratory specimens during the acute phase of infection. The lowest concentration of SARS-CoV-2 viral copies this assay can detect is 138 copies/mL. A negative result does not preclude SARS-Cov-2 infection and should not be used as the sole basis for treatment or other patient management decisions. A negative result may occur with  improper specimen collection/handling, submission of specimen other than nasopharyngeal swab, presence of viral mutation(s) within the areas targeted by this assay, and inadequate number of viral copies(<138 copies/mL). A negative result must be combined with clinical observations, patient history, and epidemiological information. The expected result is Negative.  Fact Sheet for Patients:  EntrepreneurPulse.com.au  Fact Sheet for Healthcare Providers:  IncredibleEmployment.be  This test is no t yet approved or cleared  by the [place] FDA and  has been authorized for detection and/or diagnosis of SARS-CoV-2 by FDA under an Emergency Use Authorization (EUA). This EUA will remain  in effect (meaning this test can be used) for the duration of the COVID-19 declaration under Section 564(b)(1) of the Act, 21 U.S.C.section 360bbb-3(b)(1), unless the authorization is terminated  or revoked sooner.       Influenza A by PCR NEGATIVE NEGATIVE Final   Influenza B by PCR NEGATIVE NEGATIVE Final    Comment: (NOTE) The Xpert Xpress SARS-CoV-2/FLU/RSV plus assay is intended as an aid in the diagnosis of influenza from Nasopharyngeal swab specimens and should not be used as a sole basis for treatment. Nasal washings and aspirates are unacceptable for Xpert Xpress SARS-CoV-2/FLU/RSV testing.  Fact Sheet for Patients: EntrepreneurPulse.com.au  Fact Sheet for Healthcare Providers: IncredibleEmployment.be  This test is not yet approved or cleared by the [place] FDA and has been authorized for detection and/or diagnosis of SARS-CoV-2 by FDA under an Emergency Use Authorization (EUA). This EUA will remain in effect (meaning this test can be used) for the duration of the COVID-19 declaration under Section 564(b)(1) of the Act, 21 U.S.C. section 360bbb-3(b)(1), unless the authorization is terminated or revoked.  Performed at [facility], [place] [address] [place],  [address]      Terri Piedra, [place] for Infectious Disease [facility]  06/22/2020  10:42 AM

## 2020-06-22 NOTE — Progress Notes (Signed)
   ENT Progress Note: HD #5   Subjective: Patient stable, somewhat lightheaded when sitting up.  No headache  Objective: Vital signs in last 24 hours: Temp:  [97.6 F (36.4 C)-98.3 F (36.8 C)] 97.8 F (36.6 C) (06/03 0428) Pulse Rate:  [47-60] 47 (06/03 0428) Resp:  [17-18] 18 (06/03 0428) BP: (163-179)/(70-81) 179/77 (06/03 0428) SpO2:  [97 %-98 %] 97 % (06/03 0428) Weight change:  Last BM Date: 06/20/20  Intake/Output from previous day: 06/02 0701 - 06/03 0700 In: 795.7 [I.V.:595.7; IV Piggyback:200] Out: -  Intake/Output this shift: No intake/output data recorded.  Labs: Recent Labs    06/21/20 0520 06/22/20 0030  WBC 6.5 6.1  HGB 10.3* 9.0*  HCT 31.0* 27.5*  PLT 428* 397   Recent Labs    06/20/20 0923 06/21/20 0520  NA 129* 132*  K 3.5 3.8  CL 90* 97*  CO2 31 28  GLUCOSE 94 123*  BUN 14 11  CALCIUM 8.1* 8.3*    Studies/Results: [place] EKG SITE RITE  Result Date: 06/21/2020 If Site Rite image not attached, placement could not be confirmed due to current cardiac rhythm.    PHYSICAL EXAM: No change in physical exam, minimal otorrhea.   Assessment/Plan: Patient stable on current therapy.  Plan for PICC line placement and outpatient long-term antibiotic therapy per infectious disease.  Patient also on anticoagulant therapy for bilateral sigmoid sinus/venous thrombosis.  Plan discharge on recommended antibiotic therapy and bilateral TobraDex eardrops.  Anticipate follow-up as an outpatient in my office at [facility] ENT approximately 2 weeks after discharge with continued ongoing follow-up.  Would also plan follow-up imaging studies to assess improvement over time.  Patient will need family teaching and home health care for long-term antibiotic therapy.    Osborn Coho 06/22/2020, 9:07 AM

## 2020-06-22 NOTE — Progress Notes (Signed)
ANTICOAGULATION CONSULT NOTE - Follow Up Consult  Pharmacy Consult for Heparin Indication: BL venous sinus thrombosis  No Known Allergies  Patient Measurements: Height: 5\' 11"  (180.3 cm) Weight: 73.9 kg (162 lb 14.7 oz) IBW/kg (Calculated) : 75.3 Heparin Dosing Weight: 73.9kg  Vital Signs: Temp: 97.8 F (36.6 C) (06/03 0428) Temp Source: Oral (06/03 0428) BP: 179/77 (06/03 0428) Pulse Rate: 47 (06/03 0428)  Labs: Recent Labs    06/20/20 0923 06/20/20 1950 06/21/20 0520 06/21/20 1357 06/22/20 0030  HGB 10.1*  --  10.3*  --  9.0*  HCT 30.3*  --  31.0*  --  27.5*  PLT 456*  --  428*  --  397  HEPARINUNFRC <0.10*   < > 0.40 0.51 0.32  CREATININE 0.73  --  0.74  --   --    < > = values in this interval not displayed.    Estimated Creatinine Clearance: 71.8 mL/min (by C-G formula based on SCr of 0.74 mg/dL).   Assessment: Anticoag: Heparin for BL venous sinus thrombus, d-dimer 2.56.   - Hep level 0.32 in goal. Hgb down to 9, Plts 397  Goal of Therapy:  Heparin level 0.3-0.5 units/ml Monitor platelets by anticoagulation protocol: Yes   Plan:  Cont heparin 1200 units/hr Daily heparin level and CBC Per MD, change to Eliquis soon if pt stable on heparin (6/4)   Ivon Oelkers S. [birth year]-26-1979, PharmD, BCPS Clinical Staff Pharmacist [email or web address] Merilynn Finland, Ragna Kramlich Stillinger 06/22/2020,8:35 AM

## 2020-06-22 NOTE — Progress Notes (Signed)
This RN informed pts daughter, Nicolas James, about [facility] visitation policy. Ms. Nicolas James was not happy with that policy and requested a MDs order to stay overnight. This RN informed MD on-call about the concern. Request was denied due to pt being alert and oriented x4 and cognitively able to make decisions and follow commands. This RN Informed Ms. Nicolas James that she will not be able to stay overnight. Pt daughter refused to leave. This RN and Press photographer, Clarita Crane, Charity fundraiser, at bedside educating the pt on visitation policy and informed pt that she can return in the am during visitation hours and talk with am Doctors regarding concerns to stay overnight. Pt daughter became verbally aggressive. This RN informed Ms. Nicolas James that security would have to be called due to behavior. Pts daughter left unit. This RN was able to answer pts questions and concerns regarding policy, pt verbalized understanding.

## 2020-06-22 NOTE — Progress Notes (Signed)
Daily Progress Note   Patient Name: Nicolas James       Date: 06/22/2020 DOB: Nov 12, 1933  Age: 87 y.o. MRN#: [id number] Attending Physician: Elease Etienne, MD Primary Care Physician: Pcp, No Admit Date: 06/18/2020  Reason for Consultation/Follow-up: Establishing goals of care  Subjective: Patient asleep. Daughter at bedside - reports he had a rough night and morning - wants to allow him to rest.   Length of Stay: 4  Current Medications: Scheduled Meds:  . brimonidine  1 drop Right Eye BID  . insulin aspart  0-5 Units Subcutaneous QHS  . insulin aspart  0-9 Units Subcutaneous TID WC  . insulin detemir  7 Units Subcutaneous QHS  . levothyroxine  88 mcg Oral Q0600  . lisinopril  2.5 mg Oral QHS  . simvastatin  40 mg Oral QHS  . timolol  1 drop Right Eye BID  . tobramycin-dexamethasone  4 drop Both EARS BID    Continuous Infusions: . ceFEPime (MAXIPIME) IV 2 g (06/22/20 0601)  . heparin 1,200 Units/hr (06/21/20 2026)    PRN Meds: hydrALAZINE, prochlorperazine, Refresh Lacri-Lube  Physical Exam Constitutional:      General: He is not in acute distress. Pulmonary:     Effort: Pulmonary effort is normal.  Skin:    General: Skin is warm and dry.             Vital Signs: BP (!) 179/77 (BP Location: Right Arm)   Pulse (!) 47   Temp 97.8 F (36.6 C) (Oral)   Resp 18   Ht 5\' 11"  (1.803 m)   Wt 73.9 kg   SpO2 97%   BMI 22.72 kg/m  SpO2: SpO2: 97 % O2 Device: O2 Device: Room Air O2 Flow Rate:    Intake/output summary:   Intake/Output Summary (Last 24 hours) at 06/22/2020 1211 Last data filed at 06/22/2020 0448 Gross per 24 hour  Intake 795.66 ml  Output --  Net 795.66 ml   LBM: Last BM Date: 06/20/20 Baseline Weight: Weight: 73.9 kg Most recent weight: Weight: 73.9 kg        Palliative Assessment/Data: PPS 70%    Flowsheet Rows   Flowsheet Row Most Recent Value  Intake Tab   Referral Department Hospitalist  Unit at Time of Referral Cardiac/Telemetry Unit  Palliative Care Primary Diagnosis Sepsis/Infectious Disease  Date Notified 06/19/20  Palliative Care Type New Palliative care  Reason for referral Clarify Goals of Care  Date of Admission 06/18/20  Date first seen by Palliative Care 06/20/20  # of days Palliative referral response time 1 Day(s)  # of days IP prior to Palliative referral 1  Clinical Assessment   Psychosocial & Spiritual Assessment   Palliative Care Outcomes       Patient Active Problem List   Diagnosis Date Noted  . Cerebral venous sinus thrombosis   . Osteomyelitis of skull (HCC) 06/19/2020  . Hypertensive urgency 06/19/2020  . Anemia 06/19/2020  . Insulin dependent type 2 diabetes mellitus (HCC) 06/19/2020  . Pseudomonas aeruginosa infection 06/19/2020  . Mastoiditis 06/18/2020    Palliative Care Assessment & Plan   HPI: 87 y.o. male  with past medical history of insulin-dependent type 2 diabetes, hypertension, and  hypothyroidism admitted on 06/18/2020 with disequilibrium/headache and vomiting. Has had chronic issues with ear infection since Aug 2021 - multiple hospitalizations and multiple visits to ENT. Also diagnosed with Bell's palsy Feb 2022. 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 Not a candidate for surgical intervention. PMT consulted to discuss GOC.  Assessment: Patient resting well. Daughter reports discharge planning is ongoing and they are both comfortable with plan - all concerns addressed by TOC. Patient told daughter he did not want to take tylenol - he thinks it gave him a headache. He does not like taking medications. Will dc per his request.    Recommendations/Plan: proceed with PICC line with plan for long-term IV antibiotics - family very much wants patient to return to daughter's home with home health - outpatient palliative care referral - dc tylenol per pt request - MOST completed: full code - would never want trach  Code Status:  Full code  Prognosis:   Unable to determine  Discharge Planning:  Home with Home Health, outpatient palliative  Care plan was discussed with daughter and [facility]  Thank you for allowing the Palliative Medicine Team to assist in the care of this patient.   Total Time 15 minutes Prolonged Time Billed  no       Greater than 50%  of this time was spent counseling and coordinating care related to the above assessment and plan.  Gerlean Ren, DNP, [facility] Palliative Medicine Team Team Phone # [phone]  Pager [phone]

## 2020-06-22 NOTE — Plan of Care (Signed)

## 2020-06-23 LAB — CULTURE, BLOOD (ROUTINE X 2)
Culture: NO GROWTH
Culture: NO GROWTH

## 2020-06-23 LAB — GLUCOSE, CAPILLARY
Glucose-Capillary: 168 mg/dL — ABNORMAL HIGH (ref 70–99)
Glucose-Capillary: 203 mg/dL — ABNORMAL HIGH (ref 70–99)
Glucose-Capillary: 302 mg/dL — ABNORMAL HIGH (ref 70–99)
Glucose-Capillary: 293 mg/dL — ABNORMAL HIGH (ref 70–99)

## 2020-06-23 LAB — CBC
HCT: 35.3 % — ABNORMAL LOW (ref 39.0–52.0)
Hemoglobin: 11.6 g/dL — ABNORMAL LOW (ref 13.0–17.0)
MCH: 29.6 pg (ref 26.0–34.0)
MCHC: 32.9 g/dL (ref 30.0–36.0)
MCV: 90.1 fL (ref 80.0–100.0)
Platelets: 453 10*3/uL — ABNORMAL HIGH (ref 150–400)
RBC: 3.92 MIL/uL — ABNORMAL LOW (ref 4.22–5.81)
RDW: 13.4 % (ref 11.5–15.5)
WBC: 7.6 10*3/uL (ref 4.0–10.5)
nRBC: 0 % (ref 0.0–0.2)

## 2020-06-23 LAB — HEPARIN LEVEL (UNFRACTIONATED): Heparin Unfractionated: 0.47 IU/mL (ref 0.30–0.70)

## 2020-06-23 MED ORDER — SODIUM CHLORIDE 0.9% FLUSH: 10.0000 mL | INTRAVENOUS | Status: DC | PRN

## 2020-06-23 MED ORDER — SODIUM CHLORIDE 0.9% FLUSH
10.0000 mL | Freq: Two times a day (BID) | INTRAVENOUS | Status: DC
  Administered 2020-06-23 (×2): 10 mL

## 2020-06-23 MED ORDER — WHITE PETROLATUM EX OINT
TOPICAL_OINTMENT | CUTANEOUS | Status: AC
  Filled 2020-06-23: qty 28.35

## 2020-06-23 MED ORDER — CHLORHEXIDINE GLUCONATE CLOTH 2 % EX PADS
6.0000 | MEDICATED_PAD | Freq: Every day | CUTANEOUS | Status: DC
  Administered 2020-06-23 – 2020-06-24 (×2): 6 via TOPICAL

## 2020-06-23 NOTE — Progress Notes (Signed)
F/U on PICC site.  Further decrease in swelling noted, remains without discoloration.

## 2020-06-23 NOTE — Progress Notes (Signed)
Peripherally Inserted Central Catheter Placement  The IV Nurse has discussed with the patient and/or persons authorized to consent for the patient, the purpose of this procedure and the potential benefits and risks involved with this procedure.  The benefits include less needle sticks, lab draws from the catheter, and the patient may be discharged home with the catheter. Risks include, but not limited to, infection, bleeding, blood clot (thrombus formation), and puncture of an artery; nerve damage and irregular heartbeat and possibility to perform a PICC exchange if needed/ordered by physician.  Alternatives to this procedure were also discussed.  Bard Power PICC patient education guide, fact sheet on infection prevention and patient information card has been provided to patient /or left at bedside.  Dtr at bedside also agreeable with procedure.  Communication with pt with dry erase board.  PICC Placement Documentation  PICC Single Lumen 06/23/20 Right Brachial 40 cm 0 cm (Active)  Indication for Insertion or Continuance of Line Home intravenous therapies (PICC only) 06/23/20 0952  Exposed Catheter (cm) 0 cm 06/23/20 5009  Site Assessment Dry;Clean;Intact 06/23/20 0952  Line Status Flushed;Saline locked;Blood return noted 06/23/20 0952  Dressing Type Transparent 06/23/20 0952  Dressing Status Clean;Dry;Intact 06/23/20 0952  Antimicrobial disc in place? Yes 06/23/20 0952  Safety Lock Not Applicable 06/23/20 0952  Line Care Connections checked and tightened 06/23/20 0952  Line Adjustment (NICU/IV Team Only) No 06/23/20 0952  Dressing Intervention New dressing 06/23/20 0952  Dressing Change Due 06/30/20 06/23/20 0952       Nicolas James 06/23/2020, 9:53 AM

## 2020-06-23 NOTE — Progress Notes (Signed)
Pt's BP was 172/73, Hydralazine given per order, will recheck BP.

## 2020-06-23 NOTE — Progress Notes (Signed)
Accessed brachial artery during PICC procedure.  Firm pressure held x 10- 15 minutes, no further swelling noted during procedure, ice pack applied post procedure.  Pt, dtr, Paulette RN and Dr Waymon Amato notified of event and interventions. No visible bruising noted at the site.

## 2020-06-23 NOTE — Progress Notes (Signed)
PROGRESS NOTE   Nicolas James  KKX:[id number]    DOB: 1933/03/25    DOA: 06/18/2020  PCP: Pcp, No   I have briefly reviewed patients previous medical records in [facility].  Chief Complaint  Patient presents with  . Osteomyelitis     Brief Narrative:  87 year old male with past medical history significant for but not limited to DM, HTN, arthritis, hypothyroidism, bilateral hip and knee replacements, having issues with bilateral ear since August 2021 (ear pain/pressure like symptoms, chronic drainage) admitted on 5/30 due to symptoms of disequilibrium/headache and vomiting.  Patient has been seen by multiple ENT doctors since August 2021 and had been on several courses of antibiotics while living in [place].  S/p right myringotomy tube placement January 2022, developed right facial weakness in February 2022 and was diagnosed with Bell's palsy.  Also underwent left myringotomy tube placement in 03/2020 for middle ear effusion.  Since he was doing poorly by himself in [place], family brought him to [place].  He started following with Dr. Annalee Genta, ENT in early April.  He was given a course of Augmentin and Ciprodex drops for 2 weeks and when seen in office on 4/8, continued to have bilateral otorrhea and right facial weakness.  Ear culture 5/11 grew Pseudomonas aeruginosa resistant to fluoroquinolones.  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  Prior used hearing aids.  Since hospital admission, evaluated by multiple consultants (ENT, ID, neurosurgery, neurology).  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  No surgical intervention or invasive procedures planned.  Palliative care consulted.  RUE PICC line placed 6/4 complicated by accidental brachial artery puncture and small hematoma, thereby holding off on switching from IV heparin to Eliquis for additional 24 hours to make sure there is no worsening of  hematoma.  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  ID follow-up appreciated and recommend that if after GOC discussion with PMD, patient decides to pursue aggressive care then he will need long-term IV cefepime for at least 6 to 8 weeks or even longer.  PICC line decision pending PMD meeting with family.  PMT consulted but daughter wishes to hold off until she has had a chance to communicate with the patient which has been difficult since he is extremely hard of hearing and all communication has to be via writing.  As per neurology follow-up, recommend that following 3 days of being stable on heparin, recommend transitioning to oral anticoagulation with Eliquis for treatment of cerebral venous sinus thrombosis.  Duration of anticoagulation undetermined at this time and will need to be based on future imaging in the outpatient setting.  Will need follow-up imaging with MRV head with and without contrast in 2 to 4 months with outpatient neurology.  Close to time of discharge, will need to reach out to neurology so an ambulatory referral to for neurology follow-up can be placed. As per palliative care follow-up, patient/family wish to pursue PICC line placement and prolonged IV antibiotics.  ST evaluated, regular diet  resumed and patient thankful.  Hopefully can transition to Eliquis 6/4. ID and ENT have followed up.  Possible discharge home 6/5.  TOC team has made all necessary arrangements.   Patient and family aware. Plans were for discharge home today.  However during placement of PICC line, accidental puncture of brachial artery with small hematoma.  Thereby monitoring for additional 24 hours on IV heparin to make sure that the hematoma does not worsen prior to switching to Eliquis tomorrow and discharge.  Patient and daughter agreeable.  Essential hypertension: Was uncontrolled and hence increased lisinopril 5 mg at bedtime on 6/3.  Better this afternoon.  Monitor closely and may have to increase further.  Type II DM: Mildly uncontrolled and fluctuating.  Continue current dose of Levemir and SSI.  Hypomagnesemia: Replaced.  Hyponatremia:?  SIADH.  Stable.  Clinically euvolemic.  Improved.  Outpatient follow-up.  Anemia of chronic disease: Stable.  Diplopia, bilateral deafness, facial nerve palsy: All related to above infectious etiology and management as above.  Failure to thrive/goals of care: As per multiple consultants, poor overall prognosis.  Palliative care consultation appreciated.  Patient/family wish to pursue PICC line and long-term IV antibiotics.  Outpatient palliative care referral.  Full code.   Body mass index is 22.72 kg/m.   DVT prophylaxis: SCDs Start: 06/19/20 0109.  Currently on IV heparin drip.   Code Status: Full Code Family Communication: Discussed in detail with patient's daughter at bedside 6/4, updated care and answered all questions Disposition:  Status is: Inpatient  Remains inpatient appropriate because:Inpatient level of care appropriate due to severity of illness   Dispo: The patient is from: Home              Anticipated d/c is to: Home              Patient currently is not medically stable to d/c.       Consultants:   ENT Infectious disease Neurosurgery Neurology Palliative care medicine  Procedures:   None  Antimicrobials:    Anti-infectives (From admission, onward)   Start     Dose/Rate Route Frequency Ordered Stop    06/22/20 0000  ceFEPime (MAXIPIME) IVPB        2 g Intravenous Every 8 hours 06/22/20 1145 07/30/20 2359   06/18/20 2245  ceFEPIme (MAXIPIME) 2 g in sodium chloride 0.9 % 100 mL IVPB        2 g 200 mL/hr over 30 Minutes Intravenous Every 8 hours 06/18/20 2234          Subjective:  Patient seen this morning after placement of PICC line.  Noted small swelling medial aspect of right upper arm due to hematoma.  No pain.  No tingling, numbness of upper extremity reported.  Disappointed that he cannot go home today.  Objective:   Vitals:   06/22/20 1326 06/22/20 2110 06/23/20 0624 06/23/20 1535  BP: (!) 174/99 (!) 168/67 (!) 172/73 (!) 125/57  Pulse: (!) 47 (!) 55 (!) 49 (!) 59  Resp: 18 18 17 17   Temp: 97.7 F (36.5 C) 98.2 F (36.8 C) (!) 97.5 F (36.4 C) 97.8 F (36.6 C)  TempSrc: Oral Oral Oral Oral  SpO2: 100% 97% 98% 98%  Weight:      Height:        General exam: Elderly male, moderately built and nourished, seen ambulating steadily by himself with the help of a walker from the bathroom to his hospital bed. Respiratory system: Clear to auscultation.  No increased  work of breathing. Cardiovascular system: S1 and S2 heard, regular bradycardia.  No murmurs or pedal edema. Gastrointestinal system: Abdomen is nondistended, soft and nontender. No organomegaly or masses felt. Normal bowel sounds heard. Central nervous system: Alert and oriented.  Diminished right nasolabial fold.  Bilateral deafness.  Did not evaluate EOM movements.   Extremities: Symmetric 5 x 5 power.  Right upper arm PICC line in place and a small hematoma superior to medial to it.  Non pulsatile. Skin: No rashes, lesions or ulcers Psychiatry: Judgement and insight appear intact. Mood & affect appropriate.     Data Reviewed:   I have personally reviewed following labs and imaging studies   CBC: Recent Labs  Lab 06/18/20 2047 06/19/20 0224 06/21/20 0520 06/22/20 0030 06/23/20 0714  WBC 7.7   < >  6.5 6.1 7.6  NEUTROABS 6.0  --   --   --   --   HGB 10.4*   < > 10.3* 9.0* 11.6*  HCT 31.4*   < > 31.0* 27.5* 35.3*  MCV 90.2   < > 88.6 89.9 90.1  PLT 484*   < > 428* 397 453*   < > = values in this interval not displayed.    Basic Metabolic Panel: Recent Labs  Lab 06/19/20 0224 06/19/20 0929 06/20/20 0923 06/21/20 0520  NA 128*  --  129* 132*  K 3.9  --  3.5 3.8  CL 91*  --  90* 97*  CO2 25  --  31 28  GLUCOSE 223*  --  94 123*  BUN 17  --  14 11  CREATININE 0.70  --  0.73 0.74  CALCIUM 8.4*  --  8.1* 8.3*  MG  --  1.4* 2.0  --     Liver Function Tests: Recent Labs  Lab 06/18/20 2047 06/20/20 0923  AST 16 15  ALT 14 12  ALKPHOS 75 61  BILITOT 0.6 0.4  PROT 5.8* 5.0*  ALBUMIN 2.3* 2.0*    CBG: Recent Labs  Lab 06/22/20 2108 06/23/20 0756 06/23/20 1209  GLUCAP 297* 168* 203*    Microbiology Studies:   Recent Results (from the past 240 hour(s))  Blood culture (routine x 2)     Status: None   Collection Time: 06/18/20  8:45 PM   Specimen: BLOOD RIGHT ARM  Result Value Ref Range Status   Specimen Description BLOOD RIGHT ARM  Final   Special Requests   Final    BOTTLES DRAWN AEROBIC AND ANAEROBIC Blood Culture results may not be optimal due to an excessive volume of blood received in culture bottles   Culture   Final    NO GROWTH 5 DAYS Performed at [facility], [address] [place], [place] [address]    Report Status 06/23/2020 FINAL  Final  Blood culture (routine x 2)     Status: None   Collection Time: 06/18/20  8:53 PM   Specimen: BLOOD LEFT ARM  Result Value Ref Range Status   Specimen Description BLOOD LEFT ARM  Final   Special Requests   Final    BOTTLES DRAWN AEROBIC ONLY Blood Culture results may not be optimal due to an excessive volume of blood received in culture bottles   Culture   Final    NO GROWTH 5 DAYS Performed at [facility], [address] [place], [place] [address]    Report Status 06/23/2020 FINAL  Final   Resp Panel by RT-PCR (Flu A&B, Covid) Nasopharyngeal Swab  Status: None   Collection Time: 06/18/20 10:49 PM   Specimen: Nasopharyngeal Swab; Nasopharyngeal(NP) swabs in vial transport medium  Result Value Ref Range Status   SARS Coronavirus 2 by RT PCR NEGATIVE NEGATIVE Final    Comment: (NOTE) SARS-CoV-2 target nucleic acids are NOT DETECTED.  The SARS-CoV-2 RNA is generally detectable in upper respiratory specimens during the acute phase of infection. The lowest concentration of SARS-CoV-2 viral copies this assay can detect is 138 copies/mL. A negative result does not preclude SARS-Cov-2 infection and should not be used as the sole basis for treatment or other patient management decisions. A negative result may occur with  improper specimen collection/handling, submission of specimen other than nasopharyngeal swab, presence of viral mutation(s) within the areas targeted by this assay, and inadequate number of viral copies(<138 copies/mL). A negative result must be combined with clinical observations, patient history, and epidemiological information. The expected result is Negative.  Fact Sheet for Patients:  BloggerCourse.com  Fact Sheet for Healthcare Providers:  SeriousBroker.it  This test is no t yet approved or cleared by the [place] FDA and  has been authorized for detection and/or diagnosis of SARS-CoV-2 by FDA under an Emergency Use Authorization (EUA). This EUA will remain  in effect (meaning this test can be used) for the duration of the COVID-19 declaration under Section 564(b)(1) of the Act, 21 U.S.C.section 360bbb-3(b)(1), unless the authorization is terminated  or revoked sooner.       Influenza A by PCR NEGATIVE NEGATIVE Final   Influenza B by PCR NEGATIVE NEGATIVE Final    Comment: (NOTE) The Xpert Xpress SARS-CoV-2/FLU/RSV plus assay is intended as an aid in the diagnosis of influenza from  Nasopharyngeal swab specimens and should not be used as a sole basis for treatment. Nasal washings and aspirates are unacceptable for Xpert Xpress SARS-CoV-2/FLU/RSV testing.  Fact Sheet for Patients: BloggerCourse.com  Fact Sheet for Healthcare Providers: SeriousBroker.it  This test is not yet approved or cleared by the [place] FDA and has been authorized for detection and/or diagnosis of SARS-CoV-2 by FDA under an Emergency Use Authorization (EUA). This EUA will remain in effect (meaning this test can be used) for the duration of the COVID-19 declaration under Section 564(b)(1) of the Act, 21 U.S.C. section 360bbb-3(b)(1), unless the authorization is terminated or revoked.  Performed at [facility], [address] [place], [place] [address]      Radiology Studies:  [place] EKG SITE RITE  Result Date: 06/21/2020 If [facility] image not attached, placement could not be confirmed due to current cardiac rhythm.    Scheduled Meds:   . brimonidine  1 drop Right Eye BID  . Chlorhexidine Gluconate Cloth  6 each Topical Daily  . insulin aspart  0-5 Units Subcutaneous QHS  . insulin aspart  0-9 Units Subcutaneous TID WC  . insulin detemir  7 Units Subcutaneous QHS  . levothyroxine  88 mcg Oral Q0600  . lisinopril  5 mg Oral QHS  . simvastatin  40 mg Oral QHS  . sodium chloride flush  10-40 mL Intracatheter Q12H  . timolol  1 drop Right Eye BID  . tobramycin-dexamethasone  4 drop Both EARS BID    Continuous Infusions:   . ceFEPime (MAXIPIME) IV 2 g (06/23/20 1515)  . heparin 1,200 Units/hr (06/23/20 1521)     LOS: 5 days     Marcellus Scott, MD, [place], [facility]. [facility]    To contact the attending provider between 7A-7P or the covering  provider during after hours 7P-7A, please log into the web site [email or web address] and access using universal [place] password for that web site. If you do not have the  password, please call the hospital operator.  06/23/2020, 4:05 PM

## 2020-06-23 NOTE — Progress Notes (Signed)
Pt's BP is at 177/75 at this time. Scheduled Lisinopril given to pt. Will monitor.

## 2020-06-23 NOTE — Plan of Care (Signed)

## 2020-06-23 NOTE — Progress Notes (Signed)
ANTICOAGULATION CONSULT NOTE - Follow Up Consult  Pharmacy Consult for Heparin Indication: BL venous sinus thrombosis  No Known Allergies  Patient Measurements: Height: 5\' 11"  (180.3 cm) Weight: 73.9 kg (162 lb 14.7 oz) IBW/kg (Calculated) : 75.3 Heparin Dosing Weight: 73.9kg  Vital Signs: Temp: 97.5 F (36.4 C) (06/04 0624) Temp Source: Oral (06/04 0624) BP: 172/73 (06/04 0624) Pulse Rate: 49 (06/04 0624)  Labs: Recent Labs    06/20/20 0923 06/20/20 1950 06/21/20 0520 06/21/20 1357 06/22/20 0030 06/23/20 0714  HGB 10.1*  --  10.3*  --  9.0* 11.6*  HCT 30.3*  --  31.0*  --  27.5* 35.3*  PLT 456*  --  428*  --  397 453*  HEPARINUNFRC <0.10*   < > 0.40 0.51 0.32 0.47  CREATININE 0.73  --  0.74  --   --   --    < > = values in this interval not displayed.    Estimated Creatinine Clearance: 71.8 mL/min (by C-G formula based on SCr of 0.74 mg/dL).   Assessment: Anticoag: Heparin for BL venous sinus thrombus, d-dimer 2.56.   - Hep level 0.47 in goal. Hgb ok at 11.6, Plts 453 -Will follow-up on transitioning to Eliquis today  Goal of Therapy:  Heparin level 0.3-0.5 units/ml Monitor platelets by anticoagulation protocol: Yes   Plan:  Cont heparin 1200 units/hr Daily heparin level and CBC Per MD, change to Eliquis soon if pt stable on heparin (6/4)   12-26-1981, PharmD PGY2 ID Pharmacy Resident Phone between 7 am - 3:30 pm: Margarite Gouge  Please check AMION for all [facility] Pharmacy phone numbers After 10:00 PM, call Main Pharmacy [phone]  [phone] 06/23/2020,8:42 AM

## 2020-06-23 NOTE — Progress Notes (Signed)
F/U Assessment of RUE.  Area smaller than size marking at 1000.  No discoloration noted, soft on palpation.  Denies tenderness, stating "It feels like it is smaller than it was". Agreed per assessment.  States requires another night of observation but states "that is ok".   Dtr at bedside. This patient and family so pleasant and grateful.

## 2020-06-24 LAB — CBC
HCT: 30.3 % — ABNORMAL LOW (ref 39.0–52.0)
Hemoglobin: 9.8 g/dL — ABNORMAL LOW (ref 13.0–17.0)
MCH: 29.2 pg (ref 26.0–34.0)
MCHC: 32.3 g/dL (ref 30.0–36.0)
MCV: 90.2 fL (ref 80.0–100.0)
Platelets: 338 10*3/uL (ref 150–400)
RBC: 3.36 MIL/uL — ABNORMAL LOW (ref 4.22–5.81)
RDW: 13.4 % (ref 11.5–15.5)
WBC: 6.1 10*3/uL (ref 4.0–10.5)
nRBC: 0 % (ref 0.0–0.2)

## 2020-06-24 LAB — HEPARIN LEVEL (UNFRACTIONATED): Heparin Unfractionated: 0.24 IU/mL — ABNORMAL LOW (ref 0.30–0.70)

## 2020-06-24 LAB — GLUCOSE, CAPILLARY
Glucose-Capillary: 124 mg/dL — ABNORMAL HIGH (ref 70–99)
Glucose-Capillary: 178 mg/dL — ABNORMAL HIGH (ref 70–99)

## 2020-06-24 MED ORDER — TOBRAMYCIN-DEXAMETHASONE 0.3-0.1 % OP SUSP: 4.0000 [drp] | Freq: Two times a day (BID) | OPHTHALMIC | 0 refills | Status: AC

## 2020-06-24 MED ORDER — APIXABAN 5 MG PO TABS
10.0000 mg | ORAL_TABLET | Freq: Two times a day (BID) | ORAL | Status: DC
  Administered 2020-06-24: 10 mg via ORAL
  Filled 2020-06-24: qty 2

## 2020-06-24 MED ORDER — LISINOPRIL 10 MG PO TABS: 10.0000 mg | ORAL_TABLET | Freq: Every day | ORAL | 0 refills | Status: DC

## 2020-06-24 MED ORDER — APIXABAN 5 MG PO TABS: 5.0000 mg | ORAL_TABLET | Freq: Two times a day (BID) | ORAL | Status: DC

## 2020-06-24 MED ORDER — SODIUM CHLORIDE 0.9 % IV SOLN
2.0000 g | Freq: Three times a day (TID) | INTRAVENOUS | Status: DC
  Administered 2020-06-24: 2 g via INTRAVENOUS
  Filled 2020-06-24: qty 2

## 2020-06-24 MED ORDER — APIXABAN 5 MG PO TABS: ORAL_TABLET | ORAL | 0 refills | Status: DC

## 2020-06-24 NOTE — Progress Notes (Signed)
Pharmacy Antibiotic Note  Nicolas James is a 87 y.o. male admitted on 06/18/2020 presenting with otomastoiditis with osteomyelitis and possible abscess.  Pharmacy has been consulted for cefepime dosing.  MRI confirmed skull base osteomyelitis/mastoiditis phlegmon.  ID following, planning for at least 6-8 weeks of IV antibiotics and PICC line placement pending GOC discussions. ENT is not planning for any surgical intervention at this time. Will continue current cefepime dosing as renal function remains stable and CrCl > 60 ml/min.  Plan: Continue Cefepime 2g IV every 8 hours Monitor renal function, clinical progression and ID recs  Height: 5\' 11"  (180.3 cm) Weight: 73.9 kg (162 lb 14.7 oz) IBW/kg (Calculated) : 75.3  Temp (24hrs), Avg:98 F (36.7 C), Min:97.8 F (36.6 C), Max:98.2 F (36.8 C)  Recent Labs  Lab 06/18/20 2047 06/18/20 2147 06/19/20 0224 06/20/20 0923 06/21/20 0520 06/22/20 0030 06/23/20 0714 06/24/20 0333  WBC 7.7  --  8.9 6.2 6.5 6.1 7.6 6.1  CREATININE 0.79  --  0.70 0.73 0.74  --   --   --   LATICACIDVEN  --  1.1  --   --   --   --   --   --     Estimated Creatinine Clearance: 71.8 mL/min (by C-G formula based on SCr of 0.74 mg/dL).    No Known Allergies   Microbiology Data: 5/30 BCx: ngtd  Antimicrobials this Admission: Cefepime 5/30 >>  6/30, PharmD PGY2 ID Pharmacy Resident Phone between 7 am - 3:30 pm: Margarite Gouge  Please check AMION for all [facility] Pharmacy phone numbers After 10:00 PM, call Main Pharmacy [phone]  06/24/2020 7:18 AM

## 2020-06-24 NOTE — TOC Transition Note (Addendum)
Transition of Care [facility]) - CM/SW Discharge Note   Patient Details  Name: Nicolas James MRN: [id number] Date of Birth: 06/15/1933  Transition of Care [facility]) CM/SW Contact:  Bess Kinds, RN Phone Number: [phone] 06/24/2020, 1:56 PM   Clinical Narrative:     Patient to transition home today. [name] at [facility] notified. Pam at [employer] [facility] notified. No further TOC needs identified.   Update: DC summary faxed to Dr. Vincente Liberty office [phone].   Final next level of care: Home w Home Health Services Barriers to Discharge: No Barriers Identified   Patient Goals and CMS Choice   CMS Medicare.gov Compare Post Acute Care list provided to:: Patient Choice offered to / list presented to : Patient,Adult Children Sherrilyn Rist)  Discharge Placement                       Discharge Plan and Services   Discharge Planning Services: CM Consult            DME Arranged: N/A DME Agency: NA       HH Arranged: RN,IV Antibiotics HH Agency: [facility] Date [facility] Agency Contacted: 06/24/20 Time HH Agency Contacted: [phone] Representative spoke with at [facility] Agency: [name]/[facility] and Pam/[facility]  Social Determinants of Health (SDOH) Interventions     Readmission Risk Interventions No flowsheet data found.

## 2020-06-24 NOTE — Discharge Instructions (Addendum)
Please get your medications reviewed and adjusted by your Primary MD. ° °Please request your Primary MD to go over all Hospital Tests and Procedure/Radiological results at the follow up, please get all Hospital records sent to your Prim MD by signing hospital release before you go home. ° °If you had Pneumonia of Lung problems at the Hospital: °Please get a 2 view Chest X ray done in 6-8 weeks after hospital discharge or sooner if instructed by your Primary MD. ° °If you have Congestive Heart Failure: °Please call your Cardiologist or Primary MD anytime you have any of the following symptoms:  °1) 3 pound weight gain in 24 hours or 5 pounds in 1 week  °2) shortness of breath, with or without a dry hacking cough  °3) swelling in the hands, feet or stomach  °4) if you have to sleep on extra pillows at night in order to breathe ° °Follow cardiac low salt diet and 1.5 lit/day fluid restriction. ° °If you have diabetes °Accuchecks 4 times/day, Once in AM empty stomach and then before each meal. °Log in all results and show them to your primary doctor at your next visit. °If any glucose reading is under 80 or above 300 call your primary MD immediately. ° °If you have Seizure/Convulsions/Epilepsy: °Please do not drive, operate heavy machinery, participate in activities at heights or participate in high speed sports until you have seen by Primary MD or a Neurologist and advised to do so again. ° °If you had Gastrointestinal Bleeding: °Please ask your Primary MD to check a complete blood count within one week of discharge or at your next visit. Your endoscopic/colonoscopic biopsies that are pending at the time of discharge, will also need to followed by your Primary MD. ° °Get Medicines reviewed and adjusted. °Please take all your medications with you for your next visit with your Primary MD ° °Please request your Primary MD to go over all hospital tests and procedure/radiological results at the follow up, please ask your  Primary MD to get all Hospital records sent to his/her office. ° °If you experience worsening of your admission symptoms, develop shortness of breath, life threatening emergency, suicidal or homicidal thoughts you must seek medical attention immediately by calling 911 or calling your MD immediately  if symptoms less severe. ° °You must read complete instructions/literature along with all the possible adverse reactions/side effects for all the Medicines you take and that have been prescribed to you. Take any new Medicines after you have completely understood and accpet all the possible adverse reactions/side effects.  ° °Do not drive or operate heavy machinery when taking Pain medications.  ° °Do not take more than prescribed Pain, Sleep and Anxiety Medications ° °Special Instructions: If you have smoked or chewed Tobacco  in the last 2 yrs please stop smoking, stop any regular Alcohol  and or any Recreational drug use. ° °Wear Seat belts while driving. ° °Please note °You were cared for by a hospitalist during your hospital stay. If you have any questions about your discharge medications or the care you received while you were in the hospital after you are discharged, you can call the unit and asked to speak with the hospitalist on call if the hospitalist that took care of you is not available. Once you are discharged, your primary care physician will handle any further medical issues. Please note that NO REFILLS for any discharge medications will be authorized once you are discharged, as it is imperative that you   return to your primary care physician (or establish a relationship with a primary care physician if you do not have one) for your aftercare needs so that they can reassess your need for medications and monitor your lab values.  You can reach the hospitalist office at phone [phone] or fax [phone]   If you do not have a primary care physician, you can call [phone] for a physician  referral.  Information on my medicine - ELIQUIS (apixaban)  Why was Eliquis prescribed for you? Eliquis was prescribed to treat blood clots that may have been found in the veins of your legs (deep vein thrombosis) or in your lungs (pulmonary embolism) and to reduce the risk of them occurring again.  What do You need to know about Eliquis ? The starting dose is 10 mg (two 5 mg tablets) taken TWICE daily for the NEXT 3 DOSES, then tomorrow afternoon (6/6) the dose is reduced to ONE 5 mg tablet taken TWICE daily.  Eliquis may be taken with or without food.   Try to take the dose about the same time in the morning and in the evening. If you have difficulty swallowing the tablet whole please discuss with your pharmacist how to take the medication safely.  Take Eliquis exactly as prescribed and DO NOT stop taking Eliquis without talking to the doctor who prescribed the medication.  Stopping may increase your risk of developing a new blood clot.  Refill your prescription before you run out.  After discharge, you should have regular check-up appointments with your healthcare provider that is prescribing your Eliquis.    What do you do if you miss a dose? If a dose of ELIQUIS is not taken at the scheduled time, take it as soon as possible on the same day and twice-daily administration should be resumed. The dose should not be doubled to make up for a missed dose.  Important Safety Information A possible side effect of Eliquis is bleeding. You should call your healthcare provider right away if you experience any of the following: ? Bleeding from an injury or your nose that does not stop. ? Unusual colored urine (red or dark brown) or unusual colored stools (red or black). ? Unusual bruising for unknown reasons. ? A serious fall or if you hit your head (even if there is no bleeding).  Some medicines may interact with Eliquis and might increase your risk of bleeding or clotting while on  Eliquis. To help avoid this, consult your healthcare provider or pharmacist prior to using any new prescription or non-prescription medications, including herbals, vitamins, non-steroidal anti-inflammatory drugs (NSAIDs) and supplements.  This website has more information on Eliquis (apixaban): http://www.eliquis.com/eliquis/home

## 2020-06-24 NOTE — Progress Notes (Signed)
[facility] ([facility])  Hospital Liaison: RN note         This patient has been referred to our palliative care services in the community.  [facility] will continue to follow for any discharge planning needs and to coordinate continuation of palliative care in the outpatient setting.    If you have questions or need assistance, please call [phone] or contact the hospital Liaison listed on AMION.      Thank you for this referral.         Mary Anne Robertson, RN, CCM  [facility] Hospital Liaison   [phone] 

## 2020-06-24 NOTE — Progress Notes (Signed)
F/U assessment on RUE.  Minimal swelling noted to area proximal to current PICC>  Minimal bruising discoloration noted to area.

## 2020-06-24 NOTE — Progress Notes (Addendum)
ANTICOAGULATION CONSULT NOTE - Follow Up Consult  Pharmacy Consult for Heparin Indication: BL venous sinus thrombosis  No Known Allergies  Patient Measurements: Height: 5\' 11"  (180.3 cm) Weight: 73.9 kg (162 lb 14.7 oz) IBW/kg (Calculated) : 75.3 Heparin Dosing Weight: 73.9kg  Vital Signs: Temp: 98.2 F (36.8 C) (06/05 0549) Temp Source: Oral (06/05 0549) BP: 177/93 (06/05 0549) Pulse Rate: 59 (06/05 0549)  Labs: Recent Labs    06/22/20 0030 06/23/20 0714 06/24/20 0333  HGB 9.0* 11.6* 9.8*  HCT 27.5* 35.3* 30.3*  PLT 397 453* 338  HEPARINUNFRC 0.32 0.47 0.24*    Estimated Creatinine Clearance: 71.8 mL/min (by C-G formula based on SCr of 0.74 mg/dL).   Assessment: Anticoag: Heparin for BL venous sinus thrombus, d-dimer 2.56.     Morning heparin level of 0.24 only slightly SUBtherapeutic. Hgb ~10, Plts 338. MD notified of hematoma yesterday around RUE PICC line and plans to continue heparin at least through today to ensure no worsening. Will follow-up about possible worsening hematoma prior to transitioning to Eliquis.   Per nursing, the heparin infusion was not stopped or interrupted yesterday, and his hematoma looks better this morning. Patient may transition to Eliquis today and discharge. However, will slightly increase heparin infusion to 1250 units/hr due to the slightly subtherapeutic level.  Addendum: Patient to discharge this afternoon. Patient completed 5 days of IV heparin with therapeutic levels each day. Will complete his initial 7 days of apixaban 10mg  BID over the next 4 doses (2 days) then transition to 5mg  BID.  Goal of Therapy:  Heparin level 0.3-0.5 units/ml Monitor platelets by anticoagulation protocol: Yes   Plan:  Stop heparin Start apixaban 10mg  BID x 2 days then apixaban 5mg  BID   08/24/20, PharmD PGY2 ID Pharmacy Resident Phone between 7 am - 3:30 pm:  Please check AMION for all [facility] Pharmacy phone numbers After 10:00 PM,  call Main Pharmacy [phone]  06/24/2020,7:22 AM

## 2020-06-24 NOTE — Discharge Summary (Signed)
Physician Discharge Summary  Nicolas James KWI:[id number] DOB: Apr 03, 1933  PCP: Pcp, No  Admitted from: Home Discharged to: Home  Admit date: 06/18/2020 Discharge date: 06/24/2020  Recommendations for Outpatient Follow-up:    Follow-up Information    London Pepper, MD Follow up.   Specialty: Family Medicine Why: July 05, 2020 at 4:30 pm.  To be seen with repeat labs (CBC & BMP).  Patient will have labs drawn through home health services/[facility] ([id number]) per home antibiotic treatment protocol. Contact information: [address] [address] [place], [facility] Follow up.   Specialty: Home Health Services Contact information: [address] [place] [address] [phone]        [facility] Follow up.   Why: [facility] [place], [place] Follow up.   Specialty: Infectious Diseases Why: 07/17/20 at 8:45am. If you are not able to make this apopintment please call to reschedule.  Contact information: [address] [place] [address] [phone]        Jerrell Belfast, MD. Schedule an appointment as soon as possible for a visit in 2 week(s).   Specialty: Otolaryngology Contact information: [address] [place] [address] [phone]                Home Health:  [facility] Orders (From admission, onward)    Start     Ordered   06/22/20 [place]  At discharge       Question Answer Comment  To provide the following care/treatments RN   To provide the following care/treatments Social work      06/22/20 1539           Equipment/Devices: None    Discharge Condition: Improved and stable.   Code Status: Full Code Diet recommendation:  Discharge Diet Orders (From admission, onward)    Start     Ordered   06/24/20 0000  Diet - low sodium heart healthy        06/24/20 1333   06/24/20 0000   Diet Carb Modified        06/24/20 1333           Discharge Diagnoses:  Principal Problem:   Mastoiditis Active Problems:   Osteomyelitis of skull (HCC)   Hypertensive urgency   Anemia   Insulin dependent type 2 diabetes mellitus (HCC)   Pseudomonas aeruginosa infection   Cerebral venous sinus thrombosis   Brief Summary: 87 year old male with past medical history significant for but not limited to DM, HTN, arthritis, hypothyroidism, bilateral hip and knee replacements, having issues with bilateral ear since August 2021 (ear pain/pressure like symptoms, chronic drainage) admitted on 5/30 due to symptoms of disequilibrium/headache and vomiting.  Patient has been seen by multiple ENT doctors since August 2021 and had been on several courses of antibiotics while living in [place].  S/p right myringotomy tube placement January 2022, developed right facial weakness in February 2022 and was diagnosed with Bell's palsy.  Also underwent left myringotomy tube placement in 03/2020 for middle ear effusion.  Since he was doing poorly by himself in [place], family brought him to [place].  He started following with Dr. Wilburn Cornelia, ENT in early April.  He was given a course of Augmentin and Ciprodex drops for 2 weeks and when seen in office on 4/8, continued to have bilateral otorrhea and  right facial weakness.  Ear culture 5/11 grew Pseudomonas aeruginosa resistant to fluoroquinolones.  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  Prior used hearing aids.  Since hospital admission, evaluated by multiple consultants (ENT, ID, neurosurgery, neurology).  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  No surgical intervention or invasive procedures planned.  Palliative care consulted.  RUE PICC line placed 6/4 complicated by accidental brachial artery puncture and small hematoma, thereby held off on switching from IV heparin to  Eliquis for additional 24 hours to make sure there is no worsening of hematoma.  Transitioned to Eliquis on day of discharge.  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    ID recommended prolonged course of IV cefepime for at least 6 to 8 weeks or even longer.  After discussing with palliative care, patient/family wished to pursue PICC line placement and prolonged IV antibiotics. As per neurology follow-up, recommend that following 3 days of being stable on heparin, recommend transitioning to oral anticoagulation with Eliquis for treatment of cerebral venous sinus thrombosis.  Duration of anticoagulation undetermined at this time and will need to be based on future imaging in the outpatient setting. Will need follow-up imaging with MRV head with and without contrast in 2 to 4 months with outpatient neurology.  Neurology has placed an ambulatory referral to [facility] for outpatient follow-up. PICC line was placed in right upper arm on 6/4 which was complicated by accidental puncture of brachial artery with small hematoma so transitioning from IV heparin to Eliquis was prolonged by another day while the site was closely monitored.  No further complications, hematoma decreased and has almost resolved.  Transitioned to Eliquis on day of discharge.  ID have arranged outpatient follow-up.  ENT recommends continuing TobraDex eardrops and outpatient follow-up with them in 2 weeks.  TOC team has arranged for home health RN for IV antibiotic management.  Essential hypertension:  Uncontrolled despite increasing lisinopril from prior home dose of 2.5 mg at bedtime to 5 Mg at bedtime.  Increased to lisinopril 10 mg at bedtime  at time of discharge.  Close outpatient follow-up.  Type II DM:  In the hospital patient was treated with home dose of Levemir, and with SSI.  At time of discharge, continue prior home meds including Levemir, SSI and metformin.  Close outpatient follow-up with PCP to adjust meds as deemed necessary for better control.  A1c 06/20/2020: 8.5  Hypomagnesemia: Replaced.  Hyponatremia:?  SIADH.  Stable.  Clinically euvolemic.  Improved.  Outpatient follow-up with periodic BMP.  Anemia of chronic disease: Stable hemoglobin in the 9 to 10 g per DL range.  Diplopia, bilateral deafness, facial nerve palsy: All related to above infectious etiology and management as above.  Outpatient follow-up with neurology as noted above.  Failure to thrive/goals of care: As per multiple consultants, poor overall prognosis.  Palliative care consultation appreciated.  Patient/family wish to pursue PICC line and long-term IV antibiotics.  Outpatient palliative care referral was made.  Full code.  Body mass index is 22.72 kg/m.     Consultants:   ENT Infectious disease Neurosurgery Neurology Palliative care medicine   Discharge Instructions  Discharge Instructions    [facility] pharmacist to adjust dose for  Vancomycin, Aminoglycosides and other anti-infective therapies as requested by physician.   Complete by: As directed    [facility] to provide Cath Flo 48m   Complete by: As directed    Administer for PICC line occlusion and as ordered by physician for other access device issues.   Ambulatory referral to Neurology   Complete by: As directed    Patient needs to be seen by outpatient neurology for management of cerebral venous sinus thrombosis that occurred in the setting of ostomastoiditis and skull base osteomyelitis. Was started on Eliquis inpatient prior to discharge and will need repeat imaging with MRV Head W WO Contrast to determine anticoagulation duration.    Anaphylaxis Kit: Provided to treat any anaphylactic reaction to the medication being provided to the patient if First Dose or when requested by physician   Complete by: As directed    Epinephrine 165mml vial / amp: Administer 0.60m31m0.60ml108mubcutaneously once for moderate to severe anaphylaxis, nurse to call physician and pharmacy when reaction occurs and call 911 if needed for immediate care   Diphenhydramine 50mg57mIV vial: Administer 25-50mg 22mM PRN for first dose reaction, rash, itching, mild reaction, nurse to call physician and pharmacy when reaction occurs   Sodium Chloride 0.9% NS 500ml I50mdminister if needed for hypovolemic blood pressure drop or as ordered by physician after call to physician with anaphylactic reaction   Call MD for:  difficulty breathing, headache or visual disturbances   Complete by: As directed    Call MD for:  extreme fatigue   Complete by: As directed    Call MD for:  persistant dizziness or light-headedness   Complete by: As directed    Call MD for:  persistant nausea and vomiting   Complete by: As directed    Call MD for:  severe uncontrolled pain   Complete by: As directed    Call MD for:  temperature >100.4   Complete by: As directed    Change dressing on IV access line weekly and PRN   Complete by: As directed    Diet - low sodium heart healthy   Complete by: As directed    Diet Carb Modified   Complete by: As directed    Flush IV access with Sodium Chloride 0.9% and Heparin 10 units/ml or 100 units/ml   Complete by: As directed    Home infusion instructions - [facility]   Complete by: As directed    Instructions: Flush IV access with Sodium Chloride 0.9% and Heparin 10units/ml or 100units/ml   Change dressing on IV access line: Weekly and PRN   Instructions Cath Flo 2mg: Ad40mister for PICC Line occlusion and as ordered by physician for other access device   [facility] pharmacist to adjust dose for: Vancomycin,  Aminoglycosides and other anti-infective therapies as requested by physician   Increase activity slowly   Complete by: As directed    Method of administration may be changed at the discretion of home infusion pharmacist based upon assessment of the patient and/or caregiver's ability to self-administer the medication ordered   Complete by: As directed    No wound care   Complete by: As directed        Medication List    STOP taking these medications   aspirin 81 MG EC tablet   ciprofloxacin-dexamethasone OTIC suspension Commonly known as: CIPRODEX   ibuprofen 200 MG tablet Commonly known as: ADVIL     TAKE these medications   apixaban 5  MG Tabs tablet Commonly known as: ELIQUIS Take 2 tablets (10 mg) twice daily for 3 more doses then take 1 tablet (5 mg) twice daily   brimonidine 0.2 % ophthalmic solution Commonly known as: ALPHAGAN Place 1 drop into the right eye in the morning and at bedtime.   ceFEPime  IVPB Commonly known as: MAXIPIME Inject 2 g into the vein every 8 (eight) hours. Indication:  Base of skull osteo  First Dose: Yes Last Day of Therapy:  07/30/20 Labs - Once weekly:  CBC/D and BMP, Labs - Every other week:  ESR and CRP Method of administration: IV Push Method of administration may be changed at the discretion of home infusion pharmacist based upon assessment of the patient and/or caregiver's ability to self-administer the medication ordered.   cholecalciferol 25 MCG (1000 UNIT) tablet Commonly known as: VITAMIN D Take 1,000 Units by mouth daily.   insulin detemir 100 UNIT/ML injection Commonly known as: LEVEMIR Inject 7 Units into the skin at bedtime.   insulin regular 100 units/mL injection Commonly known as: NOVOLIN R Inject 1-10 Units into the skin See admin instructions. Per sliding scale 3 times daily with meals 70-150=0 units 151-200=1 units 201-250=2 units  251-300=3 units 301-350=4 units 351-400=6 units Greater than 400=10 units    levothyroxine 88 MCG tablet Commonly known as: SYNTHROID Take 88 mcg by mouth daily.   lisinopril 10 MG tablet Commonly known as: ZESTRIL Take 1 tablet (10 mg total) by mouth at bedtime. What changed:   medication strength  how much to take   metFORMIN 850 MG tablet Commonly known as: GLUCOPHAGE Take 850 mg by mouth daily.   Refresh Lacri-Lube Oint Place 1 application into the right eye as needed (dry eye).   simvastatin 40 MG tablet Commonly known as: ZOCOR Take 40 mg by mouth at bedtime.   timolol 0.5 % ophthalmic solution Commonly known as: BETIMOL Place 1 drop into the right eye 2 (two) times daily.   tobramycin-dexamethasone ophthalmic solution Commonly known as: TOBRADEX Place 4 drops into both ears 2 (two) times daily.   vitamin B-12 1000 MCG tablet Commonly known as: CYANOCOBALAMIN Take 1,000 mcg by mouth daily.      No Known Allergies    Procedures/Studies: MR BRAIN W WO CONTRAST  Result Date: 06/19/2020 CLINICAL DATA:  Initial evaluation for acute otitis media with skull base infection. EXAM: MRI HEAD WITHOUT AND WITH CONTRAST TECHNIQUE: Multiplanar, multiecho pulse sequences of the brain and surrounding structures were obtained without and with intravenous contrast. CONTRAST:  94m GADAVIST GADOBUTROL 1 MMOL/ML IV SOLN COMPARISON:  Recent temporal bone CT from 06/15/2020. FINDINGS: Brain: Cerebral volume within normal limits for age. Minimal scattered T2/FLAIR hyperintensity seen involving the supratentorial cerebral white matter, likely chronic microvascular ischemic disease, minimal for age. 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 No evidence for acute or subacute ischemia. No acute or chronic intracranial hemorrhage. No mass lesion, mass effect, or midline shift. No hydrocephalus. Again seen are large bilateral mastoid and middle ear effusions. Extensive abnormal infiltrative soft tissue density and thickening seen  extending medially to involve the skull base including the petroclival regions, pre clival nasopharynx, with involvement of the jugular foramina bilaterally. Associated osseous erosion better appreciated on prior temporal bone CT. Area demonstrates diffuse heterogeneous signal abnormality with fairly avid postcontrast enhancement. Associated soft tissue swelling and thickening involves the visualized external auditory canals as well, with associated narrowing of both EACs. Constellation of findings consistent with extensive bilateral  otitis media/otitis externa with associated infiltrative skull base infection. Heterogeneous marrow signal intensity within the clivus and petrous portions of both temporal bones likely reflect associated osteomyelitis. Pre clival nasopharyngeal collection again seen, measuring 2.4 x 0.9 cm (series 25, image 3). Additional probable small fluid collection seen dissecting medially from the right mastoid air cells just above the sigmoid sinus and towards the right cerebellar pontine angle cistern, measuring approximately 6 x 5 mm (series 25, image 4). There is associated enhancing phlegmonous change along the posterior aspect of the clivus (series 25, image 3). Associated smooth dural thickening and enhancement throughout the adjacent skull base and along the inferior temporal convexities bilaterally, likely reflecting early intracranial spread of infection (series 26, image 7). This is also seen on axial images as well (series 25, image 8). No parenchymal edema to suggest frank encephalitis within the brain itself. No visible evidence for spread of infection into either internal auditory canal. No other acute intracranial abnormality. Vascular: Encasement of the distal cervical ICAs and proximal internal jugular veins by the infiltrative phlegmonous change throughout the skull base again noted. Major arterial intracranial vascular flow voids are maintained without evidence for occlusion  or other complication at this time. The major dural sinuses appear grossly patent as well, with no visible evidence for dural sinus thrombosis on this non MRV exam as previously questioned. Skull and upper cervical spine: Evidence for associated osteomyelitis involving the petrous temporal bones bilaterally and clivus. Additional involvement of the left greater than right occipital condyles as well as the lateral masses of C1 and dens. Additional diffuse edema seen throughout the soft tissues of the visualized upper neck (series 23, image 1). No other visible collections. Sinuses/Orbits: Patient status post bilateral ocular lens replacement. Globes and orbital soft tissues demonstrate no other acute finding. Evidence for concomitant/associated right sphenoid sinusitis. Additional mild mucosal thickening noted elsewhere within the paranasal sinuses. Other: None. IMPRESSION: 1. Findings consistent with acute bilateral otomastoiditis with otitis externa with extensive infiltrative involvement of the skull base and upper neck, with associated skull base osteomyelitis. 2. Evidence for mild and/or early intracranial spread of infection with diffuse dural thickening and enhancement about the adjacent skull base. No MRI evidence for frank encephalitis or intracranial abscess. 3. 2.4 cm pre clival collection at the central nasopharynx, likely abscess. Additional smaller 6 mm abscess adjacent to the right sigmoid sinus as above. 4. Encasement of the distal cervical ICAs by the extensive infiltrative infection throughout the skull base without evidence for vascular occlusion or stroke at this time. No definite evidence for dural venous sinus thrombosis on this exam as previously questioned, although follow-up examination with dedicated MRV would be more sensitive for evaluation of this potential complication. Electronically Signed   By: Jeannine Boga M.D.   On: 06/19/2020 04:38   MR CERVICAL SPINE W WO  CONTRAST  Result Date: 06/19/2020 CLINICAL DATA:  Neck pain.  Skull base infection. EXAM: MRI CERVICAL SPINE WITHOUT AND WITH CONTRAST TECHNIQUE: Multiplanar and multiecho pulse sequences of the cervical spine, to include the craniocervical junction and cervicothoracic junction, were obtained without and with intravenous contrast. CONTRAST:  20m GADAVIST GADOBUTROL 1 MMOL/ML IV SOLN COMPARISON:  MRI head 06/19/2020 FINDINGS: Alignment: Normal Vertebrae: Negative for fracture. There is C1-2 arthropathy with joint effusion and synovial thickening and enhancement around the dens and anterior arch of C1 as well as posterior to the dens. There is extensive surrounding soft tissue enhancement below the skull base and anterior to C1 compatible with infection.  Bony erosion of the skull base is best seen on recent CT. Cord: Cord evaluation is limited by motion. No cord signal abnormality identified. No epidural abscess identified. Posterior Fossa, vertebral arteries, paraspinal tissues: Refer to MRI and MR venogram report from today. Briefly, there is extensive soft tissue swelling below the posterior skull base and clivus. There is dural thickening and enhancement posterior to the clivus. Bilateral mastoid effusion. Disc levels: Mild degenerative change in the cervical spine. Bilateral foraminal stenosis at C3-4, C4-5, and C5-6 due to disc and facet degeneration. IMPRESSION: Extensive soft tissue enhancement below the skull base. This extends into the C1-2 joint which is likely involved with infection and septic arthritis. Bony erosion of the skull base best seen on CT compatible with osteomyelitis. No epidural abscess in the cervical spine. Dural thickening enhancement in the posterior fossa compatible with intracranial extension of infection. Electronically Signed   By: Franchot Gallo M.D.   On: 06/19/2020 14:42   DG CHEST PORT 1 VIEW  Result Date: 06/19/2020 CLINICAL DATA:  Hypoxia. EXAM: PORTABLE CHEST 1 VIEW  COMPARISON:  No prior. FINDINGS: Mediastinum hilar structures normal. Heart size normal. Low lung volumes. No focal infiltrate. No pleural effusion or pneumothorax. Degenerative change thoracic spine. IMPRESSION: Low lung volumes.  No acute abnormality identified. Electronically Signed   By: Marcello Moores  Register   On: 06/19/2020 05:28   MR MRV HEAD W WO CONTRAST  Result Date: 06/19/2020 CLINICAL DATA:  Acute neck pain. Infection. Skull base infection on CT. Rule out venous thrombosis. EXAM: MR VENOGRAM HEAD WITHOUT AND WITH CONTRAST TECHNIQUE: Angiographic images of the intracranial venous structures were acquired using MRV technique without and with intravenous contrast. CONTRAST:  52m GADAVIST GADOBUTROL 1 MMOL/ML IV SOLN COMPARISON:  CT temporal bone 06/15/2020. MRI head with contrast 06/19/2020 FINDINGS: Superior sagittal sinus is widely patent. Straight sinus is widely patent. The transverse sinus is patent bilaterally. There is filling defect on postcontrast images in the sigmoid sinus and jugular vein bilaterally compatible with venous thrombosis bilaterally. Postcontrast imaging reveals extensive enhancement and soft tissue thickening inferior to the posterior skull base and mastoid sinus bilaterally. There are bilateral mastoid effusion. There is extensive soft tissue swelling and enhancement of the external auditory canal and pinna bilaterally without abscess. There is extensive soft tissue thickening and enhancement of the posterior nasopharynx with central nonenhancing fluid collection measuring approximately 10 x 20 mm, similar to the prior study. Probable abscess. There is dural thickening and enhancement in the posterior fossa along the clivus and around the cerebellum anteriorly compatible with intracranial spread of infection. No intracranial fluid collection or abscess is identified. There is mild hydrocephalus, unchanged. Mild dural thickening and enhancement around both cerebral hemispheres also  likely related to intracranial infection. There is mucosal edema and mucosal enhancement in the sphenoid sinus bilaterally. Remaining paranasal sinuses show mild mucosal edema. IMPRESSION: 1. There is thrombus in the sigmoid sinus and jugular vein bilaterally compatible with venous sinus thrombosis. 2. Extensive skull base infection. There is extensive soft tissue swelling and enhancement involving the posterior nasopharynx and soft tissues below the posterior skull base bilaterally. This appears centered around the mastoid sinus bilaterally which is diffusely fluid-filled and has extensive surrounding soft tissue enhancement. There is enhancement and thickening of the external auditory canal bilaterally. There is mucosal edema in the paranasal sinuses primarily in the sphenoid sinus. 3. There is dural thickening and enhancement in the posterior fossa and to lesser extent around both cerebral hemispheres likely due to intracranial  spread of infection. No intracranial abscess. 4. Findings most compatible with skull base infection which could be bacterial or fungal. 5. Nonenhancing fluid collection in the posterior nasopharynx likely due to abscess, unchanged from the recent MRI earlier today. 6. These results were called by telephone at the time of interpretation on 06/19/2020 at 2:29 pm to provider [place], who verbally acknowledged these results. Electronically Signed   By: Franchot Gallo M.D.   On: 06/19/2020 14:29   CT VENOGRAM HEAD  Result Date: 06/20/2020 CLINICAL DATA:  Dural venous sinus thrombosis suspected.  Infection. EXAM: CT VENOGRAM HEAD TECHNIQUE: Venographic phase images of the head were obtained following the administration of intravenous contrast agent. A noncontrast head CT was performed prior to the administration of IV contrast. CONTRAST:  76m OMNIPAQUE IOHEXOL 350 MG/ML SOLN COMPARISON:  None. FINDINGS: CT HEAD FINDINGS Brain: There is no mass, hemorrhage or extra-axial collection. There is  generalized atrophy without lobar predilection. Hypodensity of the white matter is most commonly associated with chronic microvascular disease. Vascular: Atherosclerotic calcification of the vertebral and internal carotid arteries at the skull base. No abnormal hyperdensity of the major intracranial arteries or dural venous sinuses. Skull: Limited visualization of extracranial soft tissues. No skull fracture. Sinuses/Orbits: Opacification of the sphenoid sinuses and mastoid air cells. Bilateral middle ear opacification. The orbits are normal. CT VENOGRAM FINDINGS There is relatively poor enhancement of the dural venous sinuses on this study. Superior sagittal sinus, straight sinus, transverse sinuses, internal cerebral veins and vein of [place] are patent. The right sigmoid sinus is expanded with some central hypoattenuation which likely indicates thrombus. Assessment of the left sigmoid sinus is more limited. IMPRESSION: 1. Poor enhancement of the dural venous sinuses on this study, with suspected thrombus within the right sigmoid sinus. Assessment of the left sigmoid sinus is more limited. 2. Opacification of the sphenoid sinuses and mastoid air cells and bilateral middle ear cavities. 3. Extensive soft tissue infection at the skull base is incompletely visualized and better characterized on earlier MRI. Electronically Signed   By: KUlyses JarredM.D.   On: 06/20/2020 03:55   [place]KG SITE RITE  Result Date: 06/21/2020 If Site Rite image not attached, placement could not be confirmed due to current cardiac rhythm.  CT TEMPORAL BONES W CONTRAST  Addendum Date: 06/16/2020   ADDENDUM REPORT: 06/16/2020 12:56 ADDENDUM: Additionally, there is suspected intracranial extension of phlegmon with soft tissue noted along the inferior petrous temporal bones bilaterally and possibly the dorsal aspect of the clivus. Findings in the report and this addendum discussed with Skotnicki via telephone at 12:44 p.m. Electronically  Signed   By: FMargaretha SheffieldMD   On: 06/16/2020 12:56   Result Date: 06/16/2020 CLINICAL DATA:  Bilateral hearing loss and drainage from the ear infection for several months. Multiple rounds of antibiotics. Right facial paralysis. EXAM: CT TEMPORAL BONES WITH CONTRAST TECHNIQUE: Axial and coronal plane CT imaging of the petrous temporal bones was performed with thin-collimation image reconstruction following intravenous contrast administration. Multiplanar CT image reconstructions were also generated. CONTRAST:  [id number]-300 IOPAMIDOL (ISOVUE-300) INJECTION 61% COMPARISON:  No pertinent prior exam. FINDINGS: Large bilateral mastoid effusions with bilateral middle ear fluid. There are multifocal areas of bony erosion involving the temporal bones bilaterally including the jugular foramen region, sigmoid plates, the region of the stylomastoid foramen bilaterally, along the clivus, and likely the tegmen tympani bilaterally. There is extensive surrounding soft tissue thickening and fluid which extends anteriorly and inferiorly to involve bilateral posterior and  lateral nasopharynx and along the clivus with approximately 2.6 x 0.6 cm per-clival fluid collection. Abnormal soft tissue also involves bilateral carotid spaces and the jugular foramen with encasement of the internal carotid arteries at the skull base and the jugular veins with associated narrowing. The distal sigmoid sinuses bilaterally are non-opacified, concerning for thrombosis. The more proximal transverse sinuses are patent. Soft tissue thickening of bilateral external auditory canals. The ossicles appear intact bilaterally. Unremarkable appearance of the inner ear structures. No obvious intracranial parenchymal abnormality on limited assessment. On-call ENT provider paged at the time of dictation. Awaiting callback. IMPRESSION: 1. Large bilateral mastoid effusions and middle ear fluid with osseous erosive change of the temporal bones and skull base  with extensive masslike soft tissue thickening involving the posterior nasopharynx and skull base with encasement of the ICAs and jugular veins and preclival 2.6 cm fluid collection. 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 Nasopharyngeal carcinoma is possible, although the extent of abnormality is atypical. Recommend biopsy. An MRI with contrast could further characterize the extent of abnormality and better evaluate for intracranial spread of infection if clinically indicated. 2. Likely thrombosis of the distal sigmoid dural venous sinuses bilaterally with narrowing of the internal jugular veins at the skull base by phlegmon. MRI with contrast could better evaluate the dural venous sinuses as well. Electronically Signed: By: Margaretha Sheffield MD On: 06/16/2020 12:28      Subjective: No new complaints.  Eager to go home.  No pain at PICC line site.  Right upper arm hematoma has reduced in size.  Discharge Exam:  Vitals:   06/23/20 2049 06/23/20 2300 06/24/20 0549 06/24/20 1329  BP: (!) 177/75 (!) 155/69 (!) 177/93 (!) 158/69  Pulse: (!) 58 (!) 58 (!) 59 (!) 56  Resp: _0 Temp: 98 F (36.7 C)  98.2 F (36.8 C) (!) 97.4 F (36.3 C)  TempSrc: Oral  Oral Oral  SpO2: 97%  100% 100%  Weight:      Height:        General exam: Elderly male, moderately built and nourished, lying comfortably propped up in bed. Respiratory system: Clear to auscultation.  No increased work of breathing. Cardiovascular system: S1 and S2 heard, regular bradycardia.  No murmurs or pedal edema. Gastrointestinal system: Abdomen is nondistended, soft and nontender. No organomegaly or masses felt. Normal bowel sounds heard. Central nervous system: Alert and oriented.  Diminished right nasolabial fold.  Bilateral deafness.  Did not evaluate EOM movements.   Extremities:  Symmetric 5 x 5 power.  Right upper arm PICC line in place and a small hematoma superomedial to it has almost resolved.  Non pulsatile. Skin: No rashes, lesions or ulcers Psychiatry: Judgement and insight appear intact. Mood & affect appropriate.    The results of significant diagnostics from this hospitalization (including imaging, microbiology, ancillary and laboratory) are listed below for reference.     Microbiology: Recent Results (from the past 240 hour(s))  Blood culture (routine x 2)     Status: None   Collection Time: 06/18/20  8:45 PM   Specimen: BLOOD RIGHT ARM  Result Value Ref Range Status   Specimen Description BLOOD RIGHT ARM  Final   Special Requests   Final    BOTTLES DRAWN AEROBIC AND ANAEROBIC Blood Culture results may not be optimal due to an excessive volume of blood received in culture bottles  Culture   Final    NO GROWTH 5 DAYS Performed at [facility], [place] [address], [place], [place] [address]    Report Status 06/23/2020 FINAL  Final  Blood culture (routine x 2)     Status: None   Collection Time: 06/18/20  8:53 PM   Specimen: BLOOD LEFT ARM  Result Value Ref Range Status   Specimen Description BLOOD LEFT ARM  Final   Special Requests   Final    BOTTLES DRAWN AEROBIC ONLY Blood Culture results may not be optimal due to an excessive volume of blood received in culture bottles   Culture   Final    NO GROWTH 5 DAYS Performed at [facility], [place] [address] [place], [place] [address]    Report Status 06/23/2020 FINAL  Final  Resp Panel by RT-PCR (Flu A&B, Covid) Nasopharyngeal Swab     Status: None   Collection Time: 06/18/20 10:49 PM   Specimen: Nasopharyngeal Swab; Nasopharyngeal(NP) swabs in vial transport medium  Result Value Ref Range Status   SARS Coronavirus 2 by RT PCR NEGATIVE NEGATIVE Final    Comment: (NOTE) SARS-CoV-2 target nucleic acids are NOT DETECTED.  The SARS-CoV-2 RNA is generally detectable in upper  respiratory specimens during the acute phase of infection. The lowest concentration of SARS-CoV-2 viral copies this assay can detect is 138 copies/mL. A negative result does not preclude SARS-Cov-2 infection and should not be used as the sole basis for treatment or other patient management decisions. A negative result may occur with  improper specimen collection/handling, submission of specimen other than nasopharyngeal swab, presence of viral mutation(s) within the areas targeted by this assay, and inadequate number of viral copies(<138 copies/mL). A negative result must be combined with clinical observations, patient history, and epidemiological information. The expected result is Negative.  Fact Sheet for Patients:  EntrepreneurPulse.com.au  Fact Sheet for Healthcare Providers:  IncredibleEmployment.be  This test is no t yet approved or cleared by the [place] FDA and  has been authorized for detection and/or diagnosis of SARS-CoV-2 by FDA under an Emergency Use Authorization (EUA). This EUA will remain  in effect (meaning this test can be used) for the duration of the COVID-19 declaration under Section 564(b)(1) of the Act, 21 U.S.C.section 360bbb-3(b)(1), unless the authorization is terminated  or revoked sooner.       Influenza A by PCR NEGATIVE NEGATIVE Final   Influenza B by PCR NEGATIVE NEGATIVE Final    Comment: (NOTE) The Xpert Xpress SARS-CoV-2/FLU/RSV plus assay is intended as an aid in the diagnosis of influenza from Nasopharyngeal swab specimens and should not be used as a sole basis for treatment. Nasal washings and aspirates are unacceptable for Xpert Xpress SARS-CoV-2/FLU/RSV testing.  Fact Sheet for Patients: EntrepreneurPulse.com.au  Fact Sheet for Healthcare Providers: IncredibleEmployment.be  This test is not yet approved or cleared by the [place] FDA and has been  authorized for detection and/or diagnosis of SARS-CoV-2 by FDA under an Emergency Use Authorization (EUA). This EUA will remain in effect (meaning this test can be used) for the duration of the COVID-19 declaration under Section 564(b)(1) of the Act, 21 U.S.C. section 360bbb-3(b)(1), unless the authorization is terminated or revoked.  Performed at [facility], [place] [address], [place], [place] [address]      Labs: CBC: Recent Labs  Lab 06/18/20 2047 06/19/20 0224 06/20/20 5361 06/21/20 0520 06/22/20 0030 06/23/20 0714 06/24/20 0333  WBC 7.7   < > 6.2 6.5  6.1 7.6 6.1  NEUTROABS 6.0  --   --   --   --   --   --   HGB 10.4*   < > 10.1* 10.3* 9.0* 11.6* 9.8*  HCT 31.4*   < > 30.3* 31.0* 27.5* 35.3* 30.3*  MCV 90.2   < > 88.6 88.6 89.9 90.1 90.2  PLT 484*   < > 456* 428* 397 453* 338   < > = values in this interval not displayed.    Basic Metabolic Panel: Recent Labs  Lab 06/18/20 2047 06/19/20 0224 06/19/20 0929 06/20/20 0923 06/21/20 0520  NA 129* 128*  --  129* 132*  K 4.2 3.9  --  3.5 3.8  CL 91* 91*  --  90* 97*  CO2 31 25  --  31 28  GLUCOSE 204* 223*  --  94 123*  BUN 19 17  --  14 11  CREATININE 0.79 0.70  --  0.73 0.74  CALCIUM 8.5* 8.4*  --  8.1* 8.3*  MG  --   --  1.4* 2.0  --     Liver Function Tests: Recent Labs  Lab 06/18/20 2047 06/20/20 0923  AST 16 15  ALT 14 12  ALKPHOS 75 61  BILITOT 0.6 0.4  PROT 5.8* 5.0*  ALBUMIN 2.3* 2.0*    CBG: Recent Labs  Lab 06/23/20 1209 06/23/20 1706 06/23/20 2048 06/24/20 0816 06/24/20 1203  GLUCAP 203* 302* 293* 124* 178*    I discussed in detail with patient and his daughter at bedside, updated care and answered all questions.  Time coordinating discharge: 50 minutes  SIGNED:  Vernell Leep, MD, [place], [facility]. [facility]  To contact the attending provider between 7A-7P or the covering provider during after hours 7P-7A, please log into the web site [email or web address] and access using  universal [place] password for that web site. If you do not have the password, please call the hospital operator.

## 2020-06-24 NOTE — Progress Notes (Signed)
Nicolas James to be D/C'd per MD order. Discussed with the patient and all questions fully answered. ? VSS, Skin clean, dry and intact without evidence of skin break down, no evidence of skin tears noted. ? IV catheter discontinued intact. Site without signs and symptoms of complications. Dressing and pressure applied. PICC to remain in place for IV antibiotics at home. ? An After Visit Summary was printed and given to the patient. Patient informed where to pickup prescriptions. ? D/C education completed with patient/family including follow up instructions, medication list, d/c activities limitations if indicated, with other d/c instructions as indicated by MD - patient able to verbalize understanding, all questions fully answered.  ? Patient instructed to return to ED, call 911, or call MD for any changes in condition.  ? Patient to be escorted via WC, and D/C home via private auto.

## 2020-06-29 ENCOUNTER — Ambulatory Visit (INDEPENDENT_AMBULATORY_CARE_PROVIDER_SITE_OTHER): Payer: Medicare Other | Admitting: Diagnostic Neuroimaging

## 2020-06-29 ENCOUNTER — Encounter: Payer: Self-pay | Admitting: Diagnostic Neuroimaging

## 2020-06-29 VITALS — BP 141/66 | HR 59 | Ht 69.0 in | Wt 166.0 lb

## 2020-06-29 DIAGNOSIS — G08 Intracranial and intraspinal phlebitis and thrombophlebitis: Secondary | ICD-10-CM | POA: Diagnosis not present

## 2020-06-29 DIAGNOSIS — M869 Osteomyelitis, unspecified: Secondary | ICD-10-CM

## 2020-06-29 MED ORDER — APIXABAN 5 MG PO TABS: 5.0000 mg | ORAL_TABLET | Freq: Two times a day (BID) | ORAL | 4 refills | Status: DC

## 2020-06-29 NOTE — Progress Notes (Signed)
[facility]  PATIENT: Nicolas James DOB: [birth year]  REFERRING CLINICIAN: Einar Pheasant, NP HISTORY FROM: patient  REASON FOR VISIT: new consult    HISTORICAL  CHIEF COMPLAINT:  Chief Complaint  Patient presents with   New Patient (Initial Visit)    RM 7, with daughter. F/u on stroke. Has not gotten hearing back. Complaining of neck pain. PICC in R arm.    HISTORY OF PRESENT ILLNESS:   87 year old male here for evaluation of cerebral venous sinus thrombosis.  Patient has history of diabetes and hypertension, arthritis, previously living in [place].  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  He was having problems with recurrent ear infections since August 2021 treated with antibiotics.  He underwent right myringotomy tube placement in January 2022.  In February 2022 he developed right facial weakness.  Patient was developing 40 pound weight loss malaise and weakness.  His family brought him to [facility] for better care and attention.  Patient had progressive hearing loss and otorrhea.  Ultimately he had imaging studies which raise possibility of otomastoiditis, osteomyelitis, abscess and venous sinus thrombosis.  He was admitted to the hospital on 06/18/2020 for evaluation.  He was treated with IV antibiotics and anticoagulation.  Patient was discharged on 06/24/2020 with IV antibiotics via PICC line and Eliquis anticoagulation.  Patient still complains of headache, neck pain, hearing loss issues.    REVIEW OF SYSTEMS: Full 14 system review of systems performed and negative with exception of: as per HPI.   ALLERGIES: No Known Allergies  HOME MEDICATIONS: Outpatient Medications Prior to Visit  Medication Sig Dispense Refill   apixaban (ELIQUIS) 5 MG TABS tablet Take 2 tablets (10 mg) twice daily for 3 more doses then take 1 tablet (5 mg) twice daily 60 tablet 0   brimonidine (ALPHAGAN) 0.2 %  ophthalmic solution Place 1 drop into the right eye in the morning and at bedtime.     ceFEPime (MAXIPIME) IVPB Inject 2 g into the vein every 8 (eight) hours. Indication:  Base of skull osteo  First Dose: Yes Last Day of Therapy:  07/30/20 Labs - Once weekly:  CBC/D and BMP, Labs - Every other week:  ESR and CRP Method of administration: IV Push Method of administration may be changed at the discretion of home infusion pharmacist based upon assessment of the patient and/or caregiver's ability to self-administer the medication ordered. 114 Units 0   cholecalciferol (VITAMIN D) 25 MCG (1000 UNIT) tablet Take 1,000 Units by mouth daily.     insulin detemir (LEVEMIR) 100 UNIT/ML injection Inject 7 Units into the skin at bedtime.     insulin regular (NOVOLIN R) 100 units/mL injection Inject 1-10 Units into the skin See admin instructions. Per sliding scale 3 times daily with meals 70-150=0 units 151-200=1 units 201-250=2 units  251-300=3 units 301-350=4 units 351-400=6 units Greater than 400=10 units     levothyroxine (SYNTHROID) 88 MCG tablet Take 88 mcg by mouth daily.     lisinopril (ZESTRIL) 10 MG tablet Take 1 tablet (10 mg total) by mouth at bedtime. 30 tablet 0   metFORMIN (GLUCOPHAGE) 850 MG tablet Take 850 mg by mouth daily.     simvastatin (ZOCOR) 40 MG tablet Take 40 mg by mouth at bedtime.     timolol (BETIMOL) 0.5 % ophthalmic solution Place 1 drop into the right eye 2 (two) times daily.     tobramycin-dexamethasone (TOBRADEX) ophthalmic solution  Place 4 drops into both ears 2 (two) times daily. 5 mL 0   vitamin B-12 (CYANOCOBALAMIN) 1000 MCG tablet Take 1,000 mcg by mouth daily.     White Petrolatum-Mineral Oil (REFRESH LACRI-LUBE) OINT Place 1 application into the right eye as needed (dry eye).     No facility-administered medications prior to visit.    PAST MEDICAL HISTORY: Past Medical History:  Diagnosis Date   Diabetes mellitus without complication ([place])    Hypertension     Thyroid disease     PAST SURGICAL HISTORY: Past Surgical History:  Procedure Laterality Date   JOINT REPLACEMENT Bilateral    Hips and Knees    FAMILY HISTORY: No family history on file.  SOCIAL HISTORY: Social History   Socioeconomic History   Marital status: Widowed    Spouse name: Not on file   Number of children: Not on file   Years of education: Not on file   Highest education level: Not on file  Occupational History   Not on file  Tobacco Use   Smoking status: Never   Smokeless tobacco: Never  Substance and Sexual Activity   Alcohol use: Never   Drug use: Never   Sexual activity: Not on file  Other Topics Concern   Not on file  Social History Narrative   Not on file   Social Determinants of Health   Financial Resource Strain: Not on file  Food Insecurity: Not on file  Transportation Needs: Not on file  Physical Activity: Not on file  Stress: Not on file  Social Connections: Not on file  Intimate Partner Violence: Not on file     PHYSICAL EXAM  GENERAL EXAM/CONSTITUTIONAL: Vitals:  Vitals:   06/29/20 0841  BP: (!) 141/66  Pulse: (!) 59  Weight: 166 lb (75.3 kg)  Height: 5' 9" (1.753 m)   Body mass index is 24.51 kg/m. Wt Readings from Last 3 Encounters:  06/29/20 166 lb (75.3 kg)  06/19/20 162 lb 14.7 oz (73.9 kg)   Patient is in no distress; well developed, nourished and groomed; neck is supple  CARDIOVASCULAR: Examination of carotid arteries is normal; no carotid bruits Regular rate and rhythm, no murmurs Examination of peripheral vascular system by observation and palpation is normal  EYES: Ophthalmoscopic exam of optic discs and posterior segments is normal; no papilledema or hemorrhages No results found.  MUSCULOSKELETAL: Gait, strength, tone, movements noted in Neurologic exam below  NEUROLOGIC: MENTAL STATUS:  No flowsheet data found. awake, alert, oriented to person, place and time recent and remote memory intact normal  attention and concentration language fluent, comprehension intact, naming intact fund of knowledge appropriate  CRANIAL NERVE:  2nd - no papilledema on fundoscopic exam 2nd, 3rd, 4th, 6th - pupils equal and reactive to light, visual fields full to confrontation, extraocular muscles intact, no nystagmus 5th - facial sensation symmetric 7th - facial strength --> RIGHT UPPER AND LOWER FACIAL WEAKNESS 8th - hearing intact 9th - palate elevates symmetrically, uvula midline 11th - shoulder shrug symmetric 12th - tongue protrusion midline  MOTOR:  normal bulk and tone, full strength in the BUE, BLE  SENSORY:  normal and symmetric to light touch, temperature, vibration  COORDINATION:  finger-nose-finger, fine finger movements normal  REFLEXES:  deep tendon reflexes trace and symmetric  GAIT/STATION:  WEAK APPEARING; USING WALKER     DIAGNOSTIC DATA (LABS, IMAGING, TESTING) - I reviewed patient records, labs, notes, testing and imaging myself where available.  Lab Results  Component Value Date  WBC 6.1 06/24/2020   HGB 9.8 (L) 06/24/2020   HCT 30.3 (L) 06/24/2020   MCV 90.2 06/24/2020   PLT 338 06/24/2020      Component Value Date/Time   NA 132 (L) 06/21/2020 0520   K 3.8 06/21/2020 0520   CL 97 (L) 06/21/2020 0520   CO2 28 06/21/2020 0520   GLUCOSE 123 (H) 06/21/2020 0520   BUN 11 06/21/2020 0520   CREATININE 0.74 06/21/2020 0520   CALCIUM 8.3 (L) 06/21/2020 0520   PROT 5.0 (L) 06/20/2020 0923   ALBUMIN 2.0 (L) 06/20/2020 0923   AST 15 06/20/2020 0923   ALT 12 06/20/2020 0923   ALKPHOS 61 06/20/2020 0923   BILITOT 0.4 06/20/2020 0923   GFRNONAA >60 06/21/2020 0520   No results found for: CHOL, HDL, LDLCALC, LDLDIRECT, TRIG, CHOLHDL Lab Results  Component Value Date   HGBA1C 8.5 (H) 06/20/2020   Lab Results  Component Value Date   [id number] 546 06/19/2020   No results found for: TSH   06/15/20 CT temporal bone 1. Large bilateral mastoid effusions and  middle ear fluid with osseous erosive change of the temporal bones and skull base with extensive masslike soft tissue thickening involving the posterior nasopharynx and skull base with encasement of the ICAs and jugular veins and preclival 2.6 cm fluid collection. 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 Nasopharyngeal carcinoma is possible, although the extent of abnormality is atypical. Recommend biopsy. An MRI with contrast could further characterize the extent of abnormality and better evaluate for intracranial spread of infection if clinically indicated. 2. Likely thrombosis of the distal sigmoid dural venous sinuses bilaterally with narrowing of the internal jugular veins at the skull base by phlegmon. MRI with contrast could better evaluate the dural venous sinuses as well.  06/19/20 MRI brain [I reviewed images myself and agree with interpretation. -VRP]  1. Findings consistent with acute bilateral otomastoiditis with otitis externa with extensive infiltrative involvement of the skull base and upper neck, with associated skull base osteomyelitis. 2. Evidence for mild and/or early intracranial spread of infection with diffuse dural thickening and enhancement about the adjacent skull base. No MRI evidence for frank encephalitis or intracranial abscess. 3. 2.4 cm pre clival collection at the central nasopharynx, likely abscess. Additional smaller 6 mm abscess adjacent to the right sigmoid sinus as above. 4. Encasement of the distal cervical ICAs by the extensive infiltrative infection throughout the skull base without evidence for vascular occlusion or stroke at this time. No definite evidence for dural venous sinus thrombosis on this exam as previously questioned, although follow-up examination with dedicated MRV would be more sensitive  for evaluation of this potential complication.  06/19/20 MRV head 1. There is thrombus in the sigmoid sinus and jugular vein bilaterally compatible with venous sinus thrombosis. 2. Extensive skull base infection. There is extensive soft tissue swelling and enhancement involving the posterior nasopharynx and soft tissues below the posterior skull base bilaterally. This appears centered around the mastoid sinus bilaterally which is diffusely fluid-filled and has extensive surrounding soft tissue enhancement. There is enhancement and thickening of the external auditory canal bilaterally. There is mucosal edema in the paranasal sinuses primarily in the sphenoid sinus. 3. There is dural thickening and enhancement in the posterior fossa and to lesser extent around both cerebral hemispheres likely due to intracranial spread of infection. No intracranial abscess. 4. Findings most compatible with skull base infection  which could be bacterial or fungal. 5. Nonenhancing fluid collection in the posterior nasopharynx likely due to abscess, unchanged from the recent MRI earlier today. 6. These results were called by telephone at the time of interpretation on 06/19/2020 at 2:29 pm to provider [place], who verbally acknowledged these results.  06/20/20 CTV head  1. Poor enhancement of the dural venous sinuses on this study, with suspected thrombus within the right sigmoid sinus. Assessment of the left sigmoid sinus is more limited. 2. Opacification of the sphenoid sinuses and mastoid air cells and bilateral middle ear cavities. 3. Extensive soft tissue infection at the skull base is incompletely visualized and better characterized on earlier MRI.    ASSESSMENT AND PLAN  87 y.o. year old male here with recurrent ear infections, mastoiditis, resulting in osteomyelitis, right facial nerve palsy, and bilateral sigmoid and jugular vein thrombosis.  Also with generalized malaise, weight loss and  weakness.  Dx:  1. Cerebral venous sinus thrombosis   2. 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    Dispense:  180 tablet    Refill:  4   Return in about 3 months (around 09/29/2020).    Penni Bombard, MD 05/06/3843, 3:64 AM Certified in Neurology, Neurophysiology and Neuroimaging  [facility] [address] [place], [place] [id number] [phone]

## 2020-06-29 NOTE — Patient Instructions (Signed)
-   plan for ~3-6 months anticoagulation - complete IV abx (cefipine)

## 2020-07-05 ENCOUNTER — Telehealth: Payer: Self-pay | Admitting: Diagnostic Neuroimaging

## 2020-07-05 NOTE — Telephone Encounter (Signed)
[facility] Angelique Blonder) called, verify physician approve [facility] providing palliative care. Would like a call from the nurse.   Contact info: [phone], option 2

## 2020-07-05 NOTE — Telephone Encounter (Signed)
Returned call and spoke to [place].  She is going to call family and see who patient uses for PCP and follow up through them for palliative care orders management.  Palliative care was ordered at discharge from hospital.  Patient denied further questions, verbalized understanding and expressed appreciation for the phone call.

## 2020-07-10 ENCOUNTER — Telehealth: Payer: Self-pay | Admitting: Diagnostic Neuroimaging

## 2020-07-10 NOTE — Telephone Encounter (Signed)
Nicolas James from [facility] left a voicemail stating does Dr. Marjory Lies think pt needs Palliative care waiting for pt to get established with a PCP asked to be called on phone number [phone] option 2

## 2020-07-11 NOTE — Telephone Encounter (Signed)
[facility] Kennyth Arnold) called, returning phone call, would like a call from the nurse.

## 2020-07-11 NOTE — Telephone Encounter (Signed)
LVM for Nicolas James with [facility] to return call.

## 2020-07-12 NOTE — Telephone Encounter (Signed)
Patient has a PCP appointment coming soon ( Dr. Kateri Plummer) and will have him sign for orders.   Patient denied further questions, verbalized understanding and expressed appreciation for the phone call.

## 2020-07-17 ENCOUNTER — Ambulatory Visit (INDEPENDENT_AMBULATORY_CARE_PROVIDER_SITE_OTHER): Payer: Medicare Other | Admitting: Infectious Diseases

## 2020-07-17 ENCOUNTER — Telehealth: Payer: Self-pay

## 2020-07-17 ENCOUNTER — Other Ambulatory Visit: Payer: Self-pay

## 2020-07-17 DIAGNOSIS — M542 Cervicalgia: Secondary | ICD-10-CM

## 2020-07-17 DIAGNOSIS — Z5181 Encounter for therapeutic drug level monitoring: Secondary | ICD-10-CM | POA: Diagnosis not present

## 2020-07-17 DIAGNOSIS — G060 Intracranial abscess and granuloma: Secondary | ICD-10-CM

## 2020-07-17 DIAGNOSIS — M869 Osteomyelitis, unspecified: Secondary | ICD-10-CM | POA: Diagnosis not present

## 2020-07-17 DIAGNOSIS — H7093 Unspecified mastoiditis, bilateral: Secondary | ICD-10-CM | POA: Insufficient documentation

## 2020-07-17 NOTE — Telephone Encounter (Signed)
Tiffany spoke with Larita Fife with [facility] and verbal orders given to end IV antibiotics on 08/14/20 and remove picc line after last dose per Dr. Elinor Parkinson. Larita Fife verbalized understanding. Tykeem Lanzer T Pricilla Loveless

## 2020-07-17 NOTE — Progress Notes (Signed)
[facility]                                                             [place], [place], [place], [address]                                                                  Phn. 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 R/O MALIGNANCY ( Patient did not have biopsy of the performed)  Bilateral sigmoid and jugular vein thrombosis  Medication Monitoring - 6/15 Cr 0.79, ESR 29, CRP 10  Bilateral Hip and Knee replacements DM   Plan Continue cefepime, tentative end date for 8 weeks is 08/14/20. However, Continue IV antibiotics until seen by provider next visit  Follow up with Neurology for management of anticoagulation for Cerebral venous sinus thrombosis Follow up with ENT as instructed Follow up with PCP for screening of age based malignancy.  I will follow up in a month and plan for MRI brain and C spine around that time   All questions and concerns were discussed and addressed. Patient verbalized understanding of the plan. ____________________________________________________________________________________________________________________ Interval Events  07/17/20 Here for HFU for bilateral mastoiditis/skull base osteomyelitis. Patient is accompanied by her daughter with whom he is currently staying. Daughter is helping with history. She tells me her father's hearing has not improved at all and he needs to write in order to communicate. He also has watering from his Rt eye and nose occasionally. Appetite is good since being discharged from the hospital and he actually has gained sone weight. He has neck pain is 2/10, motion is restricted at the end of flexion/extension and sidewise movement. Denies any headache, ear pain/discharge. He followed up with ENT and Neurology post hospital discharge. Denies any pain/tenderness/swelling at the PICC line site. Denies fevers, chills, nausea and vomiting and diarrhea. He is getting IV cefepime without any issues.  Discussed with daughter about following up with ENT and neurology as instructed and also following up with PCP for age related malignancy screening.   ROS: as above per daughter given patient has severe hearing loss.   Past Medical History:  Diagnosis Date   Diabetes mellitus without complication ([place])    Hypertension    Thyroid disease    Past Surgical History:  Procedure Laterality Date   JOINT REPLACEMENT Bilateral    Hips and Knees  ' Current Outpatient Medications on File Prior to Visit  Medication Sig Dispense Refill   apixaban (ELIQUIS) 5 MG TABS tablet Take 1 tablet (5 mg total) by mouth 2 (two) times daily. 180 tablet 4   brimonidine (ALPHAGAN) 0.2 % ophthalmic solution Place 1 drop into the right eye in the morning and at bedtime.     ceFEPime (MAXIPIME) IVPB Inject 2 g into the vein every 8 (eight) hours. Indication:  Base of skull osteo  First Dose: Yes Last Day of Therapy:  07/30/20 Labs - Once weekly:  CBC/D and BMP, Labs - Every other week:  ESR and CRP Method of administration: IV Push Method of administration may be changed at the discretion of home infusion pharmacist based  upon assessment of the patient and/or caregiver's ability to self-administer the medication ordered. 114 Units 0   cholecalciferol (VITAMIN D) 25 MCG (1000 UNIT) tablet Take 1,000 Units by mouth daily.     insulin detemir (LEVEMIR) 100 UNIT/ML injection Inject 7 Units into the skin at bedtime.     insulin regular (NOVOLIN R) 100 units/mL injection Inject 1-10 Units into the skin See admin instructions. Per sliding scale 3 times daily with meals 70-150=0 units 151-200=1 units 201-250=2 units  251-300=3 units 301-350=4 units 351-400=6 units Greater than 400=10 units     levothyroxine (SYNTHROID) 88 MCG tablet Take 88 mcg by mouth daily.     lisinopril (ZESTRIL) 10 MG tablet Take 1 tablet (10 mg total) by mouth at bedtime. 30 tablet 0   metFORMIN (GLUCOPHAGE) 850 MG tablet Take 850 mg by mouth daily.     simvastatin (ZOCOR) 40 MG tablet Take 40 mg by mouth at bedtime.     timolol (BETIMOL) 0.5 % ophthalmic solution Place 1 drop into the right eye 2 (two) times daily.     tobramycin-dexamethasone (TOBRADEX) ophthalmic solution Place 4 drops into both ears 2 (two) times daily. 5 mL 0   vitamin B-12 (CYANOCOBALAMIN) 1000 MCG tablet Take 1,000 mcg by mouth daily.     White Petrolatum-Mineral Oil (REFRESH LACRI-LUBE) OINT Place 1 application into the right eye as needed (dry eye).     No current facility-administered medications on file prior to visit.   No Known Allergies  Social History   Socioeconomic History   Marital status: Widowed    Spouse name: Not on file   Number of children: Not on file   Years of education: Not on file   Highest education level: Not on file  Occupational History   Not on file  Tobacco Use   Smoking status: Never   Smokeless tobacco: Never  Substance and Sexual Activity   Alcohol use: Never   Drug use: Never   Sexual activity: Not on file  Other Topics Concern   Not on file  Social History Narrative   Not on file   Social Determinants of Health    Financial Resource Strain: Not on file  Food Insecurity: Not on file  Transportation Needs: Not on file  Physical  Activity: Not on file  Stress: Not on file  Social Connections: Not on file  Intimate Partner Violence: Not on file    Vitals There were no vitals taken for this visit.  Examination  General - not in acute distress, comfortably sitting in chair HEENT -  Tearing in rt eye,  Severe hearing loss  Flattening of rt nasolabial fold, asymmetric smile Limited flexion and extension/sidewise movement of neck at the extremes  No c spine tenderness Power 5/5 in all extremities   Chest - b/l clear air entry, no additional sounds CVS- Normal s1s2, RRR Abdomen - Soft, Non tender , non distended Ext- no pedal edema Neuro: grossly normal Psych : calm and cooperative   Recent labs CBC Latest Ref Rng & Units 06/24/2020 06/23/2020 06/22/2020  WBC 4.0 - 10.5 K/uL 6.1 7.6 6.1  Hemoglobin 13.0 - 17.0 g/dL 9.8(L) 11.6(L) 9.0(L)  Hematocrit 39.0 - 52.0 % 30.3(L) 35.3(L) 27.5(L)  Platelets 150 - 400 K/uL 338 453(H) 397   CMP Latest Ref Rng & Units 06/21/2020 06/20/2020 06/19/2020  Glucose 70 - 99 mg/dL 123(H) 94 223(H)  BUN 8 - 23 mg/dL _0 Creatinine 0.61 - 1.24 mg/dL 0.74 0.73 0.70  Sodium 135 - 145 mmol/L 132(L) 129(L) 128(L)  Potassium 3.5 - 5.1 mmol/L 3.8 3.5 3.9  Chloride 98 - 111 mmol/L 97(L) 90(L) 91(L)  CO2 22 - 32 mmol/L _1 Calcium 8.9 - 10.3 mg/dL 8.3(L) 8.1(L) 8.4(L)  Total Protein 6.5 - 8.1 g/dL - 5.0(L) -  Total Bilirubin 0.3 - 1.2 mg/dL - 0.4 -  Alkaline Phos 38 - 126 U/L - 61 -  AST 15 - 41 U/L - 15 -  ALT 0 - 44 U/L - 12 -     Pertinent Microbiology Results for orders placed or performed during the hospital encounter of 06/18/20  Blood culture (routine x 2)     Status: None   Collection Time: 06/18/20  8:45 PM   Specimen: BLOOD RIGHT ARM  Result Value Ref Range Status   Specimen Description BLOOD RIGHT ARM  Final   Special Requests   Final     BOTTLES DRAWN AEROBIC AND ANAEROBIC Blood Culture results may not be optimal due to an excessive volume of blood received in culture bottles   Culture   Final    NO GROWTH 5 DAYS Performed at [facility], [place] [address], [place], [place] [address]    Report Status 06/23/2020 FINAL  Final  Blood culture (routine x 2)     Status: None   Collection Time: 06/18/20  8:53 PM   Specimen: BLOOD LEFT ARM  Result Value Ref Range Status   Specimen Description BLOOD LEFT ARM  Final   Special Requests   Final    BOTTLES DRAWN AEROBIC ONLY Blood Culture results may not be optimal due to an excessive volume of blood received in culture bottles   Culture   Final    NO GROWTH 5 DAYS Performed at [facility], [place] [address], [place], [place] [address]    Report Status 06/23/2020 FINAL  Final  Resp Panel by RT-PCR (Flu A&B, Covid) Nasopharyngeal Swab     Status: None   Collection Time: 06/18/20 10:49 PM   Specimen: Nasopharyngeal Swab; Nasopharyngeal(NP) swabs in vial transport medium  Result Value Ref Range Status   SARS Coronavirus 2 by RT PCR NEGATIVE NEGATIVE Final    Comment: (NOTE) SARS-CoV-2 target nucleic acids are NOT DETECTED.  The SARS-CoV-2 RNA is generally detectable in upper respiratory specimens during the acute phase of infection. The lowest concentration of SARS-CoV-2 viral copies this assay can detect is 138 copies/mL. A negative result does not preclude SARS-Cov-2 infection and should not be used as the sole basis for treatment or other patient management decisions. A negative result may occur with  improper specimen collection/handling, submission of specimen other than nasopharyngeal swab, presence of viral mutation(s) within the areas targeted by this assay, and inadequate number of viral copies(<138 copies/mL). A negative result must be combined with clinical observations, patient history, and epidemiological information. The expected result is Negative.  Fact  Sheet for Patients:  EntrepreneurPulse.com.au  Fact Sheet for Healthcare Providers:  IncredibleEmployment.be  This test is no t yet approved or cleared by the [place] FDA and  has been authorized for detection and/or diagnosis of SARS-CoV-2 by FDA under an Emergency Use Authorization (EUA). This EUA will remain  in effect (meaning this test can be used) for the duration of the COVID-19 declaration under Section 564(b)(1) of the Act, 21 U.S.C.section 360bbb-3(b)(1), unless the authorization is terminated  or revoked sooner.       Influenza A by PCR NEGATIVE NEGATIVE Final   Influenza B by PCR NEGATIVE NEGATIVE Final    Comment: (NOTE) The Xpert Xpress SARS-CoV-2/FLU/RSV plus assay is intended as an aid in the diagnosis of influenza from Nasopharyngeal swab specimens and should not be used as a sole basis for treatment. Nasal washings and aspirates are unacceptable for Xpert Xpress SARS-CoV-2/FLU/RSV testing.  Fact Sheet for Patients: EntrepreneurPulse.com.au  Fact Sheet for Healthcare Providers: IncredibleEmployment.be  This test is not yet approved or cleared by the [place] FDA and has been authorized for detection and/or diagnosis of SARS-CoV-2 by FDA under an Emergency Use Authorization (EUA). This EUA will remain in effect (meaning this test can be used) for the duration of the COVID-19 declaration under Section 564(b)(1) of the Act, 21 U.S.C. section 360bbb-3(b)(1), unless the authorization is terminated or revoked.  Performed at [facility], [place] [address] [place], [place] [address]    Pertinent Imaging All pertinent labs/Imagings/notes reviewed. All pertinent plain films and CT images have been personally visualized and interpreted; radiology reports have been reviewed. Decision making incorporated into the Impression / Recommendations.  have spent a total of 20 minutes of  face-to-face and non-face-to-face time, excluding clinical staff time, preparing to see patient, ordering tests and/or medications, and provide counseling the patient    Electronically signed by:  [name], MD Infectious Disease Physician [facility] [address] [place],  [address] Phone: [phone]  Fax: [phone]

## 2020-07-26 ENCOUNTER — Other Ambulatory Visit (HOSPITAL_COMMUNITY): Payer: Self-pay | Admitting: Orthopedic Surgery

## 2020-07-26 DIAGNOSIS — T84032A Mechanical loosening of internal right knee prosthetic joint, initial encounter: Secondary | ICD-10-CM

## 2020-07-26 DIAGNOSIS — T84033A Mechanical loosening of internal left knee prosthetic joint, initial encounter: Secondary | ICD-10-CM

## 2020-08-01 ENCOUNTER — Other Ambulatory Visit: Payer: Self-pay

## 2020-08-01 ENCOUNTER — Ambulatory Visit (HOSPITAL_COMMUNITY)
Admission: RE | Admit: 2020-08-01 | Discharge: 2020-08-01 | Disposition: A | Discharge status: Home / Self Care | Payer: Medicare Other | Source: Ambulatory Visit | Attending: Infectious Diseases | Admitting: Infectious Diseases

## 2020-08-01 DIAGNOSIS — G060 Intracranial abscess and granuloma: Secondary | ICD-10-CM | POA: Insufficient documentation

## 2020-08-01 DIAGNOSIS — M869 Osteomyelitis, unspecified: Secondary | ICD-10-CM

## 2020-08-01 DIAGNOSIS — M542 Cervicalgia: Secondary | ICD-10-CM | POA: Diagnosis present

## 2020-08-01 IMAGING — MR MR HEAD WO/W CM
21 series · 48 of 48 positions shown · IV contrast (gadavist)
Comparison: MRI head with contrast [DATE]

CLINICAL DATA: Severe bilateral otomastoiditis. Skull base
osteomyelitis. Rule out intracranial abscess or granuloma.

EXAM:
MRI HEAD WITHOUT AND WITH CONTRAST
TECHNIQUE: Multiplanar, multiecho pulse sequences of the brain and surrounding
structures were obtained without and with intravenous contrast.
CONTRAST:  7mL GADAVIST GADOBUTROL 1 MMOL/ML IV SOLN

[Series 5: DWI · axial · 3.0mm · 1.36mm/px · z∈[-47,+110]mm · 6 of 108 slices shown (1 of 2)]
[im 1/108]
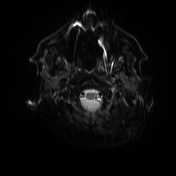
[im 22/108]
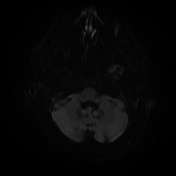
[im 43/108]
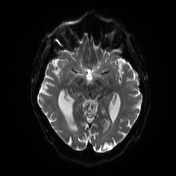
[im 65/108]
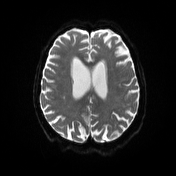
[im 86/108]
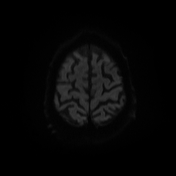
[im 108/108]
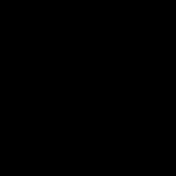

[Series 6: DWI · axial · 3.0mm · 1.36mm/px · z∈[-47,+104]mm · 2 of 52 slices shown (2 of 2)]
[im 1/52]
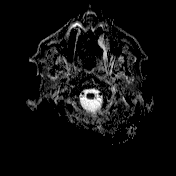
[im 52/52]
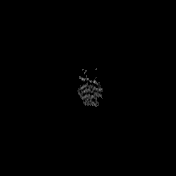

[Series 7: T1 · sagittal · 5.0mm · 0.75mm/px · 1 of 28 slices shown (1 of 4)]
[im 1/28]
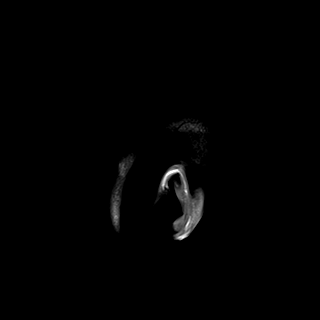

[Series 8: T2 · axial · 5.0mm · 0.62mm/px · 1 of 25 slices shown (1 of 3)]
[im 1/25]
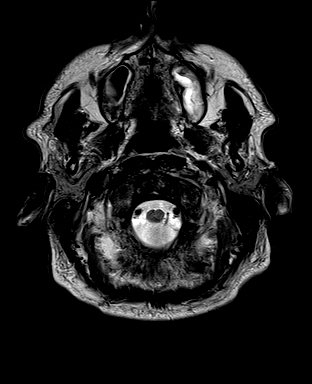

[Series 9: swi_images · axial · 3.0mm · 0.75mm/px · z∈[-45,+106]mm · 2 of 52 slices shown]
[im 1/52]
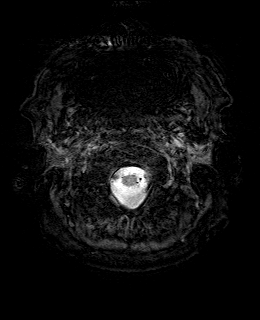
[im 52/52]
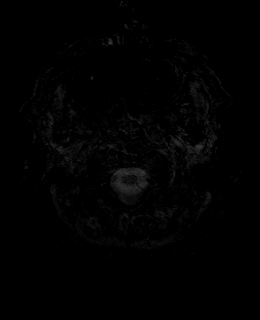

[Series 11: FLAIR · axial · 3.0mm · 0.75mm/px · z∈[-45,+106]mm · 2 of 52 slices shown]
[im 1/52]
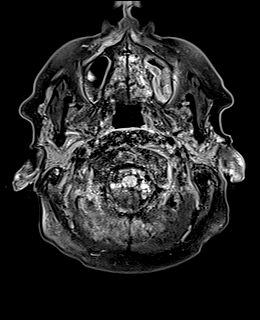
[im 52/52]
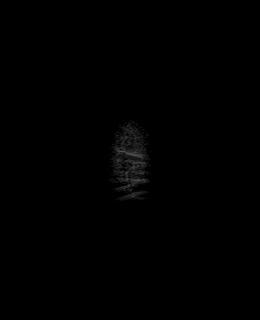

[Series 12: T1 · axial · 1.0mm · 0.94mm/px · z∈[-48,+108]mm · 7 of 160 slices shown (2 of 4)]
[im 1/160]
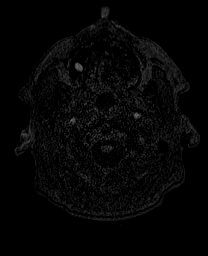
[im 27/160]
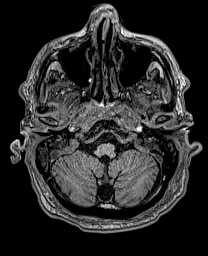
[im 54/160]
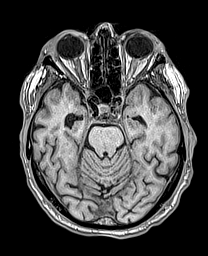
[im 80/160]
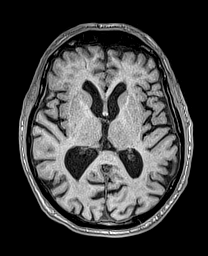
[im 107/160]
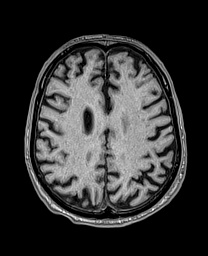
[im 133/160]
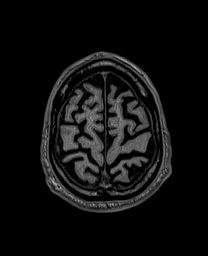
[im 160/160]
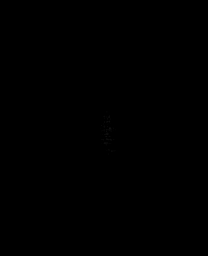

[Series 13: cor dwi_tracew · coronal · 5.0mm · 1.53mm/px · 3 of 64 slices shown]
[im 1/64]
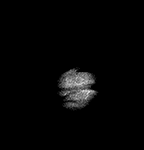
[im 32/64]
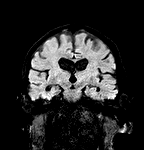
[im 64/64]
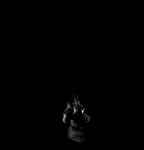

[Series 14: cor dwi_adc · coronal · 5.0mm · 1.53mm/px · 1 of 32 slices shown]
[im 1/32]
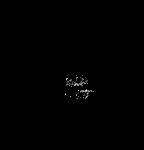

[Series 15: T2 · coronal · 5.0mm · 0.57mm/px · 2 of 40 slices shown (2 of 3)]
[im 1/40]
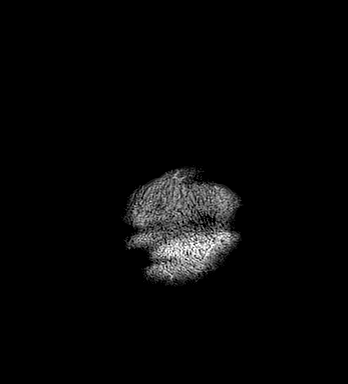
[im 40/40]
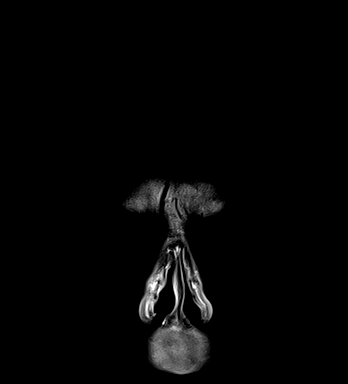

[Series 20: T1 · sagittal · 3.0mm · 0.69mm/px · 1 of 15 slices shown (3 of 4)]
[im 1/15]
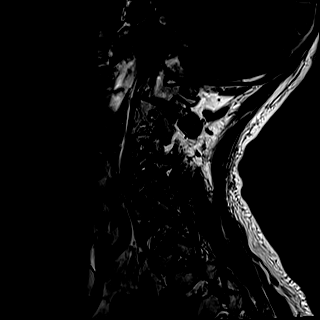

[Series 21: STIR · sagittal · 3.0mm · 0.86mm/px · 1 of 16 slices shown]
[im 1/16]
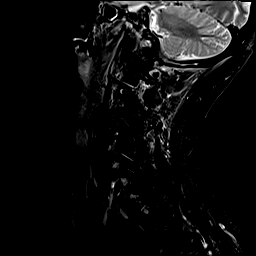

[Series 22: T2 · axial · 3.0mm · 0.70mm/px · z∈[-209,-65]mm · 2 of 40 slices shown (3 of 3)]
[im 1/40]
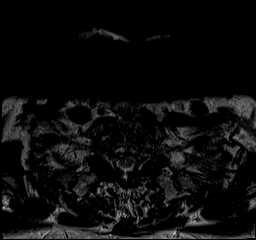
[im 40/40]
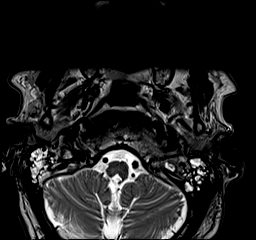

[Series 23: GRE · axial · 3.0mm · 0.35mm/px · z∈[-210,-65]mm · 2 of 42 slices shown]
[im 1/42]
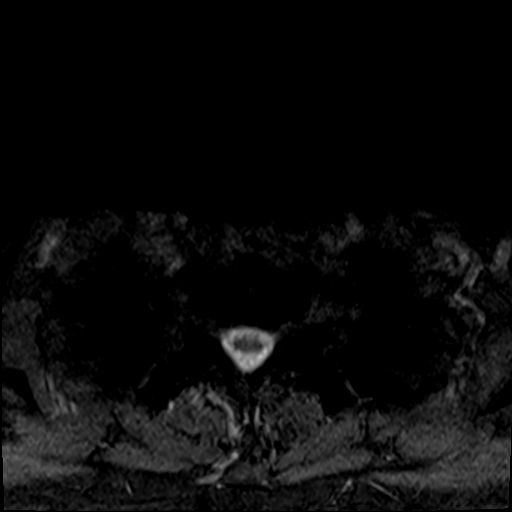
[im 42/42]
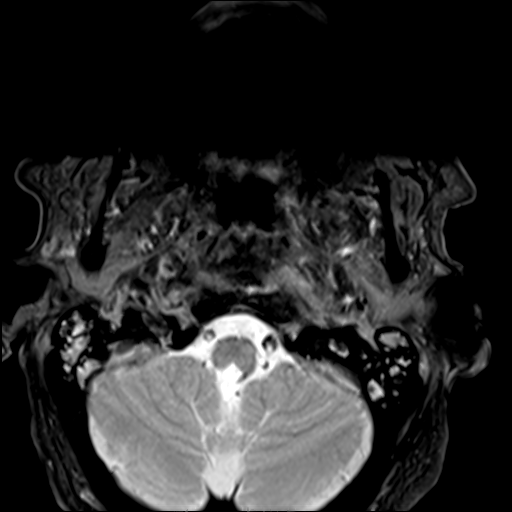

[Series 24: T1 · axial · 3.0mm · 0.35mm/px · z∈[-210,-65]mm · 2 of 42 slices shown (4 of 4)]
[im 1/42]
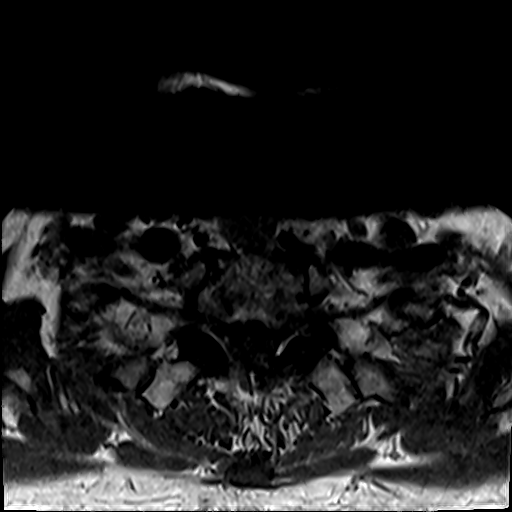
[im 42/42]
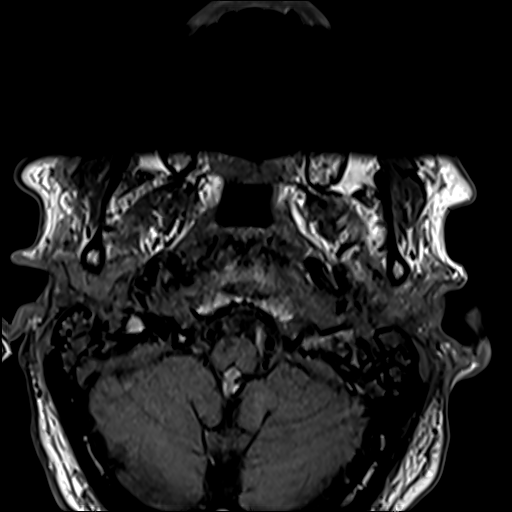

[Series 25: T2 post-contrast · sagittal · 3.0mm · 0.69mm/px · 1 of 15 slices shown]
[im 1/15]
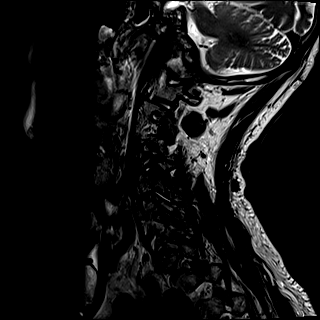

[Series 26: T1 fat-sat post-contrast · sagittal · 3.0mm · 0.69mm/px · 1 of 15 slices shown]
[im 1/15]
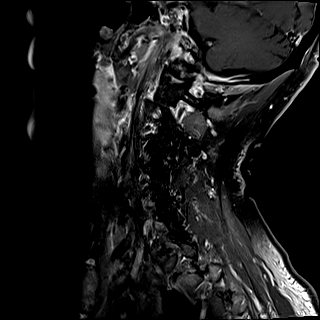

[Series 28: T1 post-contrast · axial · 3.0mm · 0.35mm/px · z∈[-210,-65]mm · 2 of 42 slices shown (1 of 4)]
[im 1/42]
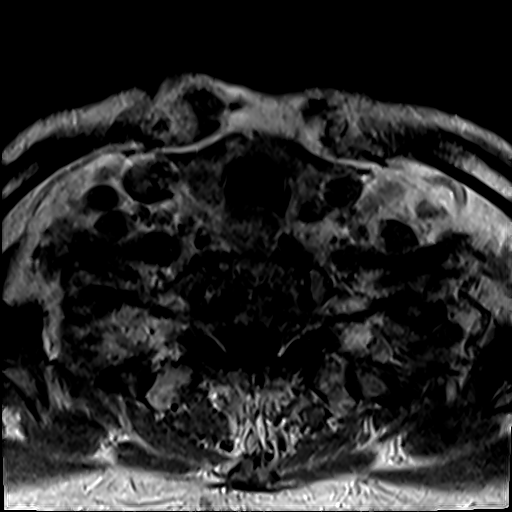
[im 42/42]
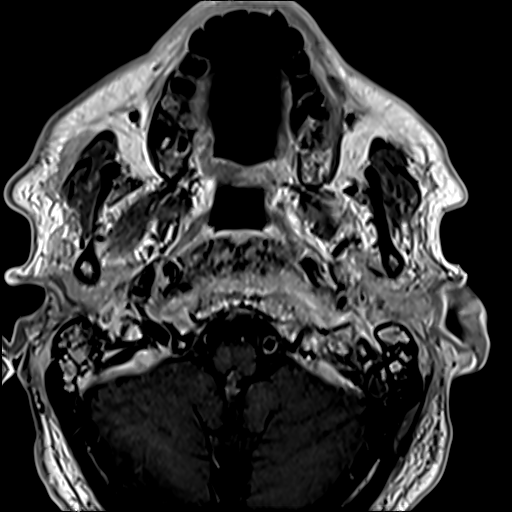

[Series 29: T1 post-contrast · axial · 1.0mm · 0.94mm/px · z∈[-48,+108]mm · 7 of 160 slices shown (2 of 4)]
[im 1/160]
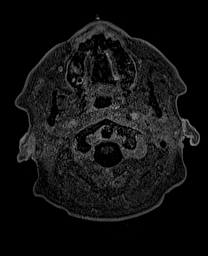
[im 27/160]
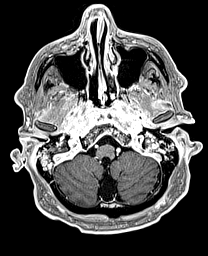
[im 54/160]
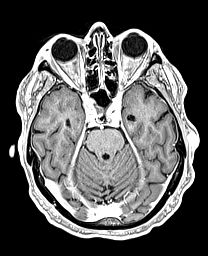
[im 80/160]
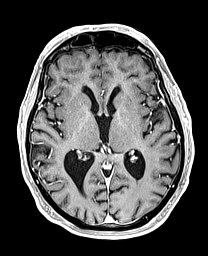
[im 107/160]
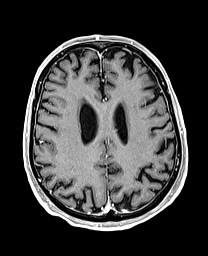
[im 133/160]
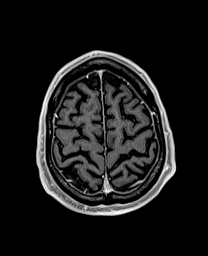
[im 160/160]
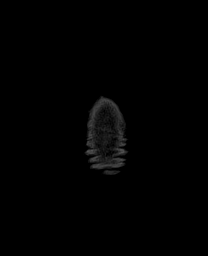

[Series 30: T1 post-contrast · coronal · 5.0mm · 0.43mm/px · 1 of 32 slices shown (3 of 4)]
[im 1/32]
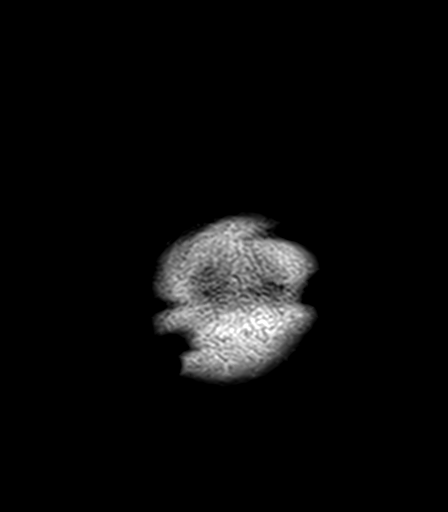

[Series 31: T1 post-contrast · sagittal · 5.0mm · 0.75mm/px · 1 of 28 slices shown (4 of 4)]
[im 1/28]
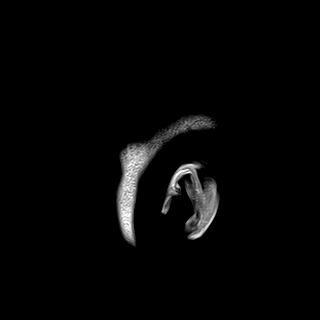

[48 of 48 positions shown; findings below may reference images not displayed]

FINDINGS: Brain: Mild atrophy. Mild ventricular enlargement due to atrophy,
stable. Scattered small white matter hyperintensities, mild in
degree. Mild hyperintensity in the pons. Negative for acute infarct
or hemorrhage.

Following contrast infusion, there is mild dural thickening
enhancement in the posterior fossa bilaterally which appears
improved. Improved dural enhancement above the tegmen of the mastoid
sinus bilaterally. No intracranial abscess. No brain edema.

Vascular: Normal arterial flow voids

Filling defects in the sigmoid sinus bilaterally likely due to
thrombus. There is compression of the jugular vein due to extensive
soft tissue swelling. Transverse sinus and proximal sigmoid sinus
show normal enhancement.

Skull and upper cervical spine: Improved edema in the skull base and
adjacent soft tissues inferior to the skull base. Improved edema at
C1-2 compatible with septic arthritis.

Sinuses/Orbits: Mucosal edema paranasal sinuses, with interval
improvement especially in the sphenoid sinus. Bilateral cataract
extraction

Bilateral mastoid effusion with interval improvement.

Other: None
IMPRESSION: 1. Findings compatible with skull base osteomyelitis and infection
in the soft tissues inferior to the skull base. Overall improvement
since the prior study. Improvement in posterior nasopharyngeal soft
tissue abscess. There is improvement in dural enhancement above the
tegmen bilaterally. No intracranial abscess. Diffuse dural
thickening in the posterior fossa presumably due to intracranial
spread of infection appears improved.
2. Mild thrombus in the sigmoid sinus and proximal jugular vein
similar to the prior study.
3. Negative for acute cerebral infarct

## 2020-08-01 IMAGING — MR MR CERVICAL SPINE WO/W CM
21 series · 48 of 48 positions shown · IV contrast (gadavist)
Comparison: MRI cervical spine [DATE]

CLINICAL DATA: Severe bilateral otomastoiditis. Skull base
osteomyelitis with phlegmon.

EXAM:
MRI CERVICAL SPINE WITHOUT AND WITH CONTRAST
TECHNIQUE: Multiplanar and multiecho pulse sequences of the cervical spine, to
include the craniocervical junction and cervicothoracic junction,
were obtained without and with intravenous contrast.
CONTRAST:  7mL GADAVIST GADOBUTROL 1 MMOL/ML IV SOLN

[Series 5: DWI · axial · 3.0mm · 1.36mm/px · z∈[-47,+110]mm · 6 of 108 slices shown (1 of 2)]
[im 1/108]
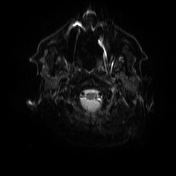
[im 22/108]
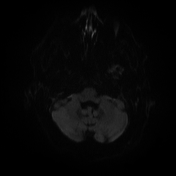
[im 43/108]
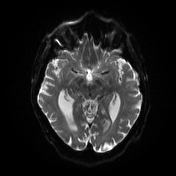
[im 65/108]
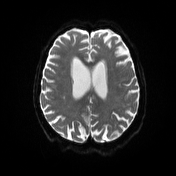
[im 86/108]
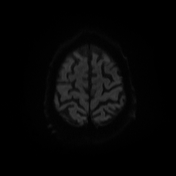
[im 108/108]
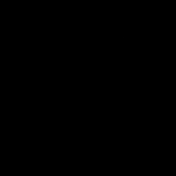

[Series 6: DWI · axial · 3.0mm · 1.36mm/px · z∈[-47,+104]mm · 2 of 52 slices shown (2 of 2)]
[im 1/52]
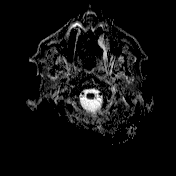
[im 52/52]
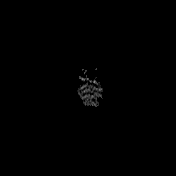

[Series 7: T1 · sagittal · 5.0mm · 0.75mm/px · 1 of 28 slices shown (1 of 4)]
[im 1/28]
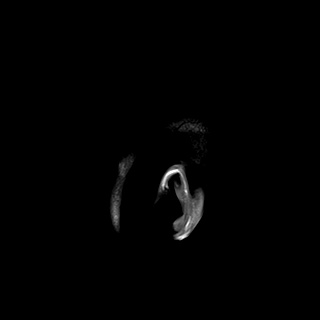

[Series 8: T2 · axial · 5.0mm · 0.62mm/px · 1 of 25 slices shown (1 of 3)]
[im 1/25]
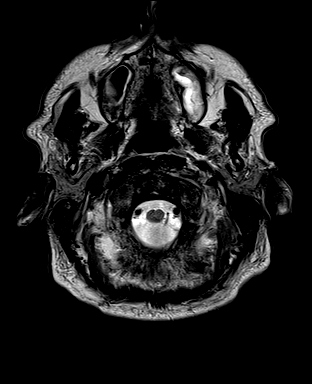

[Series 9: swi_images · axial · 3.0mm · 0.75mm/px · z∈[-45,+106]mm · 2 of 52 slices shown]
[im 1/52]
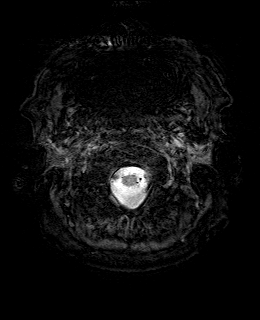
[im 52/52]
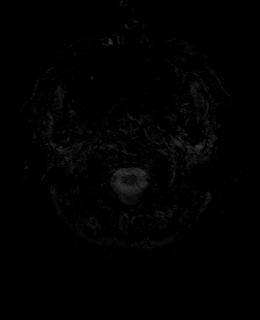

[Series 11: FLAIR · axial · 3.0mm · 0.75mm/px · z∈[-45,+106]mm · 2 of 52 slices shown]
[im 1/52]
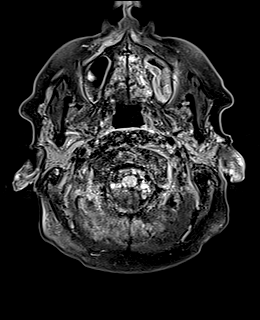
[im 52/52]
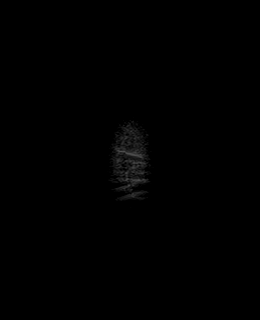

[Series 12: T1 · axial · 1.0mm · 0.94mm/px · z∈[-48,+108]mm · 7 of 160 slices shown (2 of 4)]
[im 1/160]
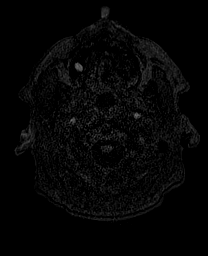
[im 27/160]
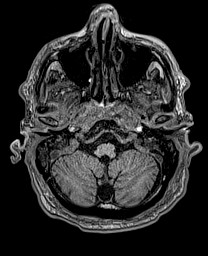
[im 54/160]
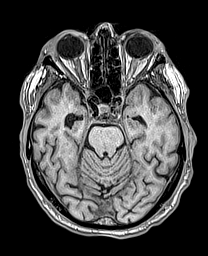
[im 80/160]
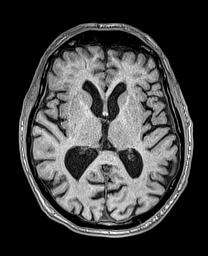
[im 107/160]
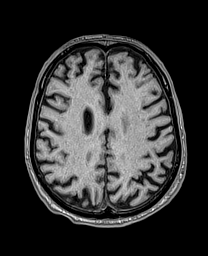
[im 133/160]
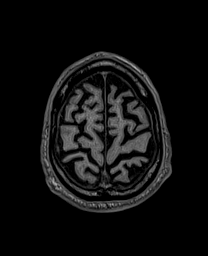
[im 160/160]
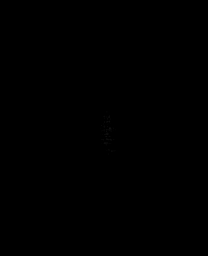

[Series 13: cor dwi_tracew · coronal · 5.0mm · 1.53mm/px · 3 of 64 slices shown]
[im 1/64]
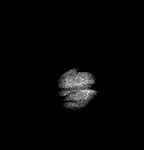
[im 32/64]
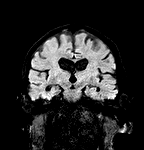
[im 64/64]
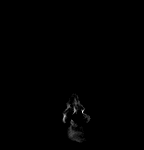

[Series 14: cor dwi_adc · coronal · 5.0mm · 1.53mm/px · 1 of 32 slices shown]
[im 1/32]
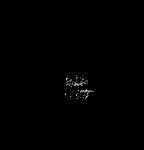

[Series 15: T2 · coronal · 5.0mm · 0.57mm/px · 2 of 40 slices shown (2 of 3)]
[im 1/40]
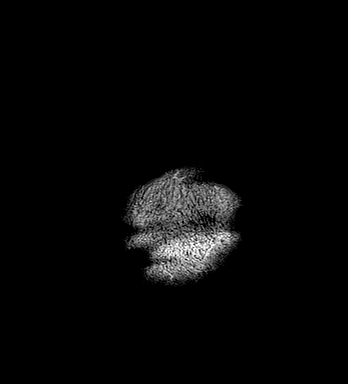
[im 40/40]
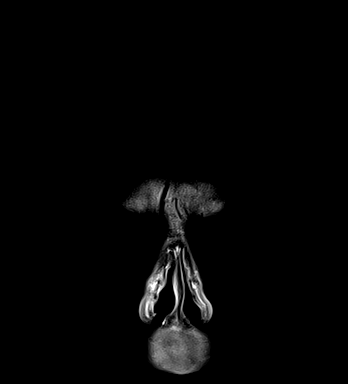

[Series 20: T1 · sagittal · 3.0mm · 0.69mm/px · 1 of 15 slices shown (3 of 4)]
[im 1/15]
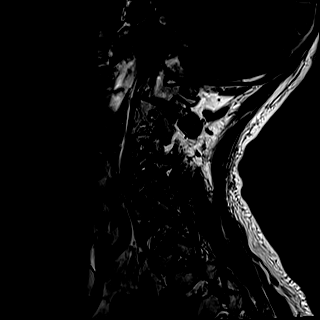

[Series 21: STIR · sagittal · 3.0mm · 0.86mm/px · 1 of 16 slices shown]
[im 1/16]
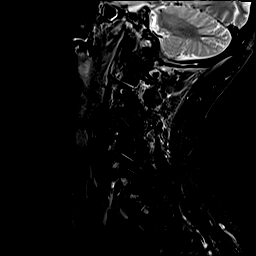

[Series 22: T2 · axial · 3.0mm · 0.70mm/px · z∈[-209,-65]mm · 2 of 40 slices shown (3 of 3)]
[im 1/40]
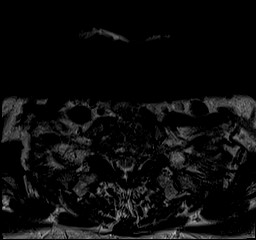
[im 40/40]
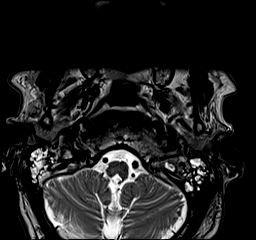

[Series 23: GRE · axial · 3.0mm · 0.35mm/px · z∈[-210,-65]mm · 2 of 42 slices shown]
[im 1/42]
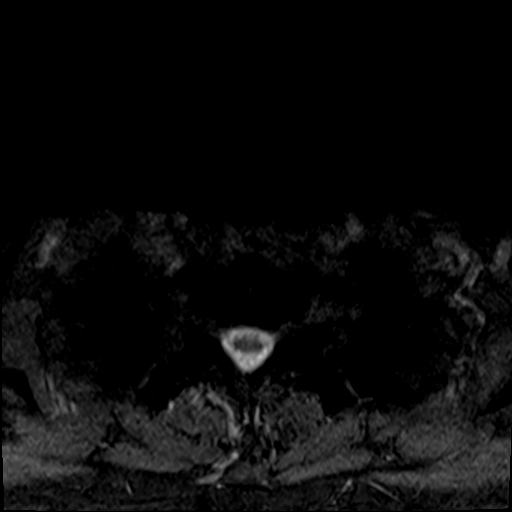
[im 42/42]
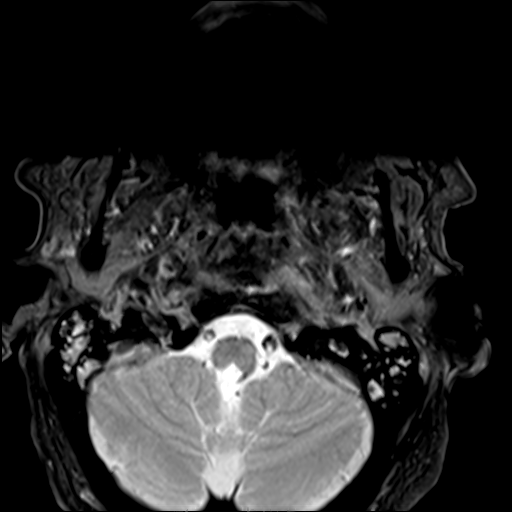

[Series 24: T1 · axial · 3.0mm · 0.35mm/px · z∈[-210,-65]mm · 2 of 42 slices shown (4 of 4)]
[im 1/42]
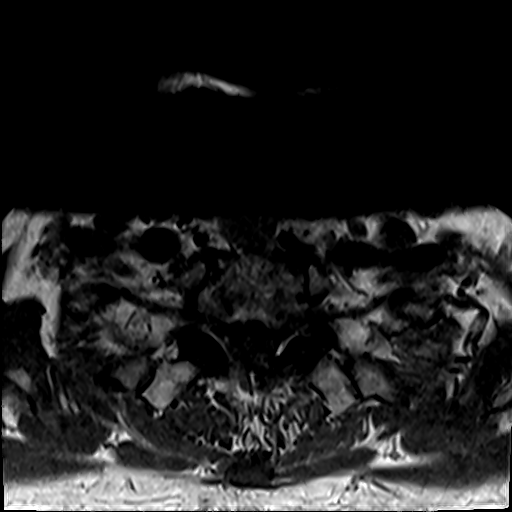
[im 42/42]
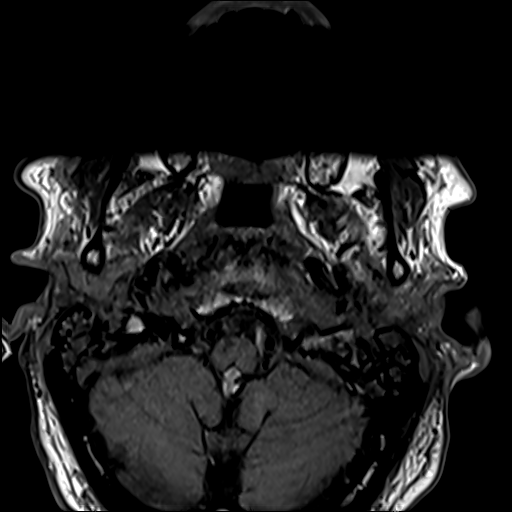

[Series 25: T2 post-contrast · sagittal · 3.0mm · 0.69mm/px · 1 of 15 slices shown]
[im 1/15]
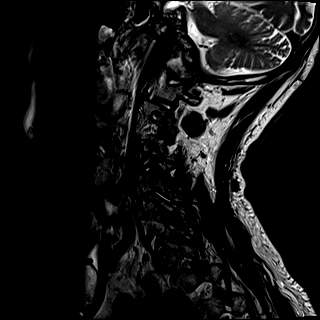

[Series 26: T1 fat-sat post-contrast · sagittal · 3.0mm · 0.69mm/px · 1 of 15 slices shown]
[im 1/15]
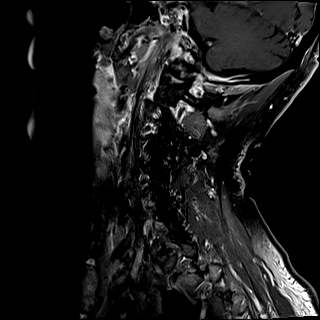

[Series 28: T1 post-contrast · axial · 3.0mm · 0.35mm/px · z∈[-210,-65]mm · 2 of 42 slices shown (1 of 4)]
[im 1/42]
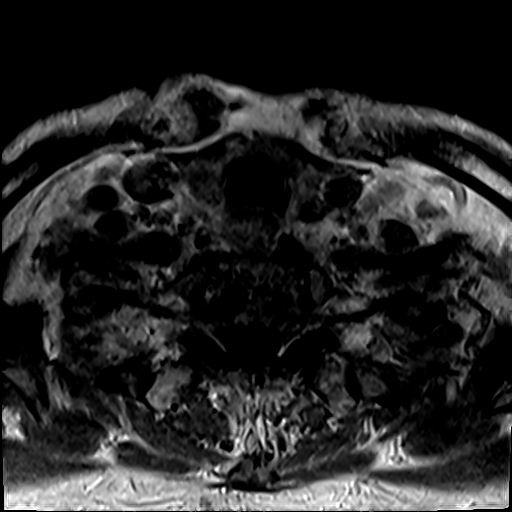
[im 42/42]
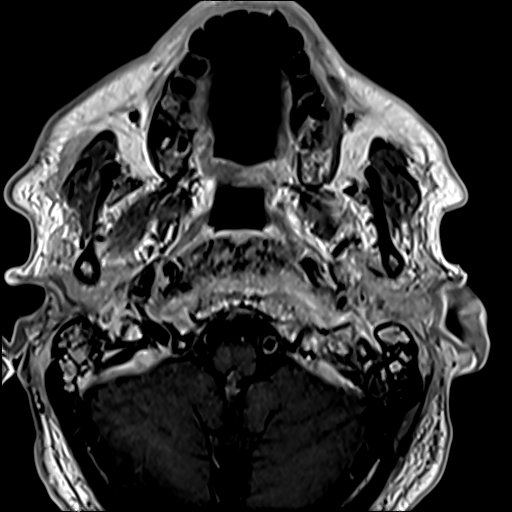

[Series 29: T1 post-contrast · axial · 1.0mm · 0.94mm/px · z∈[-48,+108]mm · 7 of 160 slices shown (2 of 4)]
[im 1/160]
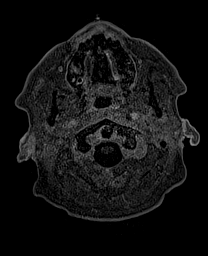
[im 27/160]
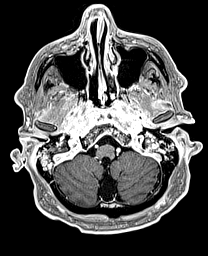
[im 54/160]
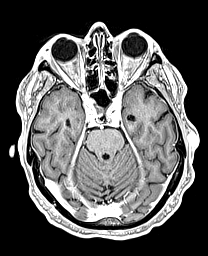
[im 80/160]
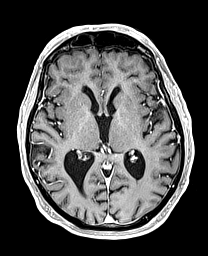
[im 107/160]
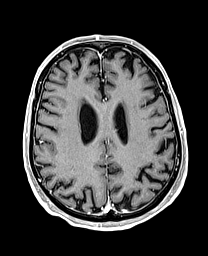
[im 133/160]
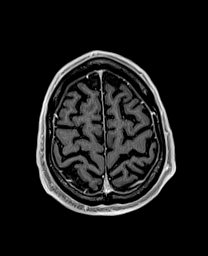
[im 160/160]
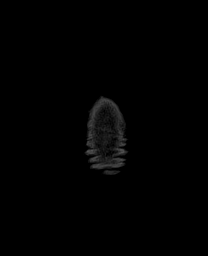

[Series 30: T1 post-contrast · coronal · 5.0mm · 0.43mm/px · 1 of 32 slices shown (3 of 4)]
[im 1/32]
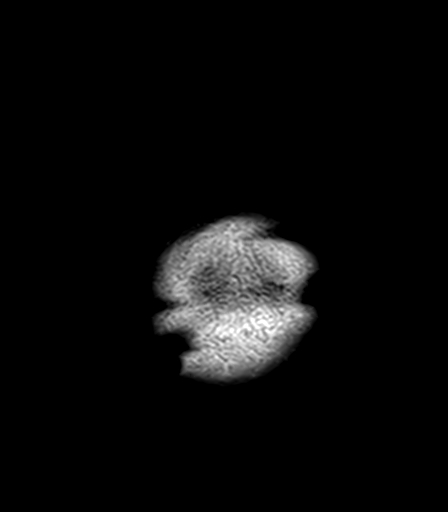

[Series 31: T1 post-contrast · sagittal · 5.0mm · 0.75mm/px · 1 of 28 slices shown (4 of 4)]
[im 1/28]
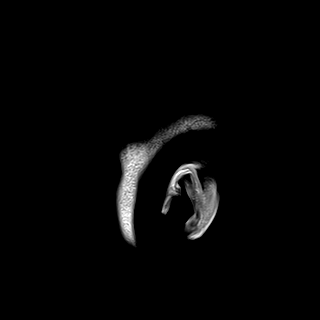

[48 of 48 positions shown; findings below may reference images not displayed]

FINDINGS: Alignment: Mild anterolisthesis C3-4, C4-5, C5-6

Vertebrae: Negative for fracture or mass

C1-2 arthropathy with bone marrow edema. Synovial enhancement at
C1-2 has improved in the interval likely due to improving septic
arthritis. No other areas of infection in the cervical spine.

Cord: No cord compression.  Normal cord signal.

Posterior Fossa, vertebral arteries, paraspinal tissues: Interval
improvement in soft tissue swelling and enhancement in the soft
tissues below the skull base compatible with improving infection.
Nonenhancing fluid collection in the posterior retropharyngeal soft
tissues has improved in the interval with decreased surrounding soft
tissue swelling.

Disc levels:

C1-2: Improving infection at this level.  No abscess.

C2-3: Negative

C3-4: Central disc protrusion and spurring. Bilateral facet
degeneration. Moderate foraminal narrowing bilaterally.

C4-5: Disc degeneration and diffuse uncinate spurring. Asymmetric
facet hypertrophy on the left. Mild spinal stenosis. Moderate to
severe left foraminal encroachment.

C5-6: Mild disc degeneration and moderate to advanced facet
degeneration. Mild to moderate foraminal stenosis bilaterally.

C6-7: Mild disc degeneration. Moderate to severe facet degeneration.
Mild foraminal narrowing bilaterally

C7-T1: Disc and facet degeneration with mild to moderate foraminal
narrowing bilaterally.
IMPRESSION: 1. Improving soft tissue swelling and enhancement below the skull
base compatible with improving infection. Posterior retropharyngeal
abscess has improved.
2. Probable infection at C1-2 has improved. No new areas of
infection or abscess in the cervical spine.

## 2020-08-01 MED ORDER — GADOBUTROL 1 MMOL/ML IV SOLN
7.0000 mL | Freq: Once | INTRAVENOUS | Status: AC | PRN
  Administered 2020-08-01: 7 mL via INTRAVENOUS

## 2020-08-02 ENCOUNTER — Telehealth: Payer: Self-pay

## 2020-08-02 NOTE — Telephone Encounter (Signed)
Spoke with patient's daughter Lorina Rabon and scheduled an in-person Palliative Consult for 08/23/20 @ 8:30 AM  with Dr. Bufford Spikes. Documentation will be noted in [facility]'s EMR [employer]. Daughter requested appt in August due to vacation.   COVID screening was negative. No pets in home. Patient lives with daughter Lorina Rabon.   Consent obtained; updated Outlook/Netsmart/Team List and Epic.   Family is aware they may be receiving a call from NP the day before or day of to confirm appointment.

## 2020-08-09 ENCOUNTER — Other Ambulatory Visit: Payer: Self-pay

## 2020-08-09 ENCOUNTER — Telehealth: Payer: Self-pay

## 2020-08-09 ENCOUNTER — Encounter: Payer: Self-pay | Admitting: Family

## 2020-08-09 ENCOUNTER — Ambulatory Visit (INDEPENDENT_AMBULATORY_CARE_PROVIDER_SITE_OTHER): Payer: Medicare Other | Admitting: Family

## 2020-08-09 VITALS — BP 168/73 | HR 62 | Temp 97.3°F | Wt 174.0 lb

## 2020-08-09 DIAGNOSIS — A498 Other bacterial infections of unspecified site: Secondary | ICD-10-CM

## 2020-08-09 DIAGNOSIS — M869 Osteomyelitis, unspecified: Secondary | ICD-10-CM | POA: Diagnosis not present

## 2020-08-09 DIAGNOSIS — H7093 Unspecified mastoiditis, bilateral: Secondary | ICD-10-CM

## 2020-08-09 NOTE — Progress Notes (Signed)
Subjective:    Patient ID: Nicolas James, male    DOB: [birth year], 87 y.o.   MRN: [id number]  Chief Complaint  Patient presents with   Follow-up    HPI:  Nicolas James is a 87 y.o. male with osteomyelitis of the skull, Mastoiditis bilaterally, and intracranial abscess and granuloma who was last seen on 07/17/20 and continued on cefepime for Pseudomonas infection that was resistance to florquinolones. MRI of the brain on 08/01/20 with findings compatible with osteomyelitis and infection in the soft tissues inferior to the skull base with overall improvement in posterior nasopharyngeal soft tissue abscess and dural enhancement above the tegmen bilaterally. Here today for follow up.  [name] continues to receive his cefepime as prescribed. Denies any fevers/chills. Has had improvements in hearing since leaving the hospital. Continues to live with his daughter and is here with another daughter. PICC line in place and functioning both drawing blood and infusing. Asking when medication can be stopped and is concerned about the abscess in his head.   No Known Allergies    Outpatient Medications Prior to Visit  Medication Sig Dispense Refill   apixaban (ELIQUIS) 5 MG TABS tablet Take 1 tablet (5 mg total) by mouth 2 (two) times daily. 180 tablet 4   cholecalciferol (VITAMIN D) 25 MCG (1000 UNIT) tablet Take 1,000 Units by mouth daily.     insulin detemir (LEVEMIR) 100 UNIT/ML injection Inject 7 Units into the skin at bedtime.     insulin regular (NOVOLIN R) 100 units/mL injection Inject 1-10 Units into the skin See admin instructions. Per sliding scale 3 times daily with meals 70-150=0 units 151-200=1 units 201-250=2 units  251-300=3 units 301-350=4 units 351-400=6 units Greater than 400=10 units     levothyroxine (SYNTHROID) 88 MCG tablet Take 88 mcg by mouth daily.     lisinopril (ZESTRIL) 10 MG tablet Take 1 tablet (10 mg total) by mouth at bedtime. 30 tablet 0   metFORMIN (GLUCOPHAGE) 850 MG  tablet Take 850 mg by mouth 2 (two) times daily with a meal.     predniSONE (DELTASONE) 10 MG tablet Take 10 mg by mouth daily with breakfast.     simvastatin (ZOCOR) 40 MG tablet Take 40 mg by mouth at bedtime.     timolol (BETIMOL) 0.5 % ophthalmic solution Place 1 drop into the right eye 2 (two) times daily.     tobramycin-dexamethasone (TOBRADEX) ophthalmic solution Place 4 drops into both ears 2 (two) times daily. 5 mL 0   vitamin B-12 (CYANOCOBALAMIN) 1000 MCG tablet Take 1,000 mcg by mouth daily.     White Petrolatum-Mineral Oil (REFRESH LACRI-LUBE) OINT Place 1 application into the right eye as needed (dry eye).     brimonidine (ALPHAGAN) 0.2 % ophthalmic solution Place 1 drop into the right eye in the morning and at bedtime. (Patient not taking: Reported on 08/09/2020)     No facility-administered medications prior to visit.     Past Medical History:  Diagnosis Date   Diabetes mellitus without complication (HCC)    Hypertension    Thyroid disease      Past Surgical History:  Procedure Laterality Date   JOINT REPLACEMENT Bilateral    Hips and Knees       Review of Systems  Constitutional:  Negative for chills, diaphoresis, fatigue and fever.  Respiratory:  Negative for cough, chest tightness, shortness of breath and wheezing.   Cardiovascular:  Negative for chest pain.  Gastrointestinal:  Negative for abdominal pain, diarrhea, nausea  and vomiting.     Objective:    BP (!) 168/73   Pulse 62   Temp (!) 97.3 F (36.3 C) (Oral)   Wt 174 lb (78.9 kg)   BMI 25.70 kg/m  Nursing note and vital signs reviewed.  Physical Exam Constitutional:      General: He is not in acute distress.    Appearance: He is well-developed.     Comments: Seated in the chair; pleasant; very hard of hearing with some hearing noted.   Cardiovascular:     Rate and Rhythm: Normal rate and regular rhythm.     Heart sounds: Normal heart sounds.  Pulmonary:     Effort: Pulmonary effort is  normal.     Breath sounds: Normal breath sounds.  Skin:    General: Skin is warm and dry.  Neurological:     Mental Status: He is alert and oriented to person, place, and time.  Psychiatric:        Behavior: Behavior normal.        Thought Content: Thought content normal.        Judgment: Judgment normal.     No flowsheet data found.     Assessment & Plan:    Patient Active Problem List   Diagnosis Date Noted   Osteomyelitis of skull (HCC) 06/19/2020    Priority: High   Pseudomonas aeruginosa infection 06/19/2020    Priority: High   Cervicalgia 07/17/2020   Medication monitoring encounter 07/17/2020   Mastoiditis of both sides 07/17/2020   Cerebral venous sinus thrombosis    Hypertensive urgency 06/19/2020   Anemia 06/19/2020   Insulin dependent type 2 diabetes mellitus (HCC) 06/19/2020   Mastoiditis 06/18/2020     Problem List Items Addressed This Visit       Musculoskeletal and Integument   Osteomyelitis of skull (HCC) - Primary   Mastoiditis of both sides     Other   Pseudomonas aeruginosa infection    Mr. Cervenka continues to receive cefepime for florquinolone resistant Pseudomonas infection with osteomyelitis of the skull and improvement in abscess. Inflammatory markers are improved and clinically appears to be progressing. Discussed that he will need to continue cefepime for now as we do not have any oral options as the abscess is improved but not fully resolved. Will continue for at least 1 month and then re-evaluate to determine need for additional antibiotic treatment.          I am having Lenice Pressman maintain his metFORMIN, levothyroxine, simvastatin, insulin detemir, insulin regular, vitamin B-12, cholecalciferol, timolol, brimonidine, Refresh Lacri-Lube, tobramycin-dexamethasone, lisinopril, apixaban, and predniSONE.   Follow-up: Return in about 1 month (around Oct 01, 2020), or if symptoms worsen or fail to improve.   Marcos Eke, MSN, FNP-C Nurse  Practitioner [facility] for Infectious Disease [facility] Main number: [phone]

## 2020-08-09 NOTE — Patient Instructions (Addendum)
Nice to see you.  We will continue your current dose of cefepime for 4 weeks.   Will arrange follow up with Dr. Elinor Parkinson in 1 month or sooner if needed.  Let us know if you have any questions.

## 2020-08-09 NOTE — Telephone Encounter (Signed)
Called [facility] and spoke with Melissa to give verbal orders to continue IV antibiotics through August 22nd - per Jeanine Luz, FNP. Melissa repeated orders back and verbalized her understanding.   Nicolas James, CMA

## 2020-08-09 NOTE — Assessment & Plan Note (Signed)
Nicolas James continues to receive cefepime for florquinolone resistant Pseudomonas infection with osteomyelitis of the skull and improvement in abscess. Inflammatory markers are improved and clinically appears to be progressing. Discussed that he will need to continue cefepime for now as we do not have any oral options as the abscess is improved but not fully resolved. Will continue for at least 1 month and then re-evaluate to determine need for additional antibiotic treatment.

## 2020-08-13 ENCOUNTER — Encounter (HOSPITAL_COMMUNITY)
Admission: RE | Admit: 2020-08-13 | Discharge: 2020-08-13 | Disposition: A | Discharge status: Home / Self Care | Payer: Medicare Other | Source: Ambulatory Visit | Attending: Orthopedic Surgery | Admitting: Orthopedic Surgery

## 2020-08-13 ENCOUNTER — Other Ambulatory Visit: Payer: Self-pay

## 2020-08-13 DIAGNOSIS — T84033A Mechanical loosening of internal left knee prosthetic joint, initial encounter: Secondary | ICD-10-CM | POA: Insufficient documentation

## 2020-08-13 DIAGNOSIS — T84032A Mechanical loosening of internal right knee prosthetic joint, initial encounter: Secondary | ICD-10-CM

## 2020-08-13 IMAGING — NM NM BONE 3 PHASE
2 series · 12 of 12 positions shown · non-contrast
Comparison: None.

CLINICAL DATA: History of bilateral knee arthroplasty. Rule out
right total knee arthroplasty device loosening.

EXAM:
NUCLEAR MEDICINE 3-PHASE BONE SCAN
TECHNIQUE: Radionuclide angiographic images, immediate static blood pool
images, and 3-hour delayed static images were obtained of the knees
after intravenous injection of radiopharmaceutical.
RADIOPHARMACEUTICALS:  21.8 mCi [PK] MDP IV

[Series 1: flow · 4.14mm/px · 6 of 40 frames shown (1 of 2)]
[frame 4/40  full-range]
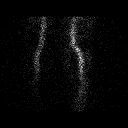
[frame 10/40  full-range]
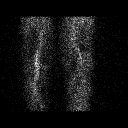
[frame 17/40  full-range]
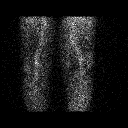
[frame 24/40  full-range]
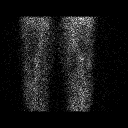
[frame 30/40  full-range]
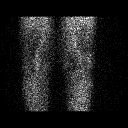
[frame 37/40  full-range]
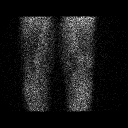

[Series 1: flow · 4.14mm/px · 6 of 40 frames shown (2 of 2)]
[frame 4/40  full-range]
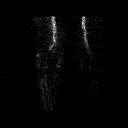
[frame 10/40  full-range]
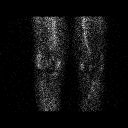
[frame 17/40  full-range]
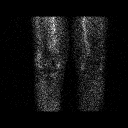
[frame 24/40  full-range]
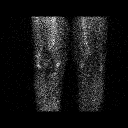
[frame 30/40  full-range]
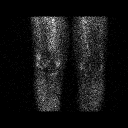
[frame 37/40  full-range]
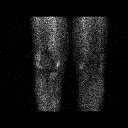

[12 of 12 positions shown; findings below may reference images not displayed]

FINDINGS: Vascular phase: No significant abnormal asymmetric increased blood
flow to the lower extremities.

Blood pool phase: Asymmetric increased tracer uptake localizes to
the medial and lateral femoral condyles of the right knee and medial
tibial plateau. There is also mild asymmetric increased blood pool
activity localizing to the patella.

Delayed phase: Asymmetric increased radiotracer uptake surrounding
the femoral components of the right knee arthroplasty device noted.
There is also asymmetric increased uptake localizing to the right
patella, right medial tibial plateau, and around the distal portion
of the tibial component of the right knee arthroplasty device.
IMPRESSION: 1. Nonspecific, asymmetric increased uptake on the blood pool and
delayed phase imaging localizes to the femoral and tibial components
of the right knee arthroplasty device. Correlate for any clinical
signs or symptoms of arthroplasty device loosening.

## 2020-08-13 MED ORDER — TECHNETIUM TC 99M MEDRONATE IV KIT
21.8000 | PACK | Freq: Once | INTRAVENOUS | Status: AC
  Administered 2020-08-13: 21.8 via INTRAVENOUS

## 2020-08-27 ENCOUNTER — Observation Stay (HOSPITAL_COMMUNITY): Payer: Medicare Other

## 2020-08-27 ENCOUNTER — Emergency Department (HOSPITAL_COMMUNITY): Payer: Medicare Other

## 2020-08-27 ENCOUNTER — Inpatient Hospital Stay (HOSPITAL_COMMUNITY)
Admission: EM | Admit: 2020-08-27 | Discharge: 2020-08-31 | DRG: 682 | Disposition: A | Discharge status: Hospice | Payer: Medicare Other | Attending: Internal Medicine | Admitting: Internal Medicine

## 2020-08-27 ENCOUNTER — Other Ambulatory Visit: Payer: Self-pay

## 2020-08-27 ENCOUNTER — Other Ambulatory Visit (HOSPITAL_COMMUNITY): Payer: Self-pay

## 2020-08-27 ENCOUNTER — Encounter (HOSPITAL_COMMUNITY): Payer: Self-pay

## 2020-08-27 DIAGNOSIS — Z792 Long term (current) use of antibiotics: Secondary | ICD-10-CM

## 2020-08-27 DIAGNOSIS — E1169 Type 2 diabetes mellitus with other specified complication: Secondary | ICD-10-CM | POA: Diagnosis present

## 2020-08-27 DIAGNOSIS — Z1623 Resistance to quinolones and fluoroquinolones: Secondary | ICD-10-CM | POA: Diagnosis present

## 2020-08-27 DIAGNOSIS — Z6825 Body mass index (BMI) 25.0-25.9, adult: Secondary | ICD-10-CM

## 2020-08-27 DIAGNOSIS — Z96643 Presence of artificial hip joint, bilateral: Secondary | ICD-10-CM | POA: Diagnosis present

## 2020-08-27 DIAGNOSIS — Z794 Long term (current) use of insulin: Secondary | ICD-10-CM

## 2020-08-27 DIAGNOSIS — M8668 Other chronic osteomyelitis, other site: Secondary | ICD-10-CM | POA: Diagnosis present

## 2020-08-27 DIAGNOSIS — N179 Acute kidney failure, unspecified: Secondary | ICD-10-CM | POA: Diagnosis not present

## 2020-08-27 DIAGNOSIS — E039 Hypothyroidism, unspecified: Secondary | ICD-10-CM | POA: Diagnosis present

## 2020-08-27 DIAGNOSIS — Z95828 Presence of other vascular implants and grafts: Secondary | ICD-10-CM

## 2020-08-27 DIAGNOSIS — Z20822 Contact with and (suspected) exposure to covid-19: Secondary | ICD-10-CM | POA: Diagnosis present

## 2020-08-27 DIAGNOSIS — G9341 Metabolic encephalopathy: Secondary | ICD-10-CM | POA: Diagnosis present

## 2020-08-27 DIAGNOSIS — Z66 Do not resuscitate: Secondary | ICD-10-CM | POA: Diagnosis present

## 2020-08-27 DIAGNOSIS — G9340 Encephalopathy, unspecified: Secondary | ICD-10-CM | POA: Diagnosis present

## 2020-08-27 DIAGNOSIS — I82C23 Chronic embolism and thrombosis of internal jugular vein, bilateral: Secondary | ICD-10-CM | POA: Diagnosis present

## 2020-08-27 DIAGNOSIS — Z79899 Other long term (current) drug therapy: Secondary | ICD-10-CM

## 2020-08-27 DIAGNOSIS — G928 Other toxic encephalopathy: Secondary | ICD-10-CM

## 2020-08-27 DIAGNOSIS — E119 Type 2 diabetes mellitus without complications: Secondary | ICD-10-CM

## 2020-08-27 DIAGNOSIS — R627 Adult failure to thrive: Secondary | ICD-10-CM | POA: Diagnosis present

## 2020-08-27 DIAGNOSIS — B965 Pseudomonas (aeruginosa) (mallei) (pseudomallei) as the cause of diseases classified elsewhere: Secondary | ICD-10-CM | POA: Diagnosis present

## 2020-08-27 DIAGNOSIS — H7093 Unspecified mastoiditis, bilateral: Secondary | ICD-10-CM | POA: Diagnosis present

## 2020-08-27 DIAGNOSIS — G08 Intracranial and intraspinal phlebitis and thrombophlebitis: Secondary | ICD-10-CM | POA: Diagnosis present

## 2020-08-27 DIAGNOSIS — G510 Bell's palsy: Secondary | ICD-10-CM | POA: Diagnosis present

## 2020-08-27 DIAGNOSIS — I160 Hypertensive urgency: Secondary | ICD-10-CM | POA: Diagnosis present

## 2020-08-27 DIAGNOSIS — Z96653 Presence of artificial knee joint, bilateral: Secondary | ICD-10-CM | POA: Diagnosis present

## 2020-08-27 DIAGNOSIS — H9190 Unspecified hearing loss, unspecified ear: Secondary | ICD-10-CM | POA: Diagnosis present

## 2020-08-27 DIAGNOSIS — M4622 Osteomyelitis of vertebra, cervical region: Secondary | ICD-10-CM | POA: Diagnosis present

## 2020-08-27 DIAGNOSIS — M869 Osteomyelitis, unspecified: Secondary | ICD-10-CM | POA: Diagnosis present

## 2020-08-27 DIAGNOSIS — Z7901 Long term (current) use of anticoagulants: Secondary | ICD-10-CM

## 2020-08-27 DIAGNOSIS — I10 Essential (primary) hypertension: Secondary | ICD-10-CM | POA: Diagnosis present

## 2020-08-27 DIAGNOSIS — R809 Proteinuria, unspecified: Secondary | ICD-10-CM | POA: Diagnosis present

## 2020-08-27 DIAGNOSIS — E875 Hyperkalemia: Secondary | ICD-10-CM | POA: Diagnosis present

## 2020-08-27 DIAGNOSIS — T361X5A Adverse effect of cephalosporins and other beta-lactam antibiotics, initial encounter: Secondary | ICD-10-CM | POA: Diagnosis present

## 2020-08-27 LAB — I-STAT CHEM 8, ED
BUN: 76 mg/dL — ABNORMAL HIGH (ref 8–23)
Calcium, Ion: 1.18 mmol/L (ref 1.15–1.40)
Chloride: 101 mmol/L (ref 98–111)
Creatinine, Ser: 5.3 mg/dL — ABNORMAL HIGH (ref 0.61–1.24)
Glucose, Bld: 79 mg/dL (ref 70–99)
HCT: 38 % — ABNORMAL LOW (ref 39.0–52.0)
Hemoglobin: 12.9 g/dL — ABNORMAL LOW (ref 13.0–17.0)
Potassium: 3.8 mmol/L (ref 3.5–5.1)
Sodium: 135 mmol/L (ref 135–145)
TCO2: 24 mmol/L (ref 22–32)

## 2020-08-27 LAB — URINALYSIS, ROUTINE W REFLEX MICROSCOPIC
Bilirubin Urine: NEGATIVE
Glucose, UA: 50 mg/dL — AB
Ketones, ur: NEGATIVE mg/dL
Leukocytes,Ua: NEGATIVE
Nitrite: NEGATIVE
Protein, ur: 100 mg/dL — AB
Specific Gravity, Urine: 1.01 (ref 1.005–1.030)
pH: 8 (ref 5.0–8.0)

## 2020-08-27 LAB — DIFFERENTIAL
Abs Immature Granulocytes: 0.03 10*3/uL (ref 0.00–0.07)
Basophils Absolute: 0 10*3/uL (ref 0.0–0.1)
Basophils Relative: 1 %
Eosinophils Absolute: 0.2 10*3/uL (ref 0.0–0.5)
Eosinophils Relative: 3 %
Immature Granulocytes: 1 %
Lymphocytes Relative: 12 %
Lymphs Abs: 0.8 10*3/uL (ref 0.7–4.0)
Monocytes Absolute: 0.6 10*3/uL (ref 0.1–1.0)
Monocytes Relative: 10 %
Neutro Abs: 5 10*3/uL (ref 1.7–7.7)
Neutrophils Relative %: 73 %

## 2020-08-27 LAB — CBG MONITORING, ED
Glucose-Capillary: 147 mg/dL — ABNORMAL HIGH (ref 70–99)
Glucose-Capillary: 142 mg/dL — ABNORMAL HIGH (ref 70–99)
Glucose-Capillary: 104 mg/dL — ABNORMAL HIGH (ref 70–99)

## 2020-08-27 LAB — C-REACTIVE PROTEIN: CRP: <0.5 mg/dL (ref <1.0)

## 2020-08-27 LAB — RAPID URINE DRUG SCREEN, HOSP PERFORMED
Amphetamines: NOT DETECTED
Barbiturates: NOT DETECTED
Benzodiazepines: NOT DETECTED
Cocaine: NOT DETECTED
Opiates: NOT DETECTED
Tetrahydrocannabinol: NOT DETECTED

## 2020-08-27 LAB — SODIUM, URINE, RANDOM: Sodium, Ur: 69 mmol/L

## 2020-08-27 LAB — PROTIME-INR
INR: 1.3 — ABNORMAL HIGH (ref 0.8–1.2)
Prothrombin Time: 16.5 seconds — ABNORMAL HIGH (ref 11.4–15.2)

## 2020-08-27 LAB — BASIC METABOLIC PANEL
Anion gap: 13 (ref 5–15)
BUN: 81 mg/dL — ABNORMAL HIGH (ref 8–23)
CO2: 21 mmol/L — ABNORMAL LOW (ref 22–32)
Calcium: 9.2 mg/dL (ref 8.9–10.3)
Chloride: 99 mmol/L (ref 98–111)
Creatinine, Ser: 5.01 mg/dL — ABNORMAL HIGH (ref 0.61–1.24)
GFR, Estimated: 11 mL/min — ABNORMAL LOW (ref >60)
Glucose, Bld: 82 mg/dL (ref 70–99)
Potassium: 3.7 mmol/L (ref 3.5–5.1)
Sodium: 133 mmol/L — ABNORMAL LOW (ref 135–145)

## 2020-08-27 LAB — CBC
HCT: 34.6 % — ABNORMAL LOW (ref 39.0–52.0)
Hemoglobin: 11.9 g/dL — ABNORMAL LOW (ref 13.0–17.0)
MCH: 29.5 pg (ref 26.0–34.0)
MCHC: 34.4 g/dL (ref 30.0–36.0)
MCV: 85.6 fL (ref 80.0–100.0)
Platelets: 218 10*3/uL (ref 150–400)
RBC: 4.04 MIL/uL — ABNORMAL LOW (ref 4.22–5.81)
RDW: 13.8 % (ref 11.5–15.5)
WBC: 6.7 10*3/uL (ref 4.0–10.5)
nRBC: 0 % (ref 0.0–0.2)

## 2020-08-27 LAB — OSMOLALITY, URINE: Osmolality, Ur: 272 mOsm/kg — ABNORMAL LOW (ref 300–900)

## 2020-08-27 LAB — ETHANOL: Alcohol, Ethyl (B): <10 mg/dL (ref <10)

## 2020-08-27 LAB — APTT: aPTT: 32 seconds (ref 24–36)

## 2020-08-27 LAB — SEDIMENTATION RATE: Sed Rate: 31 mm/hr — ABNORMAL HIGH (ref 0–16)

## 2020-08-27 LAB — RESP PANEL BY RT-PCR (FLU A&B, COVID) ARPGX2
Influenza A by PCR: NEGATIVE
Influenza B by PCR: NEGATIVE
SARS Coronavirus 2 by RT PCR: NEGATIVE

## 2020-08-27 LAB — PROTEIN / CREATININE RATIO, URINE
Creatinine, Urine: 20.95 mg/dL
Protein Creatinine Ratio: 3.87 mg/mg{Cre} — ABNORMAL HIGH (ref 0.00–0.15)
Total Protein, Urine: 81 mg/dL

## 2020-08-27 IMAGING — CT CT HEAD CODE STROKE
3 series · 14 of 47 positions shown, 16 images · non-contrast
Comparison: MRI [DATE]

CLINICAL DATA: Code stroke. Neuro deficit, acute, stroke suspected.
Diabetes and hypertension.

EXAM:
CT HEAD WITHOUT CONTRAST
TECHNIQUE: Contiguous axial images were obtained from the base of the skull
through the vertex without intravenous contrast.

[Series 3: head 5.0 h30s · axial · 0.47mm/px · z∈[-139,+1]mm · 8 of 34 slices shown, 10 images]
[im 3/34  brain]
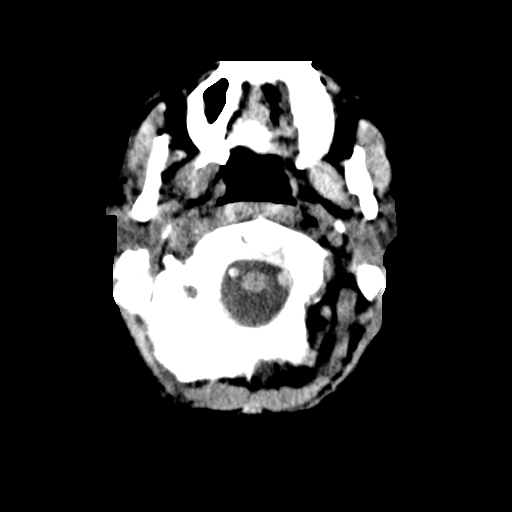
[im 3/34  bone]
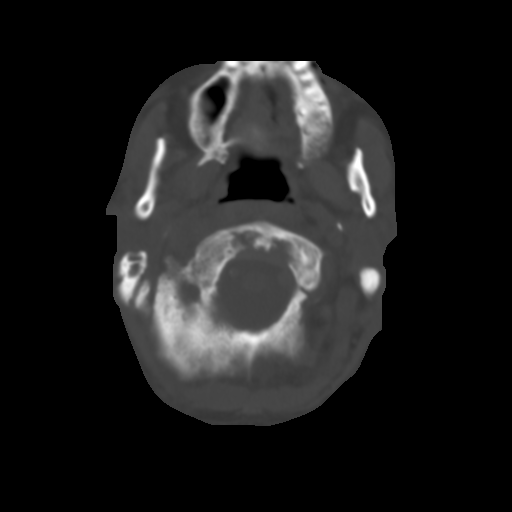
[im 7/34  brain]
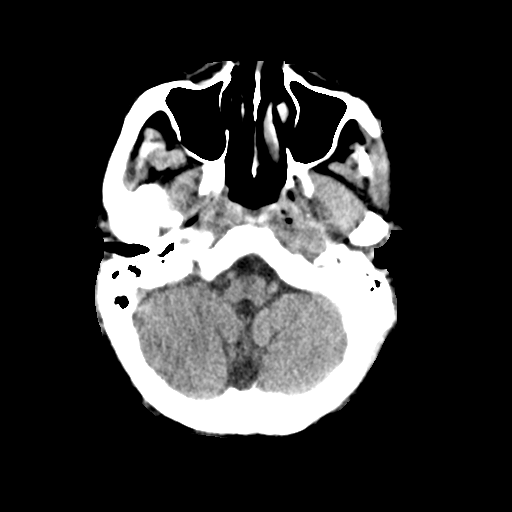
[im 11/34  brain]
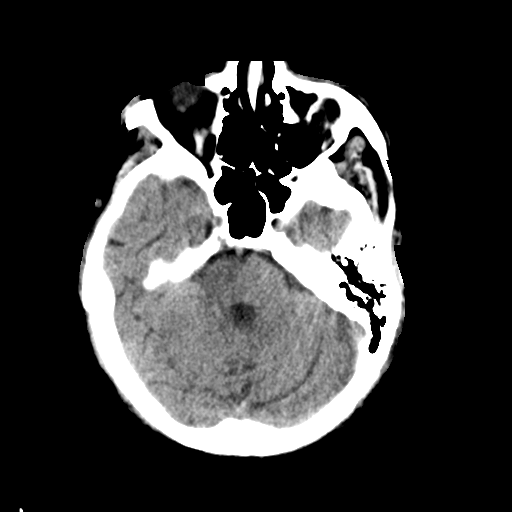
[im 15/34  brain]
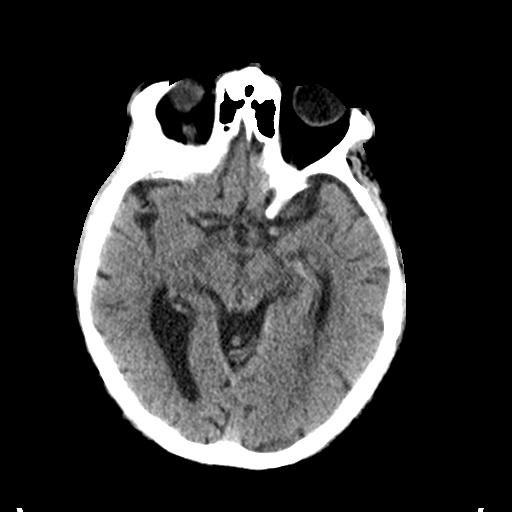
[im 19/34  brain]
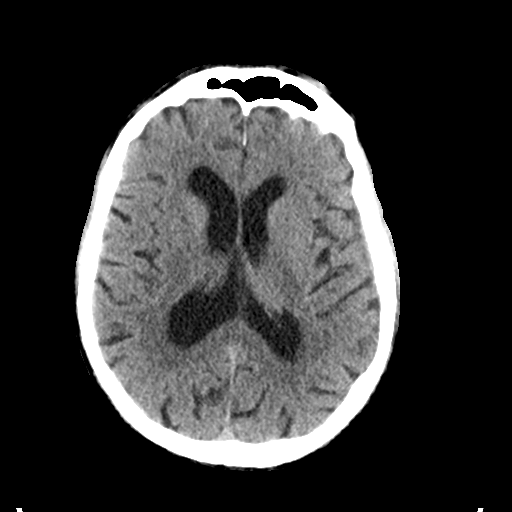
[im 19/34  bone]
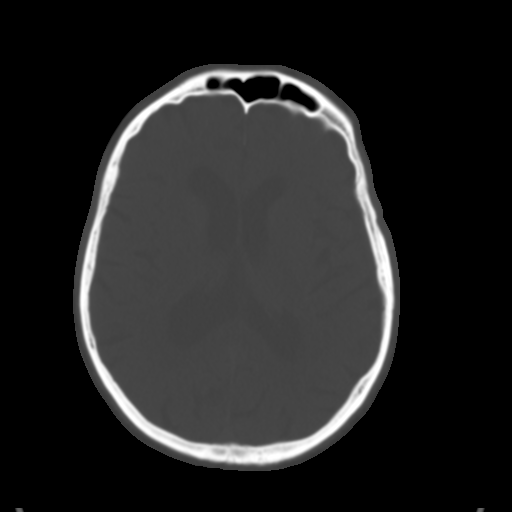
[im 23/34  brain]
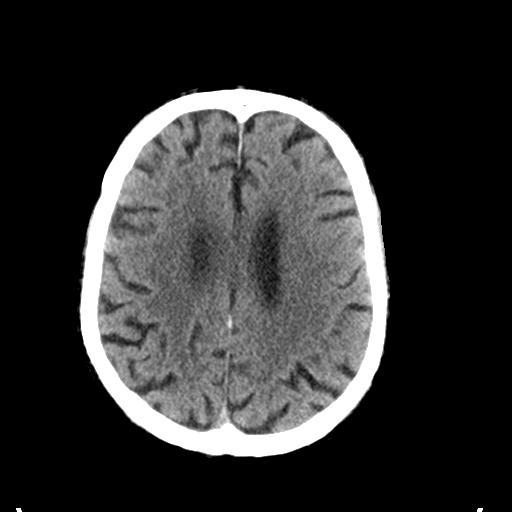
[im 27/34  brain]
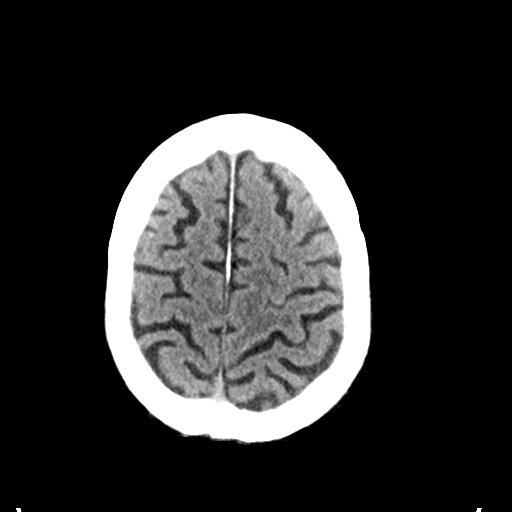
[im 31/34  brain]
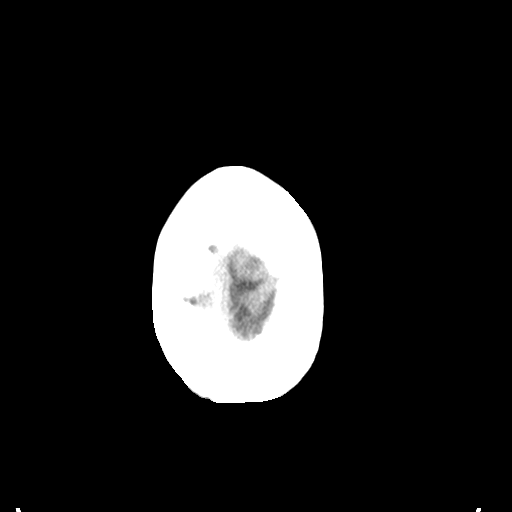

[Series 5: head 3.0 mpr cor · coronal · 0.33mm/px · 3 of 68 slices shown]
[im 23/68  brain]
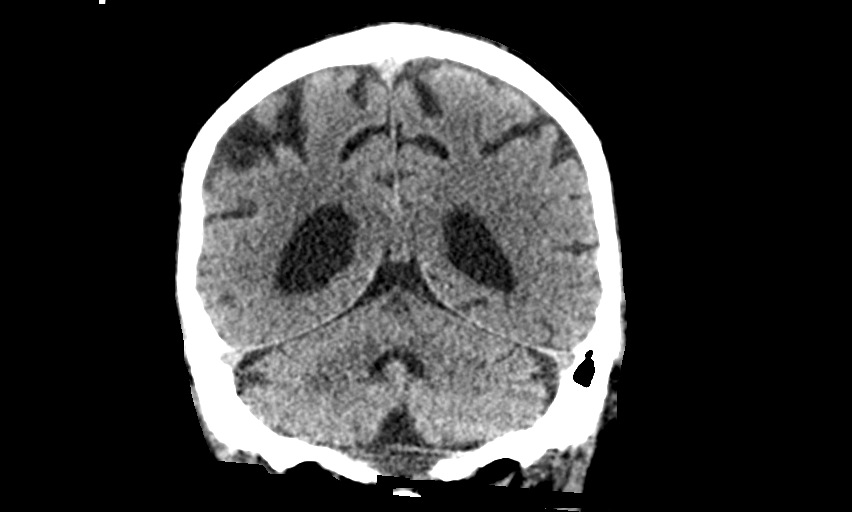
[im 30/68  brain]
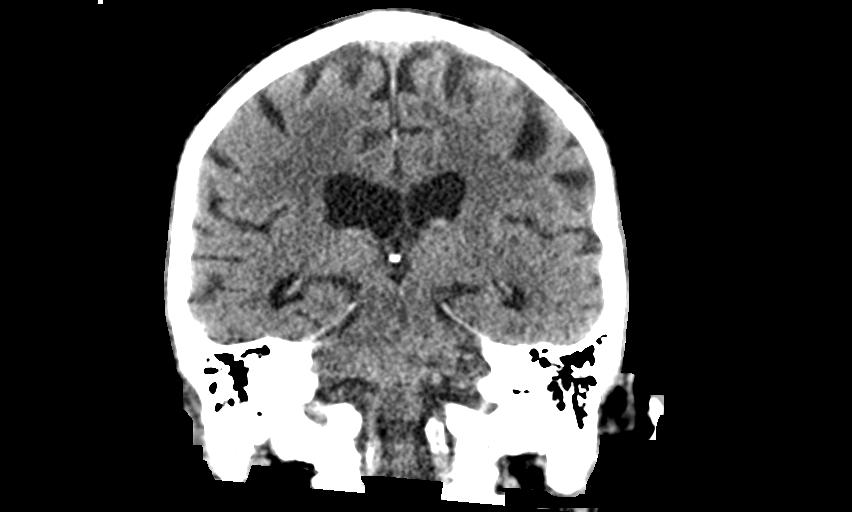
[im 38/68  brain]
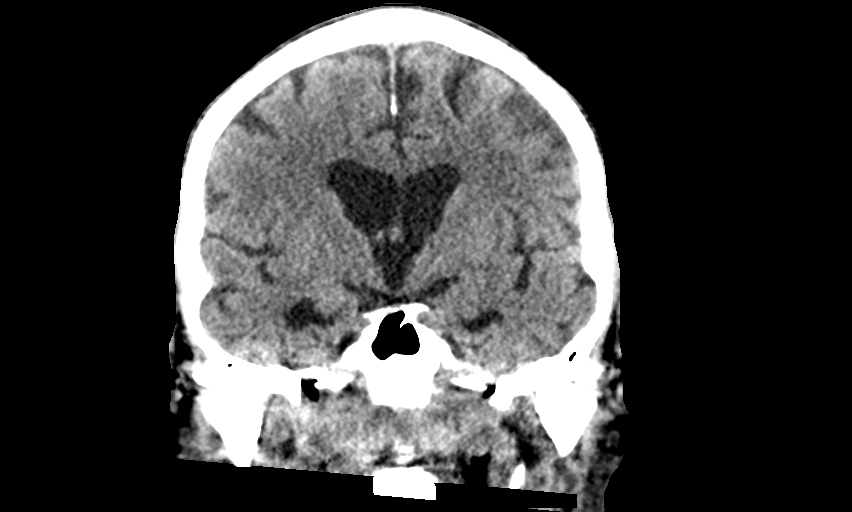

[Series 6: head 3.0 mpr sag · sagittal · 0.33mm/px · 3 of 56 slices shown]
[im 19/56  brain]
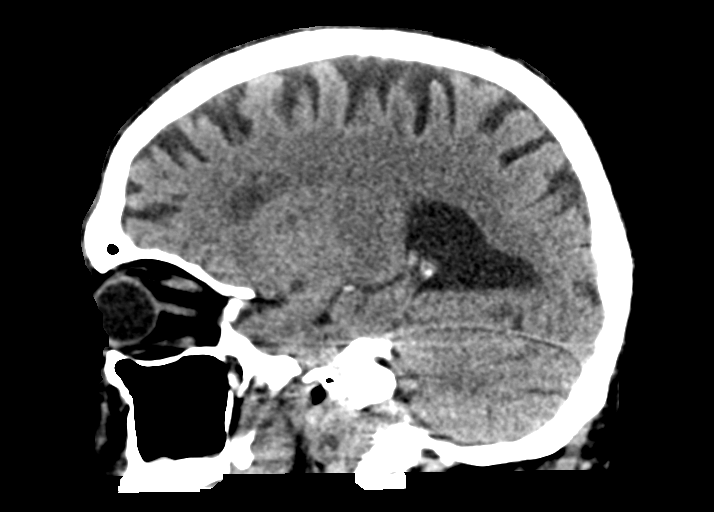
[im 28/56  brain]
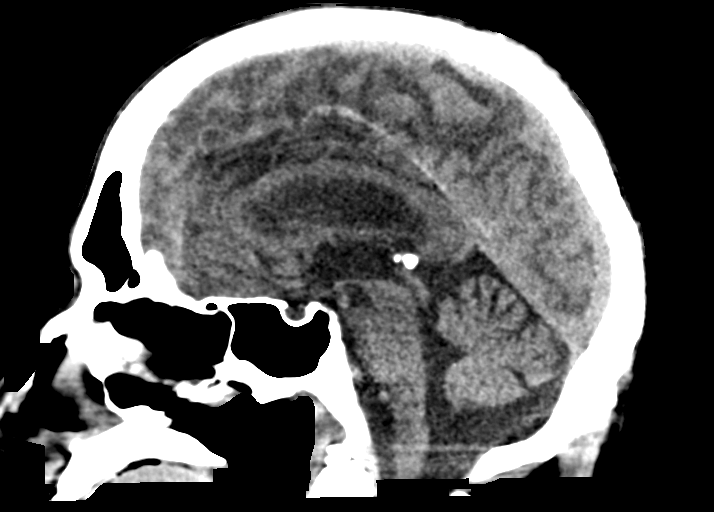
[im 37/56  brain]
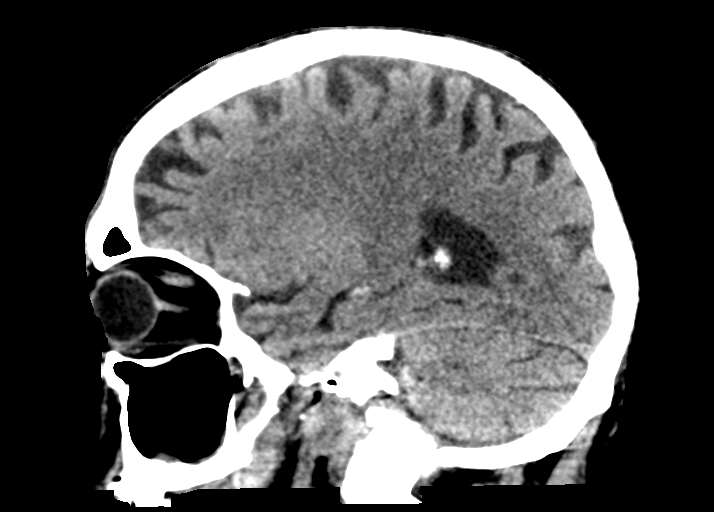

[14 of 47 positions shown; findings below may reference images not displayed]

FINDINGS: Brain: Age related atrophy. No sign of acute infarction, mass
lesion, hemorrhage, hydrocephalus or extra-axial collection.

Vascular: There is atherosclerotic calcification of the major
vessels at the base of the brain.

Skull: No evidence gross destructive change of the skull base. See
prior imaging.

Sinuses/Orbits: Mucosal thickening of the maxillary sinuses.
Persistent bilateral mastoid effusions, improved since the previous
imaging. There is no longer any fluid in the middle ears.

Other: None

ASPECTS (Alberta Stroke Program Early CT Score)

- Ganglionic level infarction (caudate, lentiform nuclei, internal
capsule, insula, M1-M3 cortex): 7

- Supraganglionic infarction (M4-M6 cortex): 3

Total score (0-10 with 10 being normal): 10
IMPRESSION: 1. No acute or focal brain finding.
2. ASPECTS is 10
3. Less fluid in the mastoid air cells. Resolution of fluid in the
middle ears.
4. These results were communicated to Dr. PECONISD at [DATE] on
[DATE] by text page via the [employer] messaging system.

## 2020-08-27 IMAGING — US US RENAL
1 series · 14 of 25 positions shown · non-contrast
Comparison: CT [DATE]

CLINICAL DATA: Acute kidney injury

EXAM:
RENAL / URINARY TRACT ULTRASOUND COMPLETE

[Series 1: us renal · 14 of 73 slices shown]
[im 1/73]
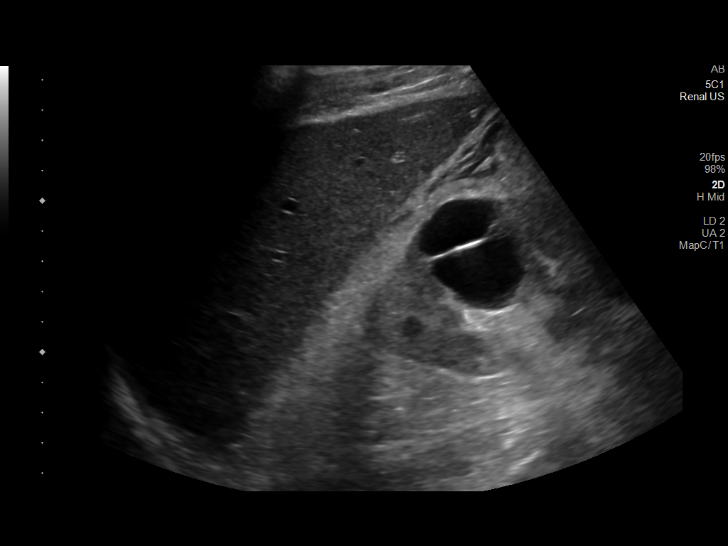
[im 7/73]
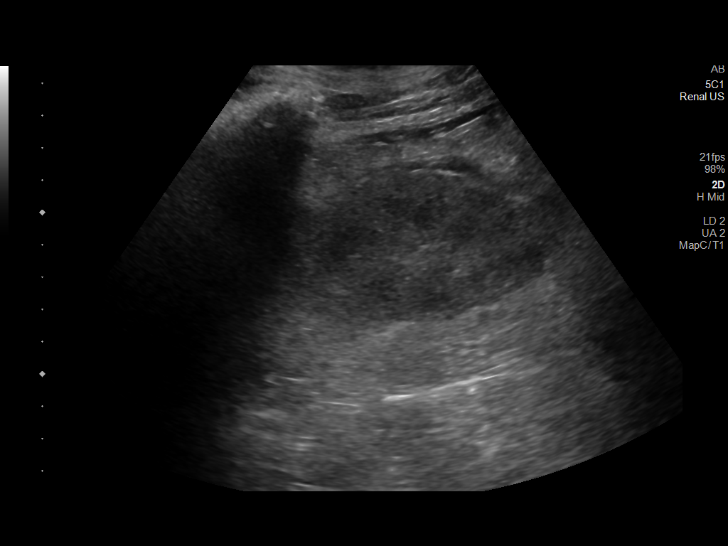
[im 13/73]
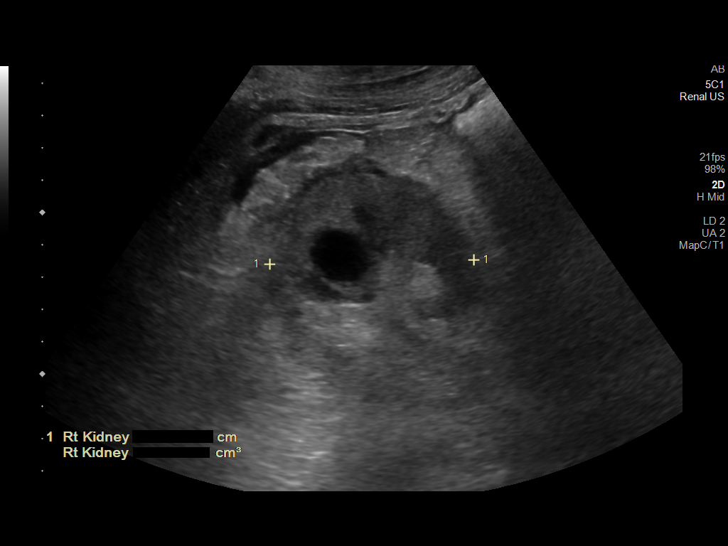
[im 19/73]
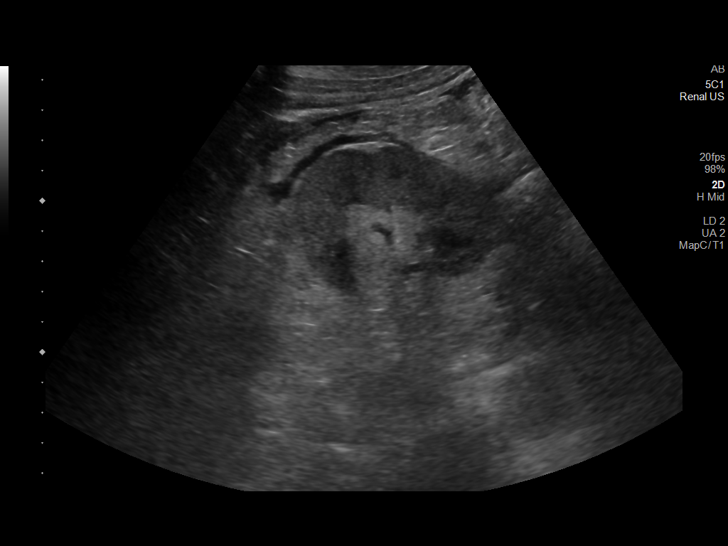
[im 25/73]
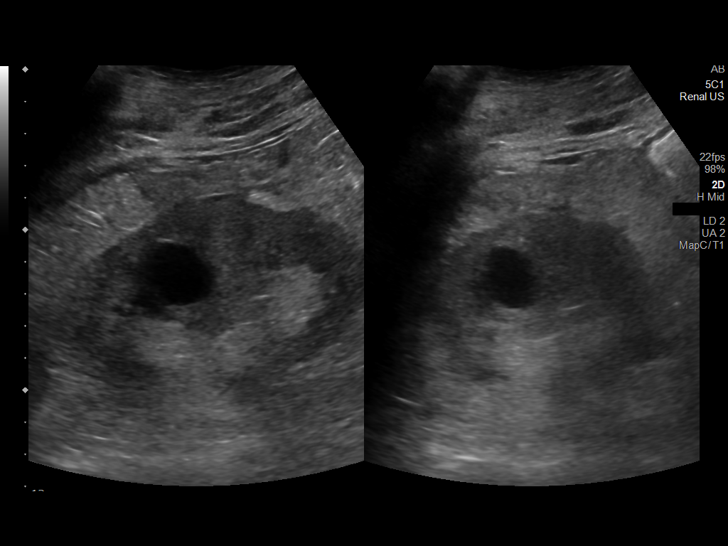
[im 28/73]
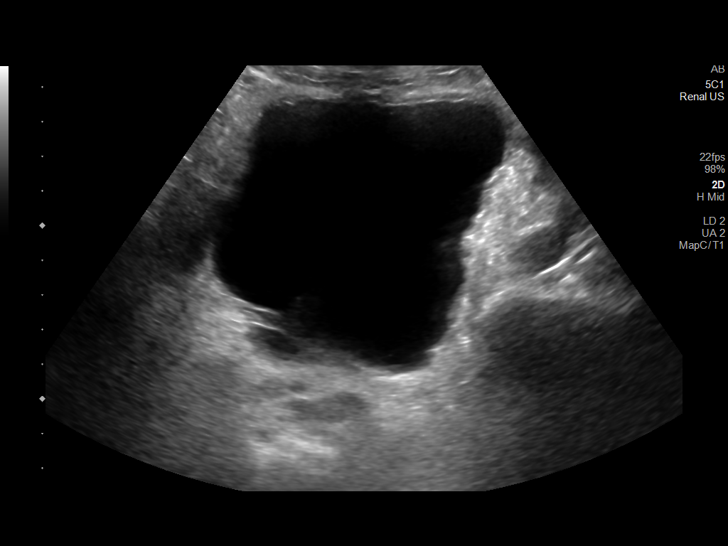
[im 34/73]
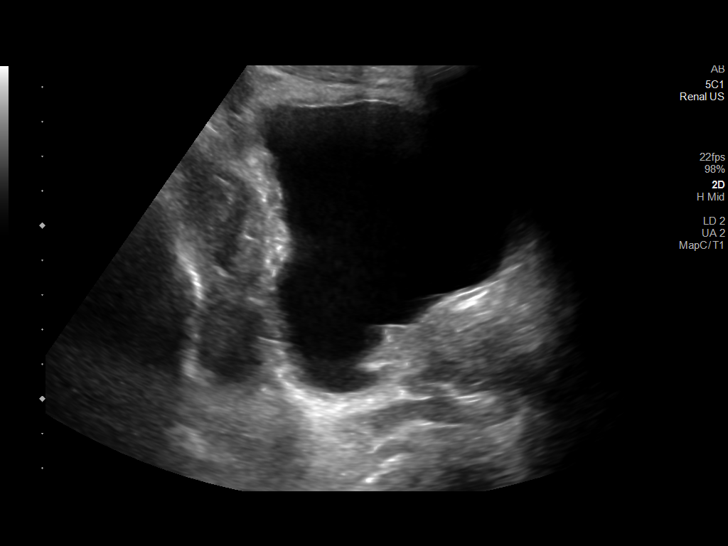
[im 40/73]
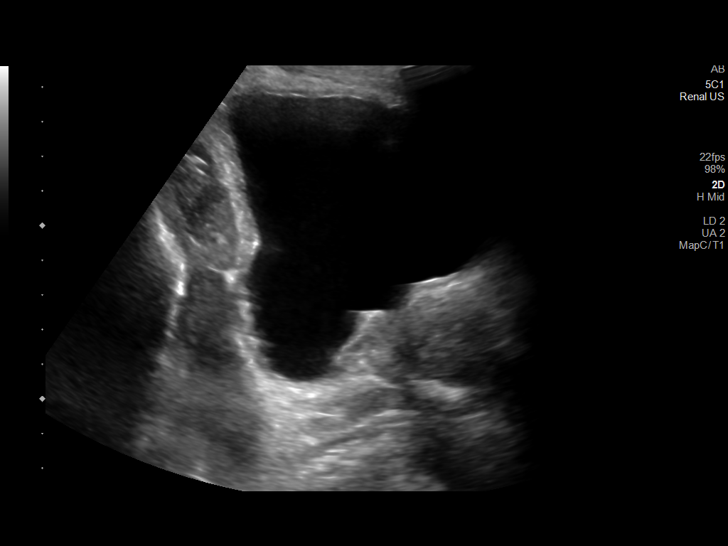
[im 46/73]
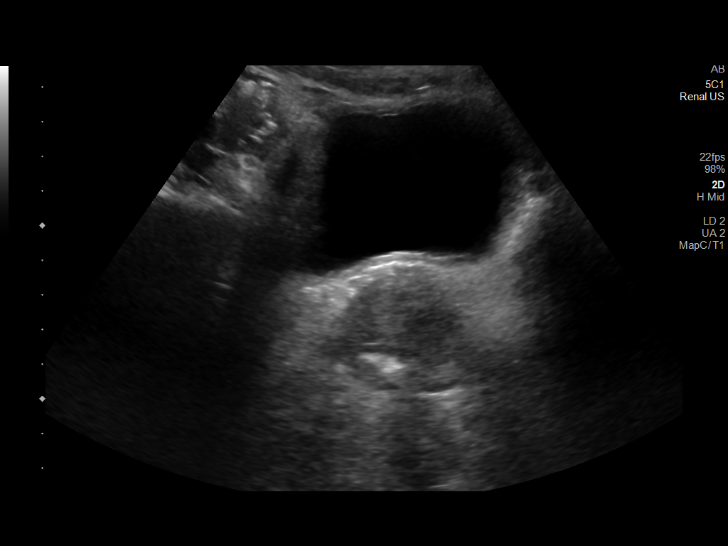
[im 49/73]
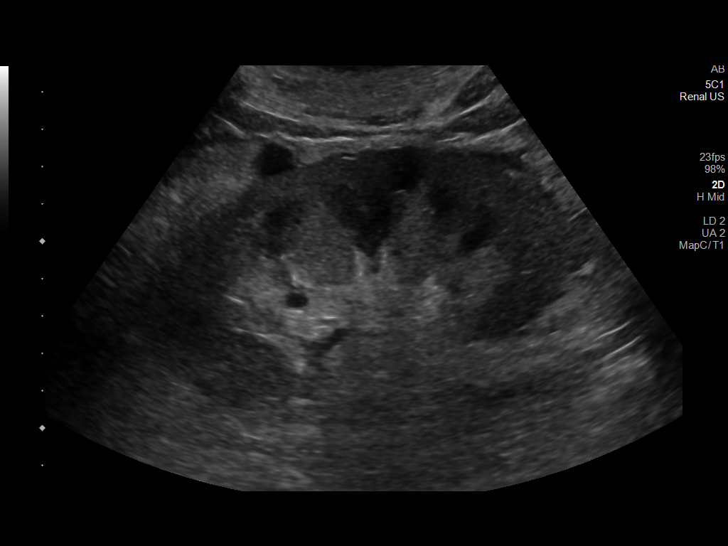
[im 55/73]
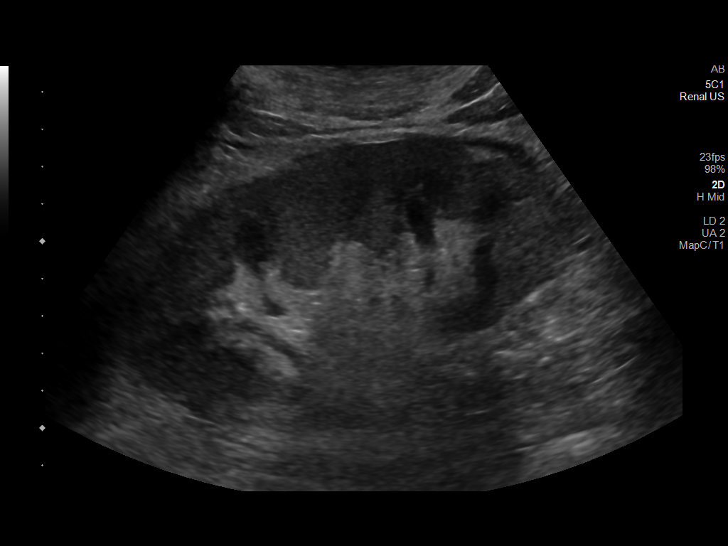
[im 61/73]
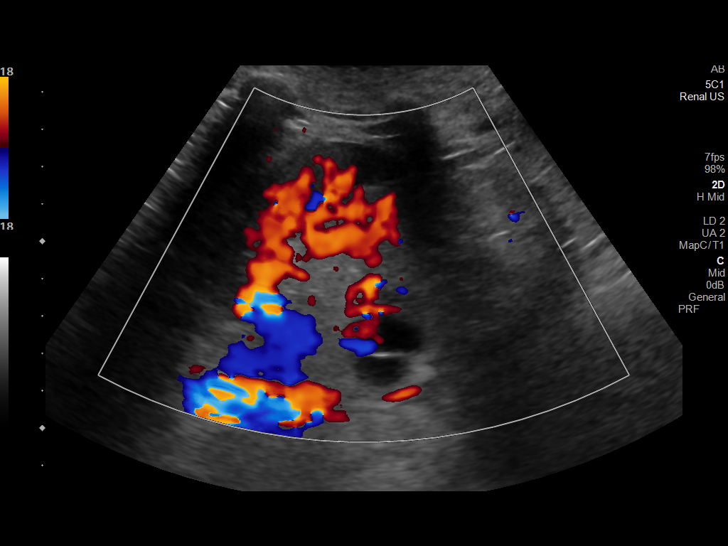
[im 67/73]
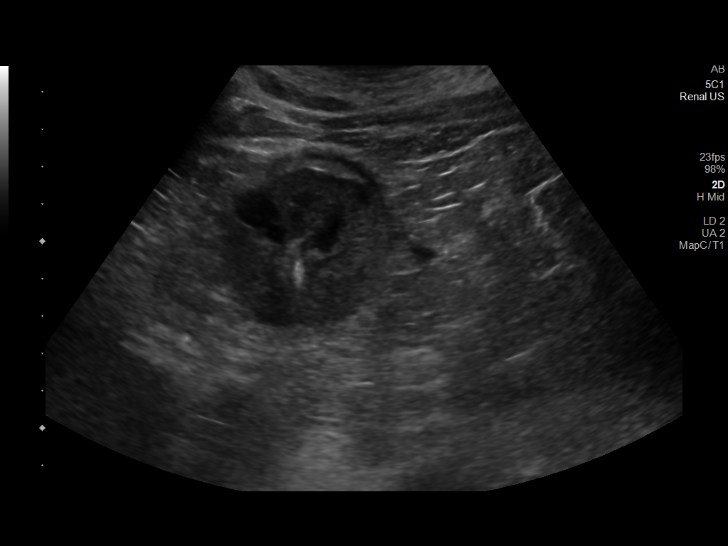
[im 73/73]
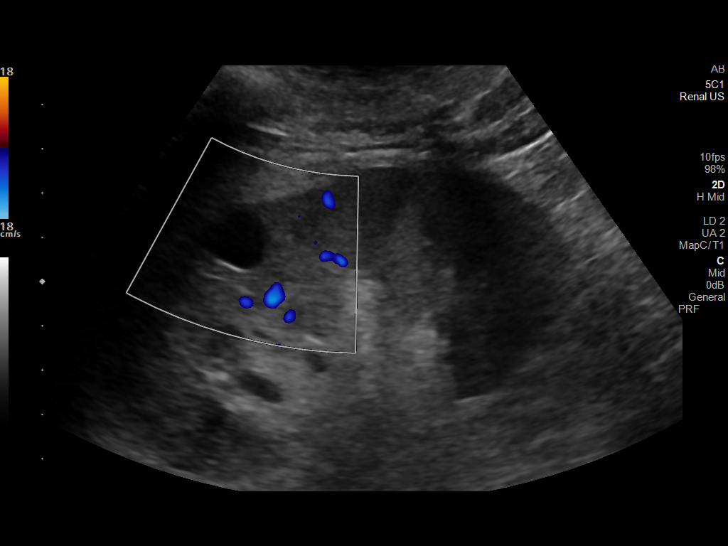

[14 of 25 positions shown; findings below may reference images not displayed]

FINDINGS: Right Kidney:

Renal measurements: 10.9 x 6.8 x 6.3 cm = volume: 245.8 mL. Cortex
is echogenic. No hydronephrosis. Trace perinephric fluid. Multiple
cysts. Septated cyst in the mid right kidney measuring 3.1 x 3.3 x
2.4 cm. Cyst in the midpole measuring 2.5 x 1.9 x 1.7 cm.

Left Kidney:

Renal measurements: 12.4 x 5.9 x 6 cm = volume: 227.3 mL. Cortex is
echogenic. No hydronephrosis. Multiple cysts, greater than 10.
Septated cyst in the midpole measuring 2.5 x 2.2 x 1.9 cm. Cyst at
the upper pole measuring 1.4 x 1.3 x 1.1 cm. Trace perinephric
fluid.

Bladder:

Appears normal for degree of bladder distention.

Other:

Enlarged prostate.
IMPRESSION: 1. Echogenic kidneys consistent with medical renal disease. No
hydronephrosis.
2. Bilateral renal cysts
3. Enlarged prostate

## 2020-08-27 IMAGING — CT CT RENAL STONE PROTOCOL
2 of 5 series · 15 of 46 positions shown, 17 images · non-contrast
Comparison: None.

CLINICAL DATA: Acute renal failure

EXAM:
CT ABDOMEN AND PELVIS WITHOUT CONTRAST
TECHNIQUE: Multidetector CT imaging of the abdomen and pelvis was performed
following the standard protocol without IV contrast.

[Series 6: coronal soft tissue · coronal · 0.99mm/px · 3 of 98 slices shown]
[im 33/98  soft-tissue]
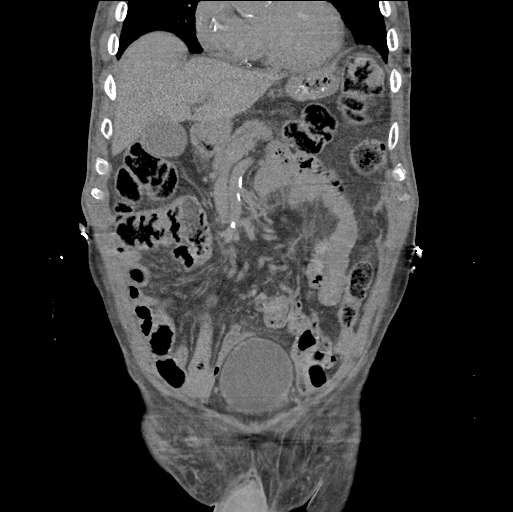
[im 44/98  soft-tissue]
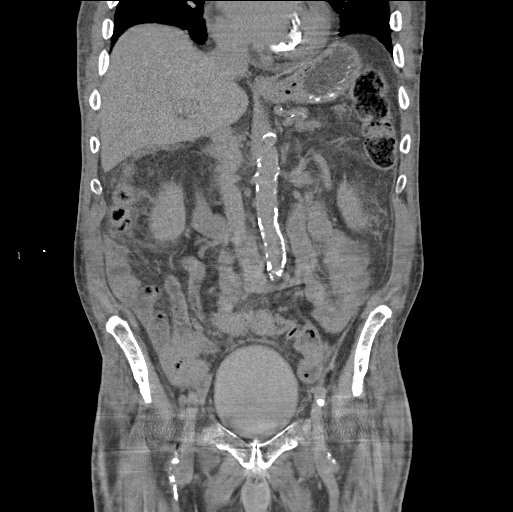
[im 54/98  soft-tissue]
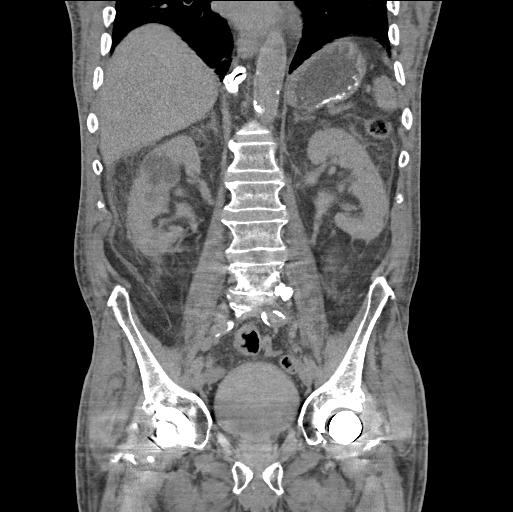

[Series 8: stone study 5.0 i30f 2 (person_name) · axial · 0.86mm/px · z∈[+838,+1268]mm · 12 of 102 slices shown, 14 images]
[im 8/102  soft-tissue]
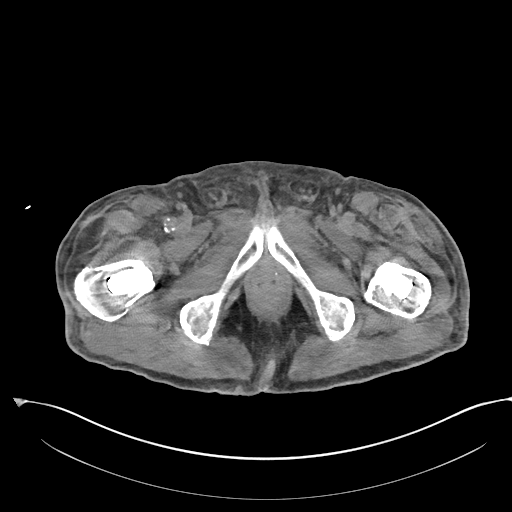
[im 8/102  bone]
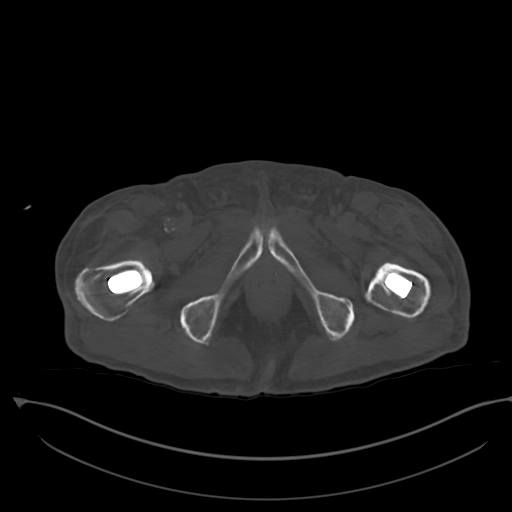
[im 16/102  soft-tissue]
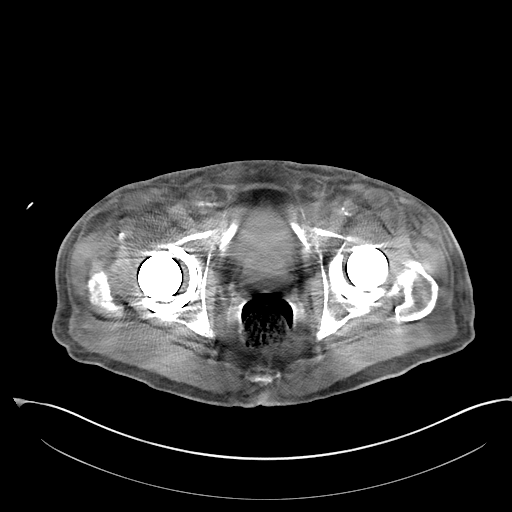
[im 24/102  soft-tissue]
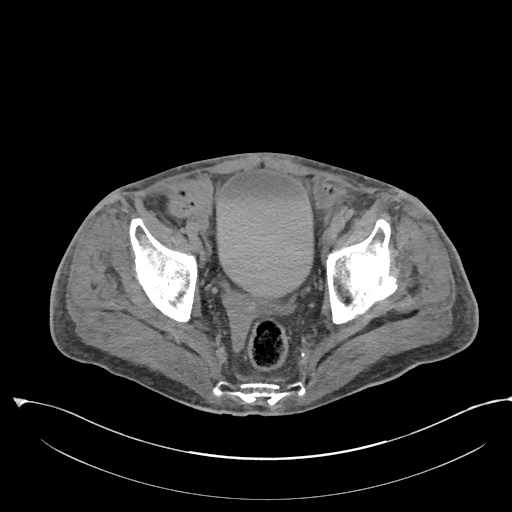
[im 32/102  soft-tissue]
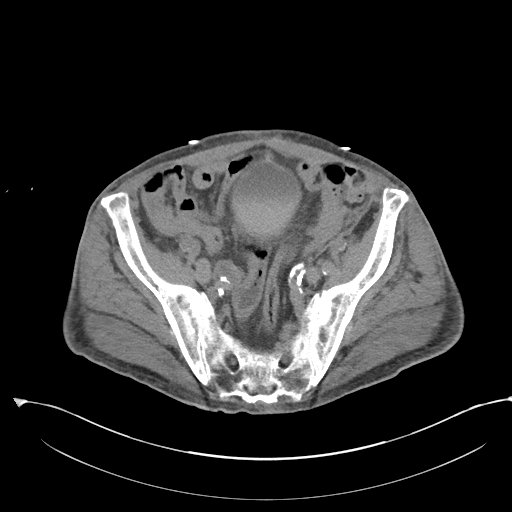
[im 39/102  soft-tissue]
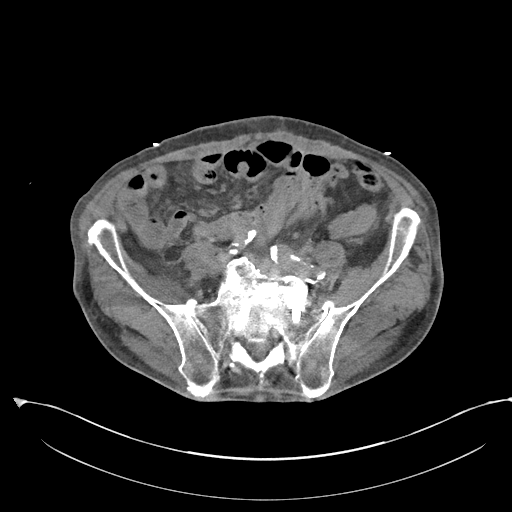
[im 47/102  soft-tissue]
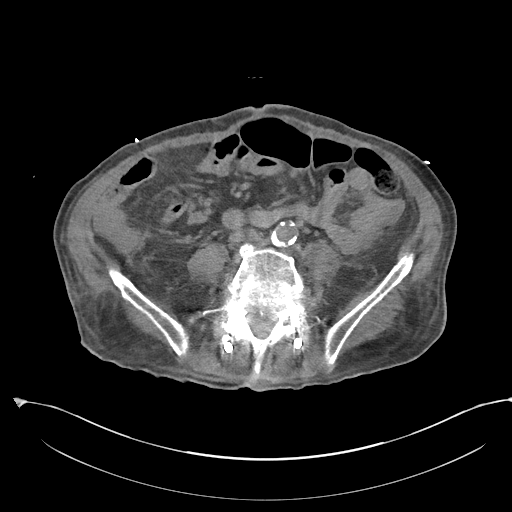
[im 55/102  soft-tissue]
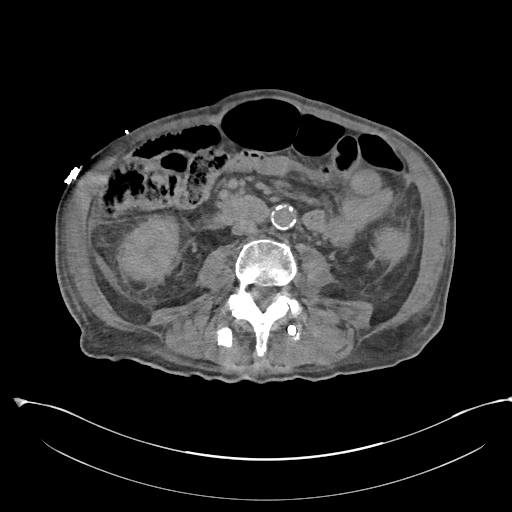
[im 63/102  soft-tissue]
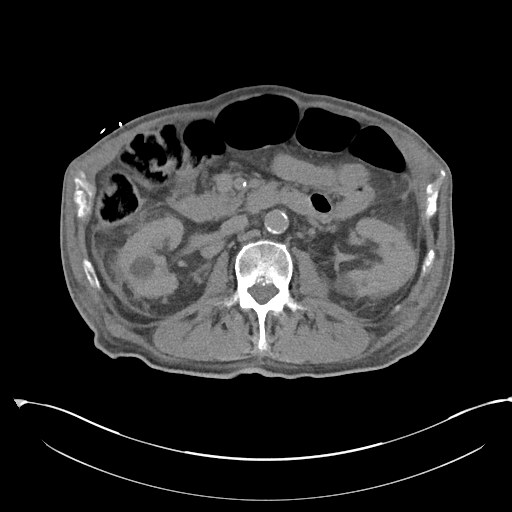
[im 70/102  soft-tissue]
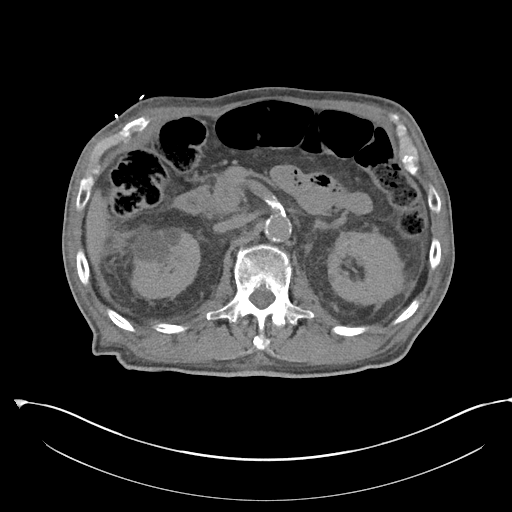
[im 70/102  bone]
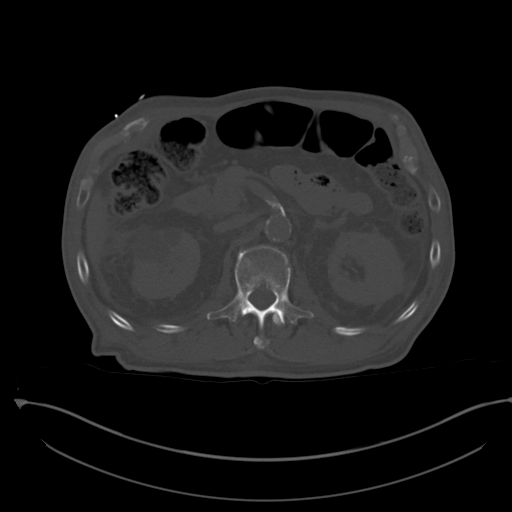
[im 78/102  soft-tissue]
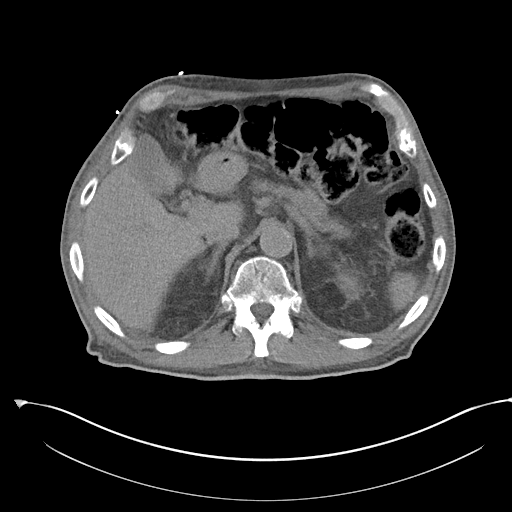
[im 86/102  soft-tissue]
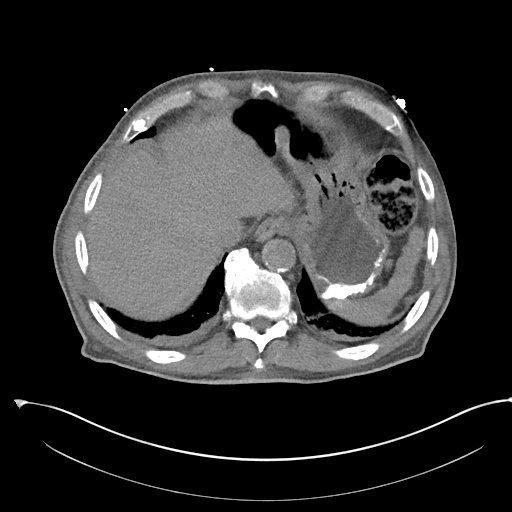
[im 94/102  soft-tissue]
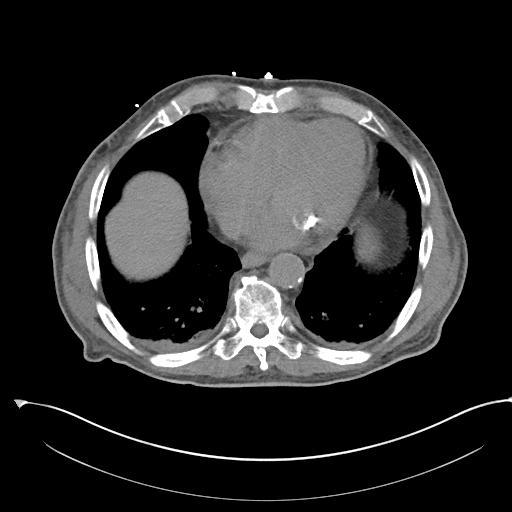

[15 of 46 positions shown; findings below may reference images not displayed]

FINDINGS: Lower chest: Trace bilateral pleural effusions with associated
bibasilar opacities, likely atelectasis. Heart size is upper limits
of normal. Trace pericardial effusion. Atherosclerotic
calcifications of the coronary arteries. Calcified right hilar lymph
nodes and right basilar granuloma.

Hepatobiliary: Unremarkable unenhanced appearance of the liver. No
focal liver lesion identified. Gallbladder within normal limits. No
hyperdense gallstone. No biliary dilatation.

Pancreas: Unremarkable. No pancreatic ductal dilatation or
surrounding inflammatory changes.

Spleen: Normal in size without focal abnormality.

Adrenals/Urinary Tract: Unremarkable adrenal glands. There are
multiple bilateral renal cysts, the largest within the upper pole of
the right kidney measuring up to 4.0 cm. Additional bilateral
subcentimeter renal lesions are too small to definitively
characterize, but most likely also represent cysts. No renal stone
or hydronephrosis. Nonspecific bilateral perinephric stranding.
There is excreted contrast within the urinary bladder. The bladder
base is obscured by artifact from patient's hip prostheses.

Stomach/Bowel: Stomach is within normal limits. Appendix not
definitively visualized. No evidence of bowel wall thickening,
distention, or inflammatory changes.

Vascular/Lymphatic: Extensive atherosclerotic calcification
throughout the aortoiliac axis. No aneurysm. No abdominopelvic
lymphadenopathy.

Reproductive: Obscured by artifact from hip prostheses.

Other: No organized abdominopelvic fluid collection. No
pneumoperitoneum. No abdominal wall hernia.

Musculoskeletal: Partially visualized bilateral hip prostheses
without apparent complication. Lumbosacral fusion hardware appears
intact. No acute osseous findings. Mild anasarca.
IMPRESSION: 1. No acute abdominopelvic findings. Specifically, no evidence of
obstructive uropathy.
2.  Nonspecific bilateral perinephric stranding.
3. Trace bilateral pleural effusions with associated bibasilar
opacities, likely atelectasis.
4. Trace pericardial effusion.
5. Bilateral renal cysts. Additional bilateral subcentimeter renal
lesions are too small to definitively characterize, but most likely
also represent cysts.

Aortic Atherosclerosis ([51]-[51]).

## 2020-08-27 IMAGING — MR MR HEAD W/O CM
12 of 13 series · 44 of 48 positions shown · non-contrast
Comparison: CT head without contrast [DATE]. CT a head and neck
and CT perfusion [DATE]. MR head without contrast [DATE]

CLINICAL DATA: Neuro deficit.  Stroke suspected.

EXAM:
MRI HEAD WITHOUT CONTRAST
TECHNIQUE: Multiplanar, multiecho pulse sequences of the brain and surrounding
structures were obtained without intravenous contrast.

[Series 5: DWI · axial · 3.0mm · 0.88mm/px · z∈[-104,+48]mm · 8 of 104 slices shown (1 of 4)]
[im 1/104]
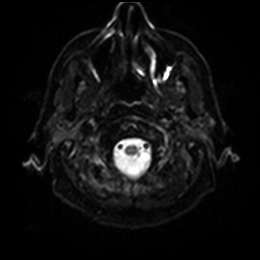
[im 15/104]
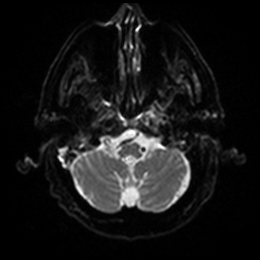
[im 30/104]
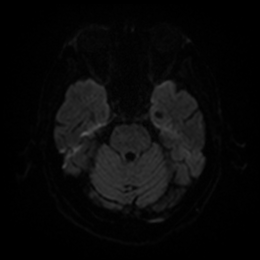
[im 45/104]
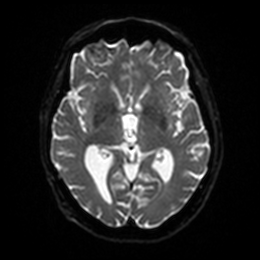
[im 59/104]
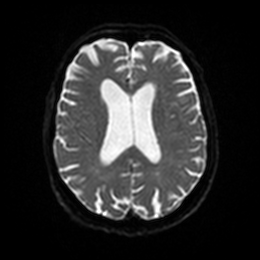
[im 74/104]
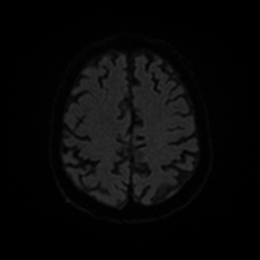
[im 89/104]
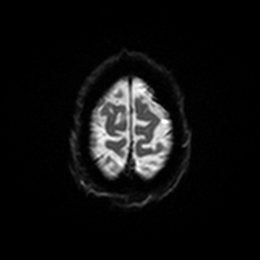
[im 104/104]
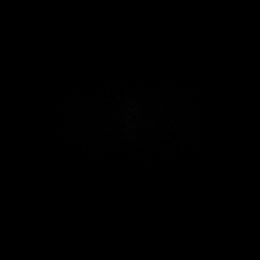

[Series 6: DWI · axial · 3.0mm · 0.88mm/px · z∈[-104,+48]mm · 4 of 52 slices shown (2 of 4)]
[im 1/52]
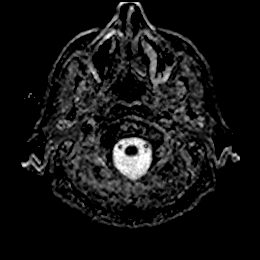
[im 18/52]
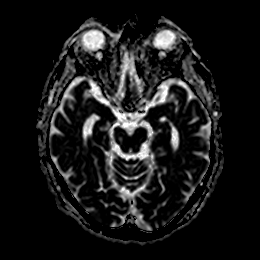
[im 35/52]
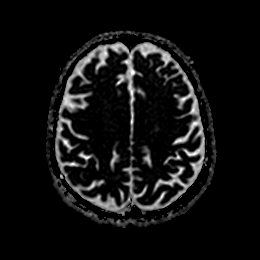
[im 52/52]
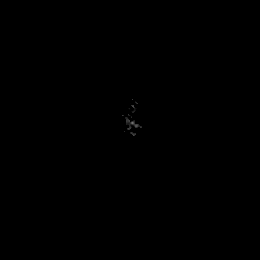

[Series 7: DWI · coronal · 4.0mm · 0.88mm/px · 5 of 72 slices shown (3 of 4)]
[im 1/72]
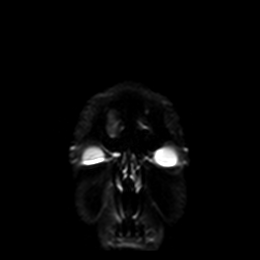
[im 18/72]
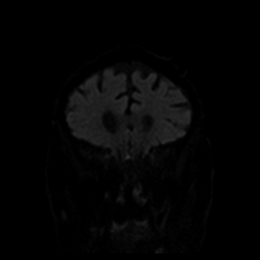
[im 36/72]
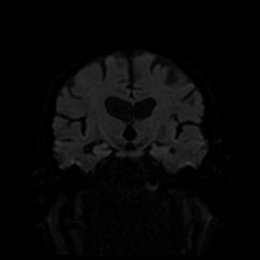
[im 54/72]
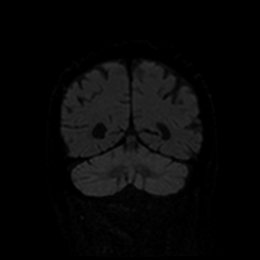
[im 72/72]
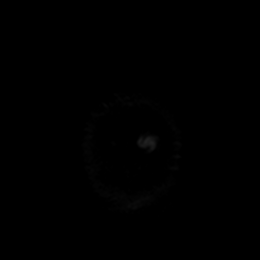

[Series 8: DWI · coronal · 4.0mm · 0.88mm/px · 3 of 36 slices shown (4 of 4)]
[im 1/36]
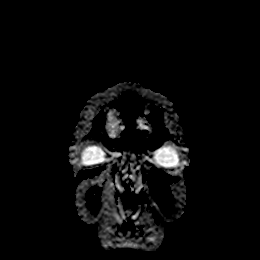
[im 18/36]
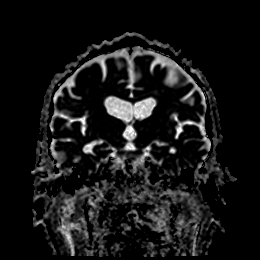
[im 36/36]
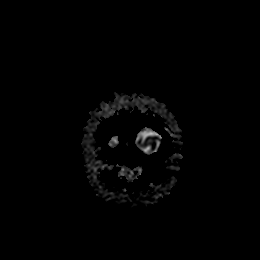

[Series 9: T1 · sagittal · 5.0mm · 0.78mm/px · 2 of 23 slices shown]
[im 1/23]
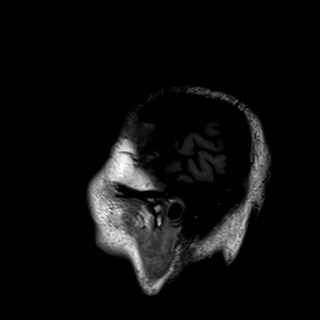
[im 23/23]
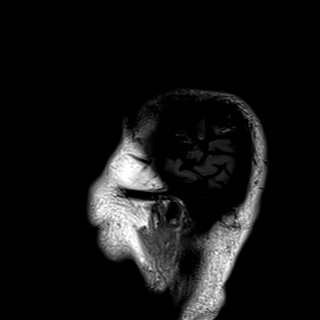

[Series 10: T2 · axial · 5.0mm · 0.78mm/px · z∈[-110,+33]mm · 2 of 25 slices shown (1 of 2)]
[im 1/25]
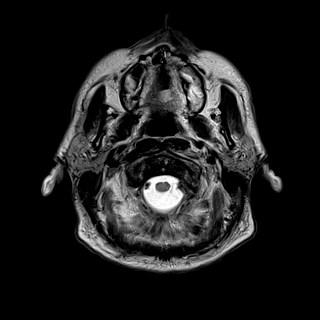
[im 25/25]
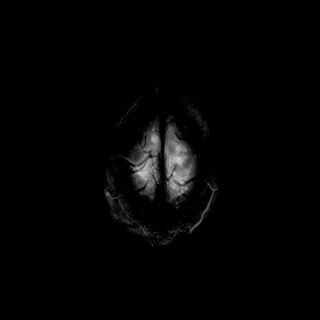

[Series 11: FLAIR · axial · 5.0mm · 0.45mm/px · z∈[-109,+34]mm · 2 of 25 slices shown]
[im 1/25]
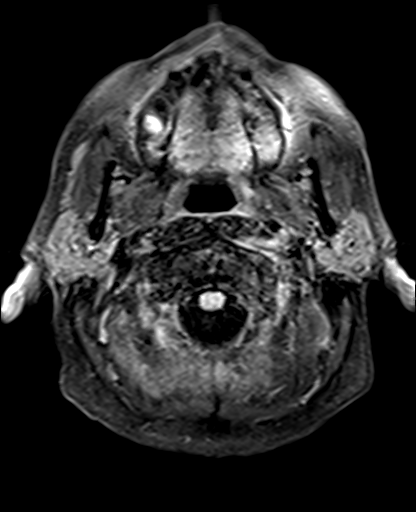
[im 25/25]
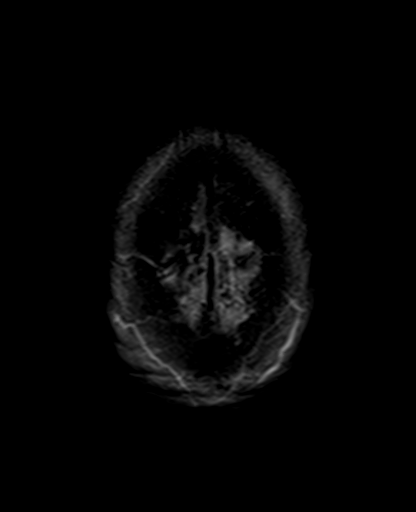

[Series 12: mag_images · axial · 3.0mm · 0.98mm/px · z∈[-117,+59]mm · 4 of 60 slices shown]
[im 1/60]
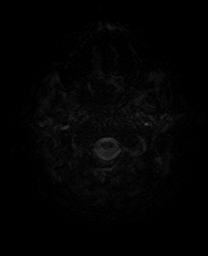
[im 20/60]
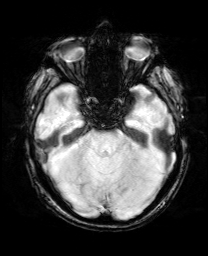
[im 40/60]
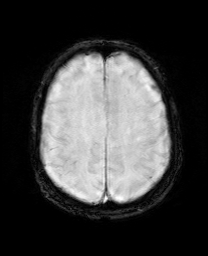
[im 60/60]
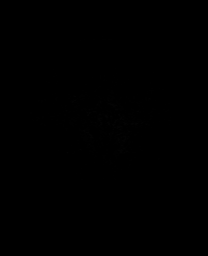

[Series 13: pha_images · axial · 3.0mm · 0.98mm/px · z∈[-117,+50]mm · 4 of 57 slices shown]
[im 1/57]
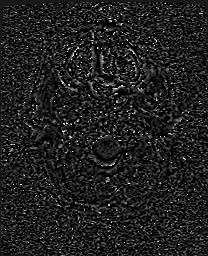
[im 19/57]
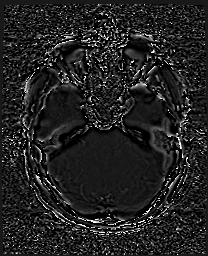
[im 38/57]
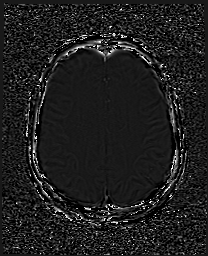
[im 57/57]
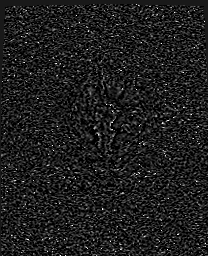

[Series 14: swi_images · axial · 3.0mm · 0.98mm/px · z∈[-117,+59]mm · 4 of 60 slices shown]
[im 1/60]
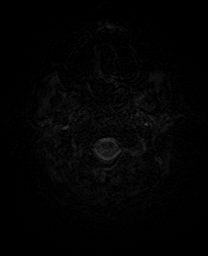
[im 20/60]
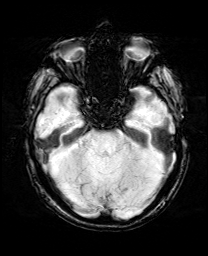
[im 40/60]
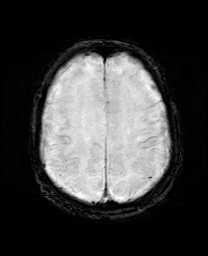
[im 60/60]
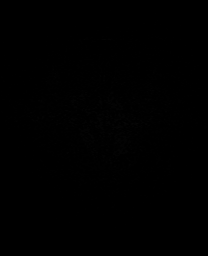

[Series 15: mip_images(sw) · axial · 24.0mm · 0.98mm/px · z∈[-106,+49]mm · 4 of 53 slices shown]
[im 1/53]
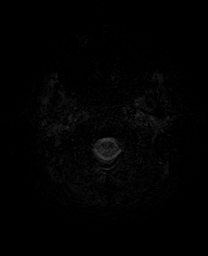
[im 18/53]
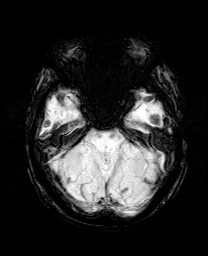
[im 35/53]
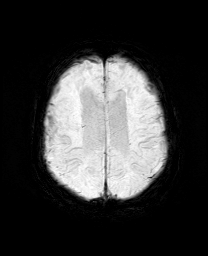
[im 53/53]
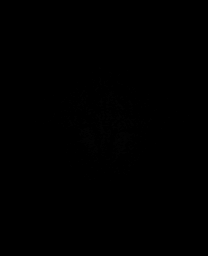

[Series 17: T2 · coronal · 5.0mm · 0.34mm/px · 2 of 29 slices shown (2 of 2)]
[im 1/29]
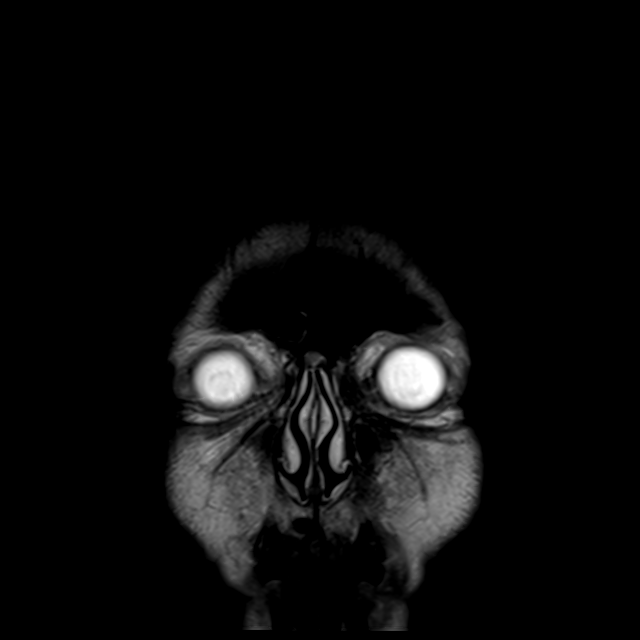
[im 29/29]
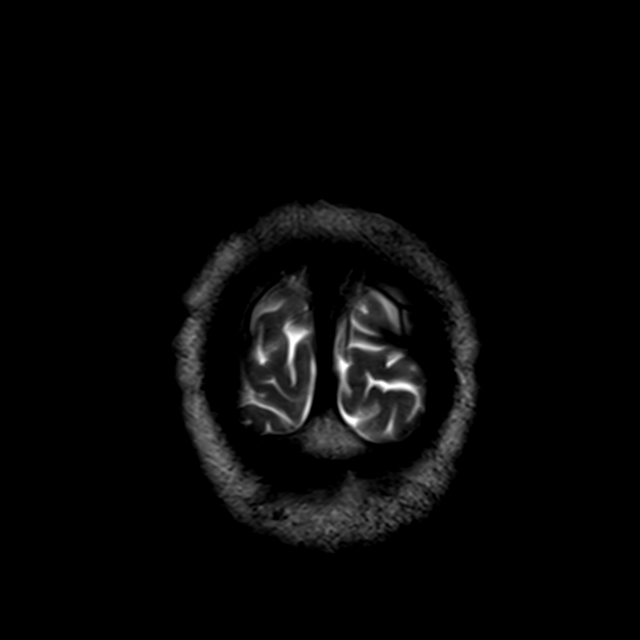

[44 of 48 positions shown; findings below may reference images not displayed]

FINDINGS: Brain: No acute infarct, hemorrhage, or mass lesion is present. The
ventricles are of normal size. Mild atrophy and minimal white matter
changes are within normal limits for age. The ventricles are of
normal size. No significant extraaxial fluid collection is present.
The internal auditory canals are within normal limits. A remote
lacunar infarct is present in the right cerebellum. Cerebellum
otherwise within normal limits.

Vascular: Flow is present in the major intracranial arteries.

Skull and upper cervical spine: Nasopharyngeal soft tissue
prominence slightly improved from prior study. Heterogeneous signal
changes again noted in the clivus and dens. Craniocervical junction
otherwise within normal limits. Midline structures otherwise
unremarkable.

Sinuses/Orbits: Bilateral mastoid effusions are again noted. No
obstructing nasopharyngeal lesion is evident. Mild mucosal
thickening is present in the floor of the maxillary sinuses
bilaterally. The paranasal sinuses and mastoid air cells are
otherwise clear. Bilateral lens replacements are noted. Globes and
orbits are otherwise unremarkable.
IMPRESSION: 1. No acute intracranial abnormality.
2. Normal MRI appearance of the brain for age.
3. Bilateral mastoid effusions.
4. Nasopharyngeal soft tissue prominence slightly improved from
prior study. Findings consistent with improving known skull base
infection.

## 2020-08-27 IMAGING — DX DG CHEST 1V PORT
1 series · 1 of 1 positions shown · non-contrast
Comparison: Portable exam [DG] hours compared to [DATE]

CLINICAL DATA: Confused, found last night holding head, LEFT facial
drooping and confusion today

EXAM:
PORTABLE CHEST 1 VIEW

[chest]
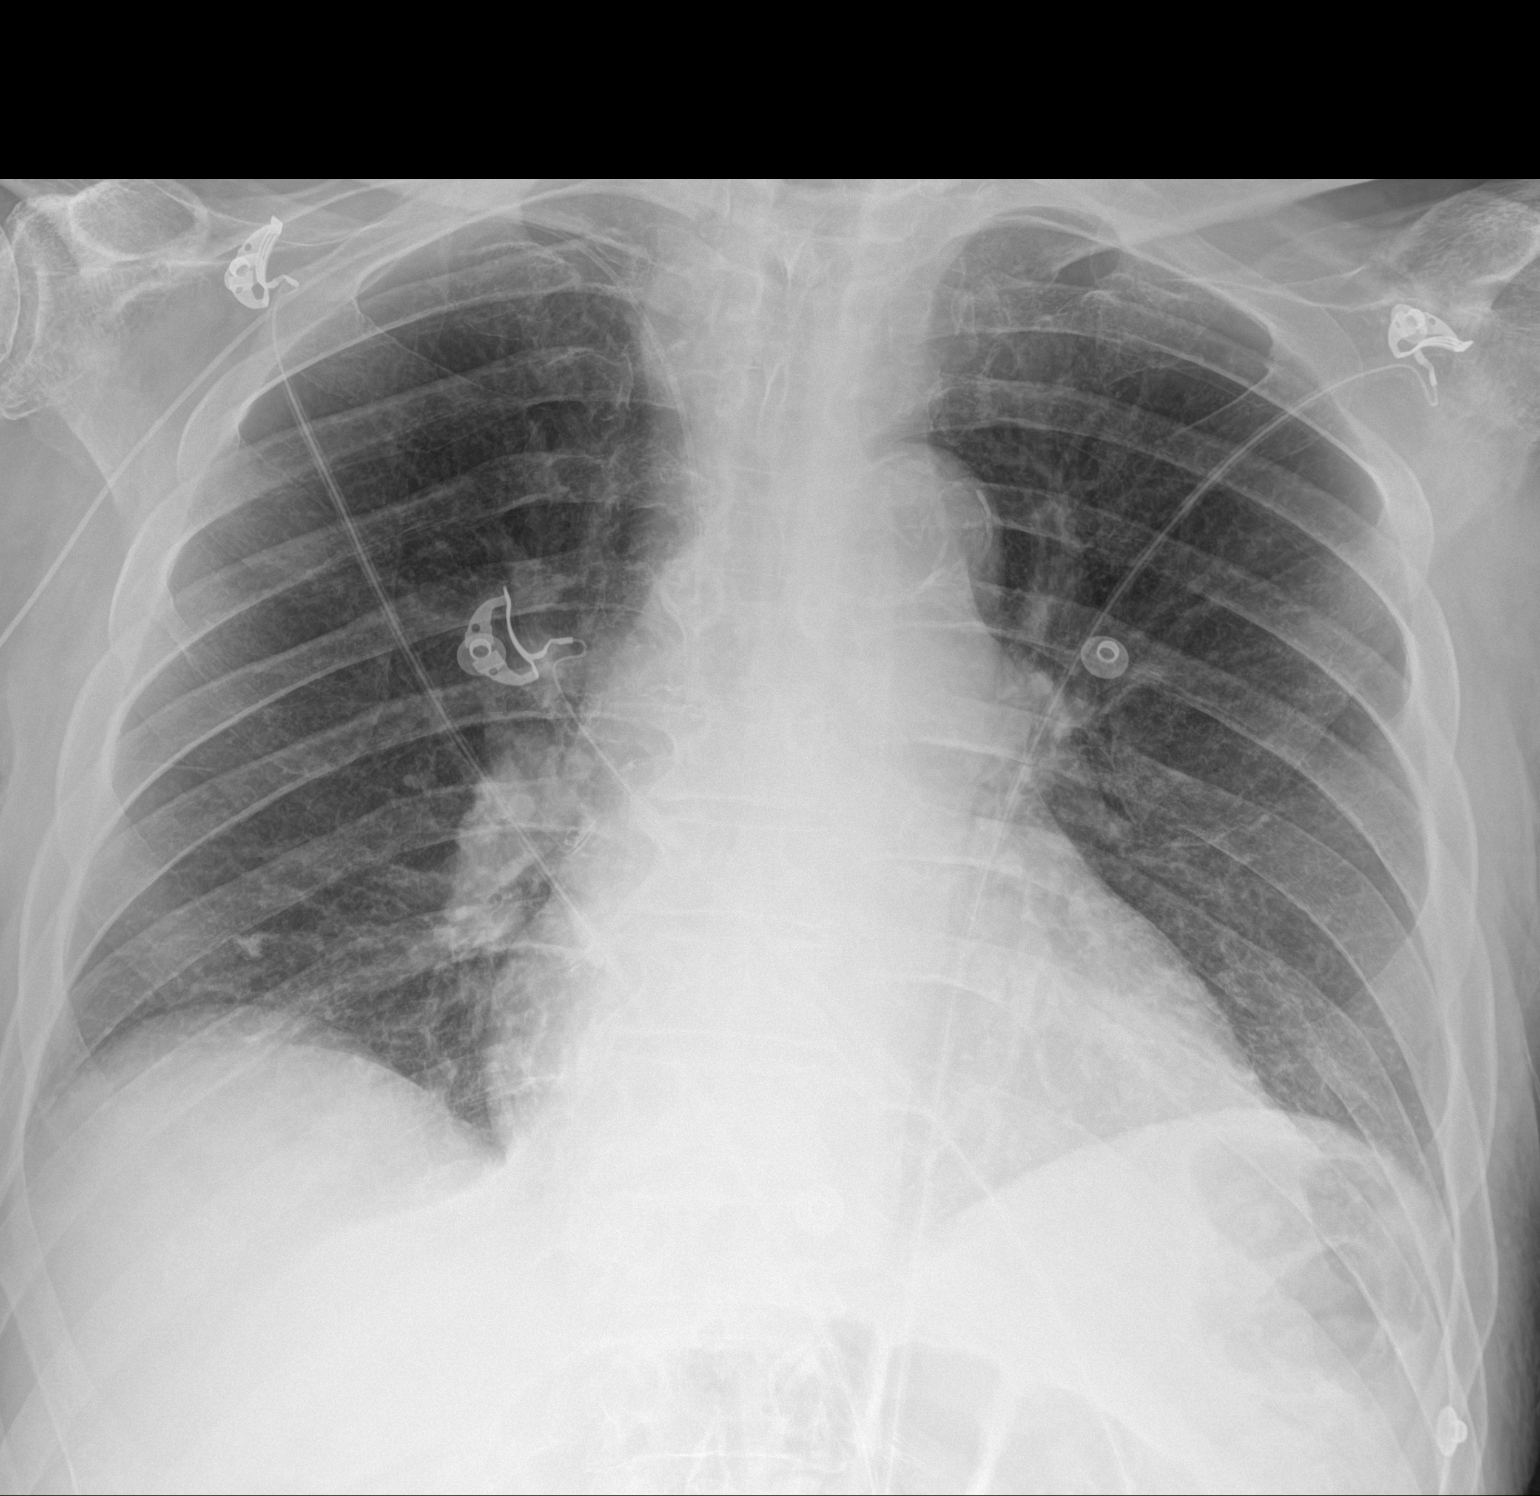

[1 of 1 positions shown; findings below may reference images not displayed]

FINDINGS: Borderline enlargement of cardiac silhouette.

Mediastinal contours and pulmonary vascularity normal.

Atherosclerotic calcification aorta.

Tip of RIGHT arm PICC line projects over SVC.

Lungs clear.

No infiltrate, pleural effusion, or pneumothorax.

Bones demineralized.
IMPRESSION: No acute abnormalities.

Aortic Atherosclerosis ([DG]-[DG]).

## 2020-08-27 MED ORDER — CLEVIDIPINE BUTYRATE 0.5 MG/ML IV EMUL
0.0000 mg/h | INTRAVENOUS | Status: DC
  Administered 2020-08-27: 1 mg/h via INTRAVENOUS
  Administered 2020-08-27: 2 mg/h via INTRAVENOUS

## 2020-08-27 MED ORDER — SODIUM CHLORIDE 0.9 % IV BOLUS
1000.0000 mL | Freq: Once | INTRAVENOUS | Status: AC
  Administered 2020-08-27: 1000 mL via INTRAVENOUS

## 2020-08-27 MED ORDER — HYDRALAZINE HCL 20 MG/ML IJ SOLN
5.0000 mg | Freq: Four times a day (QID) | INTRAMUSCULAR | Status: DC | PRN
  Administered 2020-08-27 – 2020-08-29 (×2): 5 mg via INTRAVENOUS
  Filled 2020-08-27 (×2): qty 1

## 2020-08-27 MED ORDER — INSULIN ASPART 100 UNIT/ML IJ SOLN: 0.0000 [IU] | Freq: Every day | INTRAMUSCULAR | Status: DC

## 2020-08-27 MED ORDER — SODIUM CHLORIDE 0.9 % IV SOLN: 2.0000 g | Freq: Three times a day (TID) | INTRAVENOUS | Status: DC

## 2020-08-27 MED ORDER — ACETAMINOPHEN 650 MG RE SUPP: 650.0000 mg | Freq: Four times a day (QID) | RECTAL | Status: DC | PRN

## 2020-08-27 MED ORDER — SODIUM ZIRCONIUM CYCLOSILICATE 10 G PO PACK
10.0000 g | PACK | Freq: Once | ORAL | Status: AC
  Administered 2020-08-27: 10 g via ORAL
  Filled 2020-08-27: qty 1

## 2020-08-27 MED ORDER — CLEVIDIPINE BUTYRATE 0.5 MG/ML IV EMUL: 1.0000 mg/h | INTRAVENOUS | Status: DC

## 2020-08-27 MED ORDER — IOHEXOL 350 MG/ML SOLN
115.0000 mL | Freq: Once | INTRAVENOUS | Status: AC | PRN
  Administered 2020-08-27: 115 mL via INTRAVENOUS

## 2020-08-27 MED ORDER — CLEVIDIPINE BUTYRATE 0.5 MG/ML IV EMUL
0.0000 mg/h | INTRAVENOUS | Status: DC
  Filled 2020-08-27: qty 50

## 2020-08-27 MED ORDER — HEPARIN (PORCINE) 25000 UT/250ML-% IV SOLN
1300.0000 [IU]/h | INTRAVENOUS | Status: DC
  Administered 2020-08-27: 1300 [IU]/h via INTRAVENOUS
  Filled 2020-08-27 (×2): qty 250

## 2020-08-27 MED ORDER — FUROSEMIDE 10 MG/ML IJ SOLN
40.0000 mg | Freq: Once | INTRAMUSCULAR | Status: AC
  Administered 2020-08-27: 40 mg via INTRAVENOUS
  Filled 2020-08-27: qty 4

## 2020-08-27 MED ORDER — ACETAMINOPHEN 325 MG PO TABS: 650.0000 mg | ORAL_TABLET | Freq: Four times a day (QID) | ORAL | Status: DC | PRN

## 2020-08-27 MED ORDER — NITROGLYCERIN 2 % TD OINT: 1.0000 [in_us] | TOPICAL_OINTMENT | Freq: Once | TRANSDERMAL | Status: DC

## 2020-08-27 MED ORDER — SODIUM CHLORIDE 0.9 % IV SOLN
2.0000 g | INTRAVENOUS | Status: DC
  Administered 2020-08-28: 2 g via INTRAVENOUS
  Filled 2020-08-27: qty 2

## 2020-08-27 MED ORDER — SODIUM CHLORIDE 0.9 % IV SOLN: INTRAVENOUS | Status: AC

## 2020-08-27 MED ORDER — INSULIN ASPART 100 UNIT/ML IV SOLN
10.0000 [IU] | Freq: Once | INTRAVENOUS | Status: AC
  Administered 2020-08-27: 10 [IU] via INTRAVENOUS

## 2020-08-27 MED ORDER — CALCIUM GLUCONATE-NACL 1-0.675 GM/50ML-% IV SOLN
1.0000 g | Freq: Once | INTRAVENOUS | Status: AC
  Administered 2020-08-27: 1000 mg via INTRAVENOUS
  Filled 2020-08-27: qty 50

## 2020-08-27 MED ORDER — DEXTROSE 50 % IV SOLN
1.0000 | Freq: Once | INTRAVENOUS | Status: AC
  Administered 2020-08-27: 50 mL via INTRAVENOUS
  Filled 2020-08-27: qty 50

## 2020-08-27 MED ORDER — INSULIN ASPART 100 UNIT/ML IJ SOLN: 0.0000 [IU] | Freq: Three times a day (TID) | INTRAMUSCULAR | Status: DC

## 2020-08-27 MED ORDER — HEPARIN BOLUS VIA INFUSION
3000.0000 [IU] | Freq: Once | INTRAVENOUS | Status: AC
  Administered 2020-08-27: 3000 [IU] via INTRAVENOUS
  Filled 2020-08-27: qty 3000

## 2020-08-27 MED ORDER — AMLODIPINE BESYLATE 5 MG PO TABS
5.0000 mg | ORAL_TABLET | Freq: Every day | ORAL | Status: DC
  Filled 2020-08-27 (×3): qty 1

## 2020-08-27 MED ORDER — ALBUTEROL SULFATE (2.5 MG/3ML) 0.083% IN NEBU
5.0000 mg | INHALATION_SOLUTION | Freq: Once | RESPIRATORY_TRACT | Status: DC
  Filled 2020-08-27: qty 6

## 2020-08-27 NOTE — ED Notes (Signed)
Pt transported to CT ?

## 2020-08-27 NOTE — ED Triage Notes (Signed)
Pt BIB [facility] from home. LKW 1030 pm last night (08/28/20). Family reports found him sitting on side of bed holding his head. EMS reports pt has recently been on an ABT for infection in the "C1-C3" location (per family). EMS also reports pt has recent confusion & family utilizing written communication with him. Is on Eliquis, Left facial droop upon arrival & hearing aid was left at home.

## 2020-08-27 NOTE — Consult Note (Addendum)
[facility]  HISTORY AND PHYSICAL  Nicolas James is an 87 y.o. male.    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 Artis Flock for eval and recs re: AKI.  Pt's history is obtained via chart review and history provided by his dtr.    Briefly, pt started having bilateral ear issues since August 2021- was living in [place] and was placed on multiple courses of antibiotics really without relief.  He was not doing well by himself in [place] and so was brought to [place] to be closer to family.  Started following with ENT April 2022--> ear culture 05/30/20 with fluoroquinolone- resistant Pseudomonas aeruginosa.  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    Admitted 5/30-06/24/20 for headache and n/v.  Was found to have skull base osteomyelitis along with C1-C2 osteo as well.  He was placed on cefepime 2 g IV q 8 hrs which he has been receiving at home.  He was initially doing well with this but started developing n/v and confusion since 08/24/20.  Dtr says that he vomited 2x each day but was able to overall keep down at least 32-64 oz water daily.  No NSAIDs.  No known hypotensive events.  Did receive contrast today.  He was brought to the ED where he was found to have a Cr of 4.76 and K of 7.3. He received treatment for hyperkalemia with improvement to 3.8. He is still urinating.  Last Cr known was from [facility] labs- Cr 0.79 07/04/20.  He was seen by ID 08/09/20 where it was noted that his imaging was improving but not resolved so antibiotics were extended to end date of 09/10/20.  He is taking metformin and lisinopril 10 mg daily.     PMH: Past Medical History:  Diagnosis Date   Diabetes mellitus without complication (HCC)    Hypertension    Thyroid disease    PSH: Past Surgical  History:  Procedure Laterality Date   JOINT REPLACEMENT Bilateral    Hips and Knees    Past Medical History:  Diagnosis Date   Diabetes mellitus without complication (HCC)    Hypertension    Thyroid disease     Medications:  Prior to Admission: I have reviewed them and include:  Cefepime 2 g IV q 8 hrs Metformin 850 mg BID Lisinopril 10 mg daily Eliquis 5 mg BID Detemir 7 u QHS  ALLERGIES:   Allergies  Allergen Reactions   Tape Other (See Comments)    STICKY TAPE PEELS SKIN OFF     FAM HX: History reviewed. No pertinent family history.  Social History:   reports that he has never smoked. He has never used smokeless tobacco. He reports that he does not drink alcohol and does not use drugs.  ROS: ROS: unobtainable d/t confusion and HOH  Blood pressure (!) 164/78, pulse (!) 56, temperature (!) 97.5 F (36.4 C), temperature source Oral, resp. rate 12, SpO2 99 %. PHYSICAL EXAM: Physical Exam GEN confused, moaning HEENT head under blankets, resists my attempts to pull them off, HOH NECK no JVD PULM clear CV RRR on monitor ABD soft EXT trace mid-shin edema which dtr says is baseline NEURO moaning, clutching head, confused SKIN no visible rashes   Results for orders placed  or performed during the hospital encounter of 08/27/20 (from the past 48 hour(s))  Resp Panel by RT-PCR (Flu A&B, Covid) Nasopharyngeal Swab     Status: None   Collection Time: 08/27/20 10:18 AM   Specimen: Nasopharyngeal Swab; Nasopharyngeal(NP) swabs in vial transport medium  Result Value Ref Range   SARS Coronavirus 2 by RT PCR NEGATIVE NEGATIVE    Comment: (NOTE) SARS-CoV-2 target nucleic acids are NOT DETECTED.  The SARS-CoV-2 RNA is generally detectable in upper respiratory specimens during the acute phase of infection. The lowest concentration of SARS-CoV-2 viral copies this assay can detect is 138 copies/mL. A negative result does not preclude SARS-Cov-2 infection and should not be  used as the sole basis for treatment or other patient management decisions. A negative result may occur with  improper specimen collection/handling, submission of specimen other than nasopharyngeal swab, presence of viral mutation(s) within the areas targeted by this assay, and inadequate number of viral copies(<138 copies/mL). A negative result must be combined with clinical observations, patient history, and epidemiological information. The expected result is Negative.  Fact Sheet for Patients:  BloggerCourse.com  Fact Sheet for Healthcare Providers:  SeriousBroker.it  This test is no t yet approved or cleared by the [place] FDA and  has been authorized for detection and/or diagnosis of SARS-CoV-2 by FDA under an Emergency Use Authorization (EUA). This EUA will remain  in effect (meaning this test can be used) for the duration of the COVID-19 declaration under Section 564(b)(1) of the Act, 21 U.S.C.section 360bbb-3(b)(1), unless the authorization is terminated  or revoked sooner.       Influenza A by PCR NEGATIVE NEGATIVE   Influenza B by PCR NEGATIVE NEGATIVE    Comment: (NOTE) The Xpert Xpress SARS-CoV-2/FLU/RSV plus assay is intended as an aid in the diagnosis of influenza from Nasopharyngeal swab specimens and should not be used as a sole basis for treatment. Nasal washings and aspirates are unacceptable for Xpert Xpress SARS-CoV-2/FLU/RSV testing.  Fact Sheet for Patients: BloggerCourse.com  Fact Sheet for Healthcare Providers: SeriousBroker.it  This test is not yet approved or cleared by the [place] FDA and has been authorized for detection and/or diagnosis of SARS-CoV-2 by FDA under an Emergency Use Authorization (EUA). This EUA will remain in effect (meaning this test can be used) for the duration of the COVID-19 declaration under Section 564(b)(1) of the  Act, 21 U.S.C. section 360bbb-3(b)(1), unless the authorization is terminated or revoked.  Performed at [facility], [address], [place], [place] [address]   Ethanol     Status: None   Collection Time: 08/27/20 10:18 AM  Result Value Ref Range   Alcohol, Ethyl (B) <10 <10 mg/dL    Comment: (NOTE) Lowest detectable limit for serum alcohol is 10 mg/dL.  For medical purposes only. Performed at [facility], [address], [place], [place] [address]   Protime-INR     Status: Abnormal   Collection Time: 08/27/20 10:18 AM  Result Value Ref Range   Prothrombin Time 16.5 (H) 11.4 - 15.2 seconds    Comment: HEMOLYSIS AT THIS LEVEL MAY AFFECT RESULT   INR 1.3 (H) 0.8 - 1.2    Comment: (NOTE) INR goal varies based on device and disease states. Performed at [facility], [address], [place], [place] [address]   APTT     Status: None   Collection Time: 08/27/20 10:18 AM  Result Value Ref Range   aPTT 32 24 - 36  seconds    Comment: Performed at [facility], [address], [place], [place] [address]  CBC     Status: Abnormal   Collection Time: 08/27/20 10:18 AM  Result Value Ref Range   WBC 6.7 4.0 - 10.5 K/uL   RBC 4.04 (L) 4.22 - 5.81 MIL/uL   Hemoglobin 11.9 (L) 13.0 - 17.0 g/dL   HCT 19.1 (L) 47.8 - 29.5 %   MCV 85.6 80.0 - 100.0 fL   MCH 29.5 26.0 - 34.0 pg   MCHC 34.4 30.0 - 36.0 g/dL   RDW 62.1 30.8 - 65.7 %   Platelets 218 150 - 400 K/uL   nRBC 0.0 0.0 - 0.2 %    Comment: Performed at [facility], [address], [place], [place] [address]  Differential     Status: None   Collection Time: 08/27/20 10:18 AM  Result Value Ref Range   Neutrophils Relative % 73 %   Neutro Abs 5.0 1.7 - 7.7 K/uL   Lymphocytes Relative 12 %   Lymphs Abs 0.8 0.7 - 4.0 K/uL   Monocytes Relative 10 %   Monocytes Absolute 0.6 0.1 - 1.0 K/uL   Eosinophils Relative 3 %   Eosinophils Absolute 0.2 0.0 - 0.5 K/uL   Basophils Relative 1 %   Basophils  Absolute 0.0 0.0 - 0.1 K/uL   Immature Granulocytes 1 %   Abs Immature Granulocytes 0.03 0.00 - 0.07 K/uL    Comment: Performed at [facility], [address], [place], [place] [address]  Comprehensive metabolic panel     Status: Abnormal   Collection Time: 08/27/20 10:18 AM  Result Value Ref Range   Sodium 132 (L) 135 - 145 mmol/L   Potassium 7.3 (HH) 3.5 - 5.1 mmol/L    Comment: CRITICAL RESULT CALLED TO, READ BACK BY AND VERIFIED WITH: I EVANS RN [phone]    Chloride 98 98 - 111 mmol/L   CO2 19 (L) 22 - 32 mmol/L   Glucose, Bld 144 (H) 70 - 99 mg/dL    Comment: Glucose reference range applies only to samples taken after fasting for at least 8 hours.   BUN 80 (H) 8 - 23 mg/dL   Creatinine, Ser 4.13 (H) 0.61 - 1.24 mg/dL   Calcium 9.0 8.9 - 24.4 mg/dL   Total Protein 5.9 (L) 6.5 - 8.1 g/dL   Albumin 2.4 (L) 3.5 - 5.0 g/dL   AST 51 (H) 15 - 41 U/L   ALT 21 0 - 44 U/L   Alkaline Phosphatase 53 38 - 126 U/L   Total Bilirubin 1.8 (H) 0.3 - 1.2 mg/dL   GFR, Estimated 11 (L) >60 mL/min    Comment: (NOTE) Calculated using the CKD-EPI Creatinine Equation (2021)    Anion gap 15 5 - 15    Comment: Performed at [facility], [address], [place], [place] [address]  CBG monitoring, ED     Status: Abnormal   Collection Time: 08/27/20 10:23 AM  Result Value Ref Range   Glucose-Capillary 147 (H) 70 - 99 mg/dL    Comment: Glucose reference range applies only to samples taken after fasting for at least 8 hours.  Sedimentation rate     Status: Abnormal   Collection Time: 08/27/20 12:08 PM  Result Value Ref Range   Sed Rate 31 (H) 0 - 16 mm/hr    Comment: Performed at [facility], [address] [place], [place] [address]  C-reactive protein     Status: None   Collection Time: 08/27/20 12:08 PM  Result Value Ref Range   CRP <0.5 <1.0 mg/dL    Comment: Performed at [facility], [address] [place], [place] [address]  CBG monitoring, ED     Status:  Abnormal   Collection Time: 08/27/20  1:26 PM  Result Value Ref Range   Glucose-Capillary 142 (H) 70 - 99 mg/dL    Comment: Glucose reference range applies only to samples taken after fasting for at least 8 hours.  CBG monitoring, ED     Status: Abnormal   Collection Time: 08/27/20  3:12 PM  Result Value Ref Range   Glucose-Capillary 104 (H) 70 - 99 mg/dL    Comment: Glucose reference range applies only to samples taken after fasting for at least 8 hours.  I-stat chem 8, ED     Status: Abnormal   Collection Time: 08/27/20  6:33 PM  Result Value Ref Range   Sodium 135 135 - 145 mmol/L   Potassium 3.8 3.5 - 5.1 mmol/L   Chloride 101 98 - 111 mmol/L   BUN 76 (H) 8 - 23 mg/dL   Creatinine, Ser 1.32 (H) 0.61 - 1.24 mg/dL   Glucose, Bld 79 70 - 99 mg/dL    Comment: Glucose reference range applies only to samples taken after fasting for at least 8 hours.   Calcium, Ion 1.18 1.15 - 1.40 mmol/L   TCO2 24 22 - 32 mmol/L   Hemoglobin 12.9 (L) 13.0 - 17.0 g/dL   HCT 44.0 (L) 10.2 - 72.5 %    MR BRAIN WO CONTRAST  Result Date: 08/27/2020 CLINICAL DATA:  Neuro deficit.  Stroke suspected. EXAM: MRI HEAD WITHOUT CONTRAST TECHNIQUE: Multiplanar, multiecho pulse sequences of the brain and surrounding structures were obtained without intravenous contrast. COMPARISON:  CT head without contrast 08/27/2020. CT a head and neck and CT perfusion 08/27/2020. MR head without contrast 08/01/2020 FINDINGS: Brain: No acute infarct, hemorrhage, or mass lesion is present. The ventricles are of normal size. Mild atrophy and minimal white matter changes are within normal limits for age. The ventricles are of normal size. No significant extraaxial fluid collection is present. The internal auditory canals are within normal limits. 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 Cerebellum otherwise within normal limits. Vascular: Flow is present in the major intracranial arteries. Skull and upper cervical spine:  Nasopharyngeal soft tissue prominence slightly improved from prior study. Heterogeneous signal changes again noted in the clivus and dens. Craniocervical junction otherwise within normal limits. Midline structures otherwise unremarkable. Sinuses/Orbits: Bilateral mastoid effusions are again noted. No obstructing nasopharyngeal lesion is evident. Mild mucosal thickening is present in the floor of the maxillary sinuses bilaterally. The paranasal sinuses and mastoid air cells are otherwise clear. Bilateral lens replacements are noted. Globes and orbits are otherwise unremarkable. IMPRESSION: 1. No acute intracranial abnormality. 2. Normal MRI appearance of the brain for age. 3. Bilateral mastoid effusions. 4. Nasopharyngeal soft tissue prominence slightly improved from prior study. Findings consistent with improving known skull base infection. Electronically Signed   By: Marin Roberts M.D.   On: 08/27/2020 16:51   CT CEREBRAL PERFUSION W CONTRAST  Result Date: 08/27/2020 CLINICAL DATA:  Neuro deficit, acute, stroke suspected EXAM: CT ANGIOGRAPHY HEAD AND NECK CT VENOGRAM HEAD CT PERFUSION BRAIN TECHNIQUE: Multidetector CT imaging of the head and neck was performed using the standard protocol during bolus administration of intravenous contrast. Multiplanar CT image  reconstructions and MIPs were obtained to evaluate the vascular anatomy. Carotid stenosis measurements (when applicable) are obtained utilizing NASCET criteria, using the distal internal carotid diameter as the denominator. Delayed multidetector CT imaging of the head was performed following the administration of intravenous contrast. Multiplanar CT image reconstructions and MIPS were obtained to evaluate the venous anatomy. Multiphase CT imaging of the brain was performed following IV bolus contrast injection. Subsequent parametric perfusion maps were calculated using RAPID software. CONTRAST:  OMNIPAQUE IOHEXOL 350 MG/ML SOLN COMPARISON:   Prior CT and MR imaging FINDINGS: CTV HEAD Superior sagittal sinus, straight sinus, vein of Galen, and internal cerebral veins are patent. Bilateral transverse and sigmoid sinuses are patent. There is suspected persistent nonocclusive thrombus within the proximal internal jugular veins. CTA NECK Aortic arch: Mild mixed plaque along the arch. Great vessel origins are patent. Right carotid system: Patent. Mild calcified plaque along the common carotid. Mixed plaque at the ICA origin without stenosis. Left carotid system: Patent. Focal noncalcified plaque along the common carotid causing less than 50% stenosis. Calcified plaque along proximal left ICA without stenosis. Vertebral arteries: Patent. Left vertebral artery is slightly dominant. No stenosis. Skeleton: Cervical spine degenerative changes. Skull base irregularity likely related to prior osteomyelitis. Other neck: No evidence of progressive infection. Upper chest: Included upper lungs are clear. Review of the MIP images confirms the above findings CTA HEAD Anterior circulation: Intracranial internal carotid arteries are patent with calcified plaque but no significant stenosis. Anterior and middle cerebral arteries are patent. Posterior circulation: Intracranial vertebral arteries are patent. There is calcified plaque along the left vertebral artery with mild stenosis. Basilar artery is patent. Major cerebellar artery origins are patent. Posterior cerebral arteries are patent. Review of the MIP images confirms the above findings CT Brain Perfusion Findings: CBF (<30%) Volume: 0mL Perfusion (Tmax>6.0s) volume: 0mL Mismatch Volume: 0mL Infarction Location: None. IMPRESSION: No large vessel arterial occlusion or hemodynamically significant stenosis. Perfusion imaging demonstrates no evidence of core infarction or penumbra. Suspect persistent nonocclusive thrombus within the proximal internal jugular veins bilaterally. Electronically Signed   By: Guadlupe Spanish M.D.    On: 08/27/2020 11:24   DG Chest Portable 1 View  Result Date: 08/27/2020 CLINICAL DATA:  Confused, found last night holding head, LEFT facial drooping and confusion today EXAM: PORTABLE CHEST 1 VIEW COMPARISON:  Portable exam 1207 hours compared to 06/19/2020 FINDINGS: Borderline enlargement of cardiac silhouette. Mediastinal contours and pulmonary vascularity normal. Atherosclerotic calcification aorta. Tip of RIGHT arm PICC line projects over SVC. Lungs clear. No infiltrate, pleural effusion, or pneumothorax. Bones demineralized. IMPRESSION: No acute abnormalities. Aortic Atherosclerosis (ICD10-I70.0). Electronically Signed   By: Ulyses Southward M.D.   On: 08/27/2020 12:39   CT Renal Stone Study  Result Date: 08/27/2020 CLINICAL DATA:  Acute renal failure EXAM: CT ABDOMEN AND PELVIS WITHOUT CONTRAST TECHNIQUE: Multidetector CT imaging of the abdomen and pelvis was performed following the standard protocol without IV contrast. COMPARISON:  None. FINDINGS: Lower chest: Trace bilateral pleural effusions with associated bibasilar opacities, likely atelectasis. Heart size is upper limits of normal. Trace pericardial effusion. Atherosclerotic calcifications of the coronary arteries. Calcified right hilar lymph nodes and right basilar granuloma. Hepatobiliary: Unremarkable unenhanced appearance of the liver. No focal liver lesion identified. Gallbladder within normal limits. No hyperdense gallstone. No biliary dilatation. Pancreas: Unremarkable. No pancreatic ductal dilatation or surrounding inflammatory changes. Spleen: Normal in size without focal abnormality. Adrenals/Urinary Tract: Unremarkable adrenal glands. There are multiple bilateral renal cysts, the largest within the upper pole  of the right kidney measuring up to 4.0 cm. Additional bilateral subcentimeter renal lesions are too small to definitively characterize, but most likely also represent cysts. No renal stone or hydronephrosis. Nonspecific bilateral  perinephric stranding. There is excreted contrast within the urinary bladder. The bladder base is obscured by artifact from patient's hip prostheses. Stomach/Bowel: Stomach is within normal limits. Appendix not definitively visualized. No evidence of bowel wall thickening, distention, or inflammatory changes. Vascular/Lymphatic: Extensive atherosclerotic calcification throughout the aortoiliac axis. No aneurysm. No abdominopelvic lymphadenopathy. Reproductive: Obscured by artifact from hip prostheses. Other: No organized abdominopelvic fluid collection. No pneumoperitoneum. No abdominal wall hernia. Musculoskeletal: Partially visualized bilateral hip prostheses without apparent complication. Lumbosacral fusion hardware appears intact. No acute osseous findings. Mild anasarca. IMPRESSION: 1. No acute abdominopelvic findings. Specifically, no evidence of obstructive uropathy. 2.  Nonspecific bilateral perinephric stranding. 3. Trace bilateral pleural effusions with associated bibasilar opacities, likely atelectasis. 4. Trace pericardial effusion. 5. Bilateral renal cysts. Additional bilateral subcentimeter renal lesions are too small to definitively characterize, but most likely also represent cysts. Aortic Atherosclerosis (ICD10-I70.0). Electronically Signed   By: Duanne Guess D.O.   On: 08/27/2020 16:19   CT VENOGRAM HEAD  Result Date: 08/27/2020 CLINICAL DATA:  Neuro deficit, acute, stroke suspected EXAM: CT ANGIOGRAPHY HEAD AND NECK CT VENOGRAM HEAD CT PERFUSION BRAIN TECHNIQUE: Multidetector CT imaging of the head and neck was performed using the standard protocol during bolus administration of intravenous contrast. Multiplanar CT image reconstructions and MIPs were obtained to evaluate the vascular anatomy. Carotid stenosis measurements (when applicable) are obtained utilizing NASCET criteria, using the distal internal carotid diameter as the denominator. Delayed multidetector CT imaging of the head was  performed following the administration of intravenous contrast. Multiplanar CT image reconstructions and MIPS were obtained to evaluate the venous anatomy. Multiphase CT imaging of the brain was performed following IV bolus contrast injection. Subsequent parametric perfusion maps were calculated using RAPID software. CONTRAST:  OMNIPAQUE IOHEXOL 350 MG/ML SOLN COMPARISON:  Prior CT and MR imaging FINDINGS: CTV HEAD Superior sagittal sinus, straight sinus, vein of Galen, and internal cerebral veins are patent. Bilateral transverse and sigmoid sinuses are patent. There is suspected persistent nonocclusive thrombus within the proximal internal jugular veins. CTA NECK Aortic arch: Mild mixed plaque along the arch. Great vessel origins are patent. Right carotid system: Patent. Mild calcified plaque along the common carotid. Mixed plaque at the ICA origin without stenosis. Left carotid system: Patent. Focal noncalcified plaque along the common carotid causing less than 50% stenosis. Calcified plaque along proximal left ICA without stenosis. Vertebral arteries: Patent. Left vertebral artery is slightly dominant. No stenosis. Skeleton: Cervical spine degenerative changes. Skull base irregularity likely related to prior osteomyelitis. Other neck: No evidence of progressive infection. Upper chest: Included upper lungs are clear. Review of the MIP images confirms the above findings CTA HEAD Anterior circulation: Intracranial internal carotid arteries are patent with calcified plaque but no significant stenosis. Anterior and middle cerebral arteries are patent. Posterior circulation: Intracranial vertebral arteries are patent. There is calcified plaque along the left vertebral artery with mild stenosis. Basilar artery is patent. Major cerebellar artery origins are patent. Posterior cerebral arteries are patent. Review of the MIP images confirms the above findings CT Brain Perfusion Findings: CBF (<30%) Volume: 63mL  Perfusion (Tmax>6.0s) volume: 57mL Mismatch Volume: 72mL Infarction Location: None. IMPRESSION: No large vessel arterial occlusion or hemodynamically significant stenosis. Perfusion imaging demonstrates no evidence of core infarction or penumbra. Suspect persistent nonocclusive thrombus within the proximal  internal jugular veins bilaterally. Electronically Signed   By: Guadlupe Spanish M.D.   On: 08/27/2020 11:24   CT HEAD CODE STROKE WO CONTRAST  Result Date: 08/27/2020 CLINICAL DATA:  Code stroke. Neuro deficit, acute, stroke suspected. Diabetes and hypertension. EXAM: CT HEAD WITHOUT CONTRAST TECHNIQUE: Contiguous axial images were obtained from the base of the skull through the vertex without intravenous contrast. COMPARISON:  MRI 08/01/2020 FINDINGS: Brain: Age related atrophy. No sign of acute infarction, mass lesion, hemorrhage, hydrocephalus or extra-axial collection. Vascular: There is atherosclerotic calcification of the major vessels at the base of the brain. Skull: No evidence gross destructive change of the skull base. See prior imaging. Sinuses/Orbits: Mucosal thickening of the maxillary sinuses. Persistent bilateral mastoid effusions, improved since the previous imaging. There is no longer any fluid in the middle ears. Other: None ASPECTS ([place] Stroke Program Early CT Score) - Ganglionic level infarction (caudate, lentiform nuclei, internal capsule, insula, M1-M3 cortex): 7 - Supraganglionic infarction (M4-M6 cortex): 3 Total score (0-10 with 10 being normal): 10 IMPRESSION: 1. No acute or focal brain finding. 2. ASPECTS is 10 3. Less fluid in the mastoid air cells. Resolution of fluid in the middle ears. 4. These results were communicated to Dr. Wilford Corner at 10:37 am on 08/27/2020 by text page via the [facility] messaging system. Electronically Signed   By: Paulina Fusi M.D.   On: 08/27/2020 10:39   CT ANGIO HEAD NECK W WO CM (CODE STROKE)  Result Date: 08/27/2020 CLINICAL DATA:  Neuro deficit, acute,  stroke suspected EXAM: CT ANGIOGRAPHY HEAD AND NECK CT VENOGRAM HEAD CT PERFUSION BRAIN TECHNIQUE: Multidetector CT imaging of the head and neck was performed using the standard protocol during bolus administration of intravenous contrast. Multiplanar CT image reconstructions and MIPs were obtained to evaluate the vascular anatomy. Carotid stenosis measurements (when applicable) are obtained utilizing NASCET criteria, using the distal internal carotid diameter as the denominator. Delayed multidetector CT imaging of the head was performed following the administration of intravenous contrast. Multiplanar CT image reconstructions and MIPS were obtained to evaluate the venous anatomy. Multiphase CT imaging of the brain was performed following IV bolus contrast injection. Subsequent parametric perfusion maps were calculated using RAPID software. CONTRAST:  OMNIPAQUE IOHEXOL 350 MG/ML SOLN COMPARISON:  Prior CT and MR imaging FINDINGS: CTV HEAD Superior sagittal sinus, straight sinus, vein of Galen, and internal cerebral veins are patent. Bilateral transverse and sigmoid sinuses are patent. There is suspected persistent nonocclusive thrombus within the proximal internal jugular veins. CTA NECK Aortic arch: Mild mixed plaque along the arch. Great vessel origins are patent. Right carotid system: Patent. Mild calcified plaque along the common carotid. Mixed plaque at the ICA origin without stenosis. Left carotid system: Patent. Focal noncalcified plaque along the common carotid causing less than 50% stenosis. Calcified plaque along proximal left ICA without stenosis. Vertebral arteries: Patent. Left vertebral artery is slightly dominant. No stenosis. Skeleton: Cervical spine degenerative changes. Skull base irregularity likely related to prior osteomyelitis. Other neck: No evidence of progressive infection. Upper chest: Included upper lungs are clear. Review of the MIP images confirms the above findings CTA HEAD  Anterior circulation: Intracranial internal carotid arteries are patent with calcified plaque but no significant stenosis. Anterior and middle cerebral arteries are patent. Posterior circulation: Intracranial vertebral arteries are patent. There is calcified plaque along the left vertebral artery with mild stenosis. Basilar artery is patent. Major cerebellar artery origins are patent. Posterior cerebral arteries are patent. Review of the MIP images confirms the  above findings CT Brain Perfusion Findings: CBF (<30%) Volume: 0mL Perfusion (Tmax>6.0s) volume: 0mL Mismatch Volume: 0mL Infarction Location: None. IMPRESSION: No large vessel arterial occlusion or hemodynamically significant stenosis. Perfusion imaging demonstrates no evidence of core infarction or penumbra. Suspect persistent nonocclusive thrombus within the proximal internal jugular veins bilaterally. Electronically Signed   By: Guadlupe SpanishPraneil  Patel M.D.   On: 08/27/2020 11:24    Assessment/Plan   AKI/ hyperkalemia: hyperkalemia treated effectively with Lokelma, insulin/ dextrose, Lasix, calcium gluconate.  Serially monitor.  Renal US with no hydro or obstruction.  Concerns include some not-captured hypotensive events resulting in pre-renal injury/ ATN, AIN d/t cephalosporin use.  Was also hypertensive--> ? Malignant HTN.  Plan as follows:        - getting UA/ lytes/ osms, UP/C        - off Eliquis, on heparin gtt        - will send basic serologic workup so far        - may need a biopsy to elucidate cause of AKI if UA or serologies unrevealing        - no acute indication for dialysis at present but may need to entertain idea if other causes of encephalopathy ruled out--> discussed this with dtr        - hold metformin and lisinopril        - would try to limit further contrast exposure unless absolutely necessary        - check CK too Acute encephalopathy: infectious vs metabolic per neuro--> cultures ordered, neuro on board.  EEG recommended.   ? If neurotoxicity from cefepime is a possibility here Osteomyelitis of the skull base: ID follows as OP--> anticipate them getting involved in the AM.  On cefepime 2 g IV q 8 now.  If either neuro or nephrotoxicity confirmed, would likely have to switch to another agent HTN: hold lisinopril, amlodipine 5 mg daily added DM II hold metformin Sigmoid venous thrombosis and IJ thrombosis: hold Eliquis, on hep gtt for now Dispo: admitted  Ad Guttman, Lanora ManisLIZABETH 08/27/2020, 7:15 PM

## 2020-08-27 NOTE — ED Notes (Signed)
Pt's daughter stated that BP cuff bruised pt's arm because it got really tight. I explained to pt that we are suppose to check his vitals every 30 minutes and that I can change the frequency to thirty minutes. She stated that was fine and asked if we could move the cuff to his wrist. I then moved BP cuff to lower part of his arm and changed the frequency to 30 minutes.

## 2020-08-27 NOTE — ED Notes (Signed)
Pt returns to room from CT 

## 2020-08-27 NOTE — ED Notes (Signed)
Phlebotomy at bedside.

## 2020-08-27 NOTE — ED Notes (Signed)
Neurology at bedside.

## 2020-08-27 NOTE — Progress Notes (Signed)
ANTICOAGULATION CONSULT NOTE - Initial Consult  Pharmacy Consult for Heparin Indication:  bilateral venous sinus thrombosis, sigmoid sinus and jugular vein  Allergies  Allergen Reactions   Tape Other (See Comments)    STICKY TAPE PEELS SKIN OFF     Patient Measurements:   Heparin Dosing Weight: 78.9 kg  Vital Signs: Temp: 97.5 F (36.4 C) (08/08 1500) Temp Source: Oral (08/08 1500) BP: 164/78 (08/08 1829) Pulse Rate: 56 (08/08 1829)  Labs: Recent Labs    08/27/20 1018 08/27/20 1833  HGB 11.9* 12.9*  HCT 34.6* 38.0*  PLT 218  --   APTT 32  --   LABPROT 16.5*  --   INR 1.3*  --   CREATININE 4.76* 5.30*    CrCl cannot be calculated (Unknown ideal weight.).   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 Patient initiated on heparin during that admission and transitioned to apixaban prior to discharge.  Last documented heparin rate 1250 units/hr with therapeutic levels.  Last dose of apixaban prior to arrival 08/26/20 at 1900. Will require aPTT and anti-Xa monitoring until levels are correlated. Baseline aPTT 32.  Goal of Therapy:  Heparin level 0.3-0.7 units/ml aPTT 66-102 seconds Monitor platelets by anticoagulation protocol: Yes   Plan:  Give 3000 units bolus x 1 - reduced bolus given transition from DOAC in the setting of renal failure Start heparin infusion at 1300 units/hr Check aPTT anti-Xa  level in 6-8 hours and daily while on heparin Will require aPTT and anti-Xa monitoring until levels are correlated Continue to monitor H&H and platelets  Delmar Landau, PharmD, BCPS 08/27/2020 8:16 PM ED Clinical Pharmacist -  [phone]

## 2020-08-27 NOTE — ED Provider Notes (Signed)
[facility] EMERGENCY DEPARTMENT Provider Note   CSN: [id number] Arrival date & time: 08/27/20  1006  An emergency department physician performed an initial assessment on this suspected stroke patient at 1009.  History Chief Complaint  Patient presents with   Code Stroke    Nicolas James is a 87 y.o. male.  HPI 87 year old presenting for headache and altered mental status.  Past medical history includes osteomyelitis of C-spine, T2DM, central venous sinus thrombosis.  Currently on Eliquis.  Family reports that he was last normal at 10:30 PM.  Today, he has been unable to speak.  He appears confused.  His daughter is next of kin and is on her way to the ED.  Per family, patient would not want CPR or intubation.  Per daughter: Patient lives at home and gets 3 times daily cefepime that she infuses.  3 nights ago, he had an episode of vomiting after his cefepime dose.  He had subsequent nausea and vomiting the following day.  At baseline, patient is functional, ambulatory, and able to perform his own ADLs.  He is hard of hearing at baseline.  Communication is often done through words written on a notepad.  Today, patient was found sitting on the edge of the bed confused and unable to respond appropriately.     Past Medical History:  Diagnosis Date   Diabetes mellitus without complication (HCC)    Hypertension    Thyroid disease     Patient Active Problem List   Diagnosis Date Noted   Encephalopathy acute 08/27/2020   AKI (acute kidney injury) (HCC) 08/27/2020   Acute metabolic encephalopathy 08/27/2020   Cervicalgia 07/17/2020   Medication monitoring encounter 07/17/2020   Mastoiditis of both sides 07/17/2020   Cerebral venous sinus thrombosis    Osteomyelitis of skull (HCC) 06/19/2020   Hypertensive urgency 06/19/2020   Anemia 06/19/2020   Insulin dependent type 2 diabetes mellitus (HCC) 06/19/2020   Pseudomonas aeruginosa infection 06/19/2020   Mastoiditis  06/18/2020    Past Surgical History:  Procedure Laterality Date   JOINT REPLACEMENT Bilateral    Hips and Knees       History reviewed. No pertinent family history.  Social History   Tobacco Use   Smoking status: Never   Smokeless tobacco: Never  Substance Use Topics   Alcohol use: Never   Drug use: Never    Home Medications Prior to Admission medications   Medication Sig Start Date End Date Taking? Authorizing Provider  apixaban (ELIQUIS) 5 MG TABS tablet Take 1 tablet (5 mg total) by mouth 2 (two) times daily. 06/29/20  Yes Penumalli, Glenford Bayley, MD  ceFEPime (MAXIPIME) IVPB Inject 2 g into the vein 3 (three) times daily. 8AM,  3PM,  10PM   Yes [provider]  cholecalciferol (VITAMIN D) 25 MCG (1000 UNIT) tablet Take 1,000 Units by mouth daily.   Yes [provider]  insulin detemir (LEVEMIR) 100 UNIT/ML injection Inject 0-10 Units into the skin See admin instructions. Check blood sugar at BEDTIME and administer according to Sliding Scale: If BS is <70 initiate HYPOglycemia Guidelines, 70-150=0u, 151-200=1u, 201-250=2u, 251-300=3u, 301-350=4u, 351-400=6u, >400=10u and notify provider. Repeat POC blood  sugar check in 2 hours. Continue to repeat 10u and POC blood sugar check every 2 hours until blood glucose is less the /dL. Once BS is 300mg /dL, repeat blood sugar in 4 hours, then resume normal POC BS check   Yes [provider]  insulin regular (NOVOLIN R) 100 units/mL injection  Inject 1-10 Units into the skin See admin instructions. Per sliding scale 3 times daily with meals 70-150=0 units 151-200=1 units 201-250=2 units  251-300=3 units 301-350=4 units 351-400=6 units Greater than 400=10 units   Yes [provider]  levothyroxine (SYNTHROID) 88 MCG tablet Take 88 mcg by mouth daily.   Yes [provider]  lisinopril (ZESTRIL) 10 MG tablet Take 1 tablet (10 mg total) by mouth at bedtime. 06/24/20  Yes Hongalgi, Maximino GreenlandAnand D, MD  metFORMIN  (GLUCOPHAGE) 850 MG tablet Take 850 mg by mouth 2 (two) times daily with a meal.   Yes [provider]  simvastatin (ZOCOR) 40 MG tablet Take 40 mg by mouth at bedtime.   Yes [provider]  timolol (BETIMOL) 0.5 % ophthalmic solution Place 1 drop into the right eye 2 (two) times daily.   Yes [provider]  tobramycin-dexamethasone (TOBRADEX) ophthalmic solution Place 4 drops into both ears 2 (two) times daily. 06/24/20  Yes Hongalgi, Maximino GreenlandAnand D, MD  vitamin B-12 (CYANOCOBALAMIN) 1000 MCG tablet Take 1,000 mcg by mouth daily.   Yes [provider]    Allergies    Tape  Review of Systems   Review of Systems  Constitutional:  Positive for appetite change. Negative for chills, fatigue and fever.  HENT:  Negative for ear pain and sore throat.   Eyes:  Negative for pain and visual disturbance.  Respiratory:  Negative for cough and shortness of breath.   Cardiovascular:  Negative for chest pain and palpitations.  Gastrointestinal:  Positive for nausea and vomiting. Negative for abdominal pain.  Musculoskeletal:  Negative for arthralgias, back pain, joint swelling and neck pain.  Skin:  Negative for color change, rash and wound.  Neurological:  Positive for facial asymmetry and speech difficulty. Negative for seizures and syncope.  Hematological:  Does not bruise/bleed easily (On Eliquis).  Psychiatric/Behavioral:  Positive for confusion.   All other systems reviewed and are negative.  Physical Exam Updated Vital Signs BP (!) 164/78 (BP Location: Left Arm)   Pulse (!) 56   Resp 12   SpO2 99%   Physical Exam Vitals and nursing note reviewed.  Constitutional:      General: He is not in acute distress.    Appearance: Normal appearance. He is well-developed. He is not ill-appearing, toxic-appearing or diaphoretic.  HENT:     Head: Normocephalic and atraumatic.     Right Ear: External ear normal.     Left Ear: External ear normal.     Nose: Nose normal.      Mouth/Throat:     Mouth: Mucous membranes are moist.     Pharynx: Oropharynx is clear.  Eyes:     Extraocular Movements: Extraocular movements intact.     Conjunctiva/sclera: Conjunctivae normal.  Cardiovascular:     Rate and Rhythm: Normal rate and regular rhythm.     Heart sounds: No murmur heard. Pulmonary:     Effort: Pulmonary effort is normal. No respiratory distress.     Breath sounds: Normal breath sounds. No wheezing or rales.  Abdominal:     Palpations: Abdomen is soft.     Tenderness: There is no abdominal tenderness. There is no guarding.  Musculoskeletal:        General: No swelling, deformity or signs of injury.     Cervical back: Normal range of motion and neck supple. No rigidity.     Right lower leg: No edema.     Left lower leg: No edema.  Skin:  General: Skin is warm and dry.     Coloration: Skin is not jaundiced or pale.  Neurological:     Mental Status: He is alert. He is disoriented.     Comments: Patient unable to speak.  Mild facial asymmetry noted.  He is able to move all extremities.  Neurologic exam limited by the patient's inability to follow most commands or answer questions.    ED Results / Procedures / Treatments   Labs (all labs ordered are listed, but only abnormal results are displayed) Labs Reviewed  PROTIME-INR - Abnormal; Notable for the following components:      Result Value   Prothrombin Time 16.5 (*)    INR 1.3 (*)    All other components within normal limits  CBC - Abnormal; Notable for the following components:   RBC 4.04 (*)    Hemoglobin 11.9 (*)    HCT 34.6 (*)    All other components within normal limits  COMPREHENSIVE METABOLIC PANEL - Abnormal; Notable for the following components:   Sodium 132 (*)    Potassium 7.3 (*)    CO2 19 (*)    Glucose, Bld 144 (*)    BUN 80 (*)    Creatinine, Ser 4.76 (*)    Total Protein 5.9 (*)    Albumin 2.4 (*)    AST 51 (*)    Total Bilirubin 1.8 (*)    GFR, Estimated 11 (*)     All other components within normal limits  SEDIMENTATION RATE - Abnormal; Notable for the following components:   Sed Rate 31 (*)    All other components within normal limits  I-STAT CHEM 8, ED - Abnormal; Notable for the following components:   BUN 76 (*)    Creatinine, Ser 5.30 (*)    Hemoglobin 12.9 (*)    HCT 38.0 (*)    All other components within normal limits  CBG MONITORING, ED - Abnormal; Notable for the following components:   Glucose-Capillary 147 (*)    All other components within normal limits  CBG MONITORING, ED - Abnormal; Notable for the following components:   Glucose-Capillary 142 (*)    All other components within normal limits  CBG MONITORING, ED - Abnormal; Notable for the following components:   Glucose-Capillary 104 (*)    All other components within normal limits  RESP PANEL BY RT-PCR (FLU A&B, COVID) [id number]  CULTURE, BLOOD (ROUTINE X 2)  CULTURE, BLOOD (ROUTINE X 2)  URINE CULTURE  ETHANOL  APTT  DIFFERENTIAL  C-REACTIVE PROTEIN  RAPID URINE DRUG SCREEN, HOSP PERFORMED  URINALYSIS, ROUTINE W REFLEX MICROSCOPIC  GLUCOSE, RANDOM  BASIC METABOLIC PANEL  I-STAT CHEM 8, ED    EKG EKG Interpretation  Date/Time:  Monday August 27 2020 18:00:39 EDT Ventricular Rate:  65 PR Interval:  190 QRS Duration: 108 QT Interval:  476 QTC Calculation: 495 R Axis:   31 Text Interpretation: Sinus rhythm Ventricular premature complex Borderline prolonged QT interval Confirmed by Gloris Manchester [phone]) on 08/27/2020 6:44:46 PM  Radiology MR BRAIN WO CONTRAST  Result Date: 08/27/2020 CLINICAL DATA:  Neuro deficit.  Stroke suspected. EXAM: MRI HEAD WITHOUT CONTRAST TECHNIQUE: Multiplanar, multiecho pulse sequences of the brain and surrounding structures were obtained without intravenous contrast. COMPARISON:  CT head without contrast 08/27/2020. CT a head and neck and CT perfusion 08/27/2020. MR head without contrast 08/01/2020 FINDINGS: Brain: No acute infarct, hemorrhage, or  mass lesion is present. The ventricles are of normal size. Mild atrophy and minimal white  matter changes are within normal limits for age. The ventricles are of normal size. No significant extraaxial fluid collection is present. The internal auditory canals are within normal limits. 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 Cerebellum otherwise within normal limits. Vascular: Flow is present in the major intracranial arteries. Skull and upper cervical spine: Nasopharyngeal soft tissue prominence slightly improved from prior study. Heterogeneous signal changes again noted in the clivus and dens. Craniocervical junction otherwise within normal limits. Midline structures otherwise unremarkable. Sinuses/Orbits: Bilateral mastoid effusions are again noted. No obstructing nasopharyngeal lesion is evident. Mild mucosal thickening is present in the floor of the maxillary sinuses bilaterally. The paranasal sinuses and mastoid air cells are otherwise clear. Bilateral lens replacements are noted. Globes and orbits are otherwise unremarkable. IMPRESSION: 1. No acute intracranial abnormality. 2. Normal MRI appearance of the brain for age. 3. Bilateral mastoid effusions. 4. Nasopharyngeal soft tissue prominence slightly improved from prior study. Findings consistent with improving known skull base infection. Electronically Signed   By: Marin Roberts M.D.   On: 08/27/2020 16:51   CT CEREBRAL PERFUSION W CONTRAST  Result Date: 08/27/2020 CLINICAL DATA:  Neuro deficit, acute, stroke suspected EXAM: CT ANGIOGRAPHY HEAD AND NECK CT VENOGRAM HEAD CT PERFUSION BRAIN TECHNIQUE: Multidetector CT imaging of the head and neck was performed using the standard protocol during bolus administration of intravenous contrast. Multiplanar CT image reconstructions and MIPs were obtained to evaluate the vascular anatomy. Carotid stenosis measurements (when applicable) are obtained utilizing NASCET criteria, using the  distal internal carotid diameter as the denominator. Delayed multidetector CT imaging of the head was performed following the administration of intravenous contrast. Multiplanar CT image reconstructions and MIPS were obtained to evaluate the venous anatomy. Multiphase CT imaging of the brain was performed following IV bolus contrast injection. Subsequent parametric perfusion maps were calculated using RAPID software. CONTRAST:  OMNIPAQUE IOHEXOL 350 MG/ML SOLN COMPARISON:  Prior CT and MR imaging FINDINGS: CTV HEAD Superior sagittal sinus, straight sinus, vein of Galen, and internal cerebral veins are patent. Bilateral transverse and sigmoid sinuses are patent. There is suspected persistent nonocclusive thrombus within the proximal internal jugular veins. CTA NECK Aortic arch: Mild mixed plaque along the arch. Great vessel origins are patent. Right carotid system: Patent. Mild calcified plaque along the common carotid. Mixed plaque at the ICA origin without stenosis. Left carotid system: Patent. Focal noncalcified plaque along the common carotid causing less than 50% stenosis. Calcified plaque along proximal left ICA without stenosis. Vertebral arteries: Patent. Left vertebral artery is slightly dominant. No stenosis. Skeleton: Cervical spine degenerative changes. Skull base irregularity likely related to prior osteomyelitis. Other neck: No evidence of progressive infection. Upper chest: Included upper lungs are clear. Review of the MIP images confirms the above findings CTA HEAD Anterior circulation: Intracranial internal carotid arteries are patent with calcified plaque but no significant stenosis. Anterior and middle cerebral arteries are patent. Posterior circulation: Intracranial vertebral arteries are patent. There is calcified plaque along the left vertebral artery with mild stenosis. Basilar artery is patent. Major cerebellar artery origins are patent. Posterior cerebral arteries are patent. Review of  the MIP images confirms the above findings CT Brain Perfusion Findings: CBF (<30%) Volume: 0mL Perfusion (Tmax>6.0s) volume: 0mL Mismatch Volume: 0mL Infarction Location: None. IMPRESSION: No large vessel arterial occlusion or hemodynamically significant stenosis. Perfusion imaging demonstrates no evidence of core infarction or penumbra. Suspect persistent nonocclusive thrombus within the proximal internal jugular veins bilaterally. Electronically Signed   By: Elaina Hoops  Patel M.D.   On: 08/27/2020 11:24   DG Chest Portable 1 View  Result Date: 08/27/2020 CLINICAL DATA:  Confused, found last night holding head, LEFT facial drooping and confusion today EXAM: PORTABLE CHEST 1 VIEW COMPARISON:  Portable exam 1207 hours compared to 06/19/2020 FINDINGS: Borderline enlargement of cardiac silhouette. Mediastinal contours and pulmonary vascularity normal. Atherosclerotic calcification aorta. Tip of RIGHT arm PICC line projects over SVC. Lungs clear. No infiltrate, pleural effusion, or pneumothorax. Bones demineralized. IMPRESSION: No acute abnormalities. Aortic Atherosclerosis (ICD10-I70.0). Electronically Signed   By: Ulyses Southward M.D.   On: 08/27/2020 12:39   CT Renal Stone Study  Result Date: 08/27/2020 CLINICAL DATA:  Acute renal failure EXAM: CT ABDOMEN AND PELVIS WITHOUT CONTRAST TECHNIQUE: Multidetector CT imaging of the abdomen and pelvis was performed following the standard protocol without IV contrast. COMPARISON:  None. FINDINGS: Lower chest: Trace bilateral pleural effusions with associated bibasilar opacities, likely atelectasis. Heart size is upper limits of normal. Trace pericardial effusion. Atherosclerotic calcifications of the coronary arteries. Calcified right hilar lymph nodes and right basilar granuloma. Hepatobiliary: Unremarkable unenhanced appearance of the liver. No focal liver lesion identified. Gallbladder within normal limits. No hyperdense gallstone. No biliary dilatation. Pancreas:  Unremarkable. No pancreatic ductal dilatation or surrounding inflammatory changes. Spleen: Normal in size without focal abnormality. Adrenals/Urinary Tract: Unremarkable adrenal glands. There are multiple bilateral renal cysts, the largest within the upper pole of the right kidney measuring up to 4.0 cm. Additional bilateral subcentimeter renal lesions are too small to definitively characterize, but most likely also represent cysts. No renal stone or hydronephrosis. Nonspecific bilateral perinephric stranding. There is excreted contrast within the urinary bladder. The bladder base is obscured by artifact from patient's hip prostheses. Stomach/Bowel: Stomach is within normal limits. Appendix not definitively visualized. No evidence of bowel wall thickening, distention, or inflammatory changes. Vascular/Lymphatic: Extensive atherosclerotic calcification throughout the aortoiliac axis. No aneurysm. No abdominopelvic lymphadenopathy. Reproductive: Obscured by artifact from hip prostheses. Other: No organized abdominopelvic fluid collection. No pneumoperitoneum. No abdominal wall hernia. Musculoskeletal: Partially visualized bilateral hip prostheses without apparent complication. Lumbosacral fusion hardware appears intact. No acute osseous findings. Mild anasarca. IMPRESSION: 1. No acute abdominopelvic findings. Specifically, no evidence of obstructive uropathy. 2.  Nonspecific bilateral perinephric stranding. 3. Trace bilateral pleural effusions with associated bibasilar opacities, likely atelectasis. 4. Trace pericardial effusion. 5. Bilateral renal cysts. Additional bilateral subcentimeter renal lesions are too small to definitively characterize, but most likely also represent cysts. Aortic Atherosclerosis (ICD10-I70.0). Electronically Signed   By: Duanne Guess D.O.   On: 08/27/2020 16:19   CT VENOGRAM HEAD  Result Date: 08/27/2020 CLINICAL DATA:  Neuro deficit, acute, stroke suspected EXAM: CT ANGIOGRAPHY HEAD  AND NECK CT VENOGRAM HEAD CT PERFUSION BRAIN TECHNIQUE: Multidetector CT imaging of the head and neck was performed using the standard protocol during bolus administration of intravenous contrast. Multiplanar CT image reconstructions and MIPs were obtained to evaluate the vascular anatomy. Carotid stenosis measurements (when applicable) are obtained utilizing NASCET criteria, using the distal internal carotid diameter as the denominator. Delayed multidetector CT imaging of the head was performed following the administration of intravenous contrast. Multiplanar CT image reconstructions and MIPS were obtained to evaluate the venous anatomy. Multiphase CT imaging of the brain was performed following IV bolus contrast injection. Subsequent parametric perfusion maps were calculated using RAPID software. CONTRAST:  OMNIPAQUE IOHEXOL 350 MG/ML SOLN COMPARISON:  Prior CT and MR imaging FINDINGS: CTV HEAD Superior sagittal sinus, straight sinus, vein of Galen, and internal cerebral  veins are patent. Bilateral transverse and sigmoid sinuses are patent. There is suspected persistent nonocclusive thrombus within the proximal internal jugular veins. CTA NECK Aortic arch: Mild mixed plaque along the arch. Great vessel origins are patent. Right carotid system: Patent. Mild calcified plaque along the common carotid. Mixed plaque at the ICA origin without stenosis. Left carotid system: Patent. Focal noncalcified plaque along the common carotid causing less than 50% stenosis. Calcified plaque along proximal left ICA without stenosis. Vertebral arteries: Patent. Left vertebral artery is slightly dominant. No stenosis. Skeleton: Cervical spine degenerative changes. Skull base irregularity likely related to prior osteomyelitis. Other neck: No evidence of progressive infection. Upper chest: Included upper lungs are clear. Review of the MIP images confirms the above findings CTA HEAD Anterior circulation: Intracranial internal  carotid arteries are patent with calcified plaque but no significant stenosis. Anterior and middle cerebral arteries are patent. Posterior circulation: Intracranial vertebral arteries are patent. There is calcified plaque along the left vertebral artery with mild stenosis. Basilar artery is patent. Major cerebellar artery origins are patent. Posterior cerebral arteries are patent. Review of the MIP images confirms the above findings CT Brain Perfusion Findings: CBF (<30%) Volume: 0mL Perfusion (Tmax>6.0s) volume: 0mL Mismatch Volume: 0mL Infarction Location: None. IMPRESSION: No large vessel arterial occlusion or hemodynamically significant stenosis. Perfusion imaging demonstrates no evidence of core infarction or penumbra. Suspect persistent nonocclusive thrombus within the proximal internal jugular veins bilaterally. Electronically Signed   By: Guadlupe Spanish M.D.   On: 08/27/2020 11:24   CT HEAD CODE STROKE WO CONTRAST  Result Date: 08/27/2020 CLINICAL DATA:  Code stroke. Neuro deficit, acute, stroke suspected. Diabetes and hypertension. EXAM: CT HEAD WITHOUT CONTRAST TECHNIQUE: Contiguous axial images were obtained from the base of the skull through the vertex without intravenous contrast. COMPARISON:  MRI 08/01/2020 FINDINGS: Brain: Age related atrophy. No sign of acute infarction, mass lesion, hemorrhage, hydrocephalus or extra-axial collection. Vascular: There is atherosclerotic calcification of the major vessels at the base of the brain. Skull: No evidence gross destructive change of the skull base. See prior imaging. Sinuses/Orbits: Mucosal thickening of the maxillary sinuses. Persistent bilateral mastoid effusions, improved since the previous imaging. There is no longer any fluid in the middle ears. Other: None ASPECTS ([place]ber[place] Stroke Program Early CT Score) - Ganglionic level infarction (caudate, lentiform nuclei, internal capsule, insula, M1-M3 cortex): 7 - Supraganglionic infarction (M4-M6 cortex): 3  Total score (0-10 with 10 being normal): 10 IMPRESSION: 1. No acute or focal brain finding. 2. ASPECTS is 10 3. Less fluid in the mastoid air cells. Resolution of fluid in the middle ears. 4. These results were communicated to Dr. Wilford Corner at 10:37 am on 08/27/2020 by text page via the [facility] messaging system. Electronically Signed   By: Paulina Fusi M.D.   On: 08/27/2020 10:39   CT ANGIO HEAD NECK W WO CM (CODE STROKE)  Result Date: 08/27/2020 CLINICAL DATA:  Neuro deficit, acute, stroke suspected EXAM: CT ANGIOGRAPHY HEAD AND NECK CT VENOGRAM HEAD CT PERFUSION BRAIN TECHNIQUE: Multidetector CT imaging of the head and neck was performed using the standard protocol during bolus administration of intravenous contrast. Multiplanar CT image reconstructions and MIPs were obtained to evaluate the vascular anatomy. Carotid stenosis measurements (when applicable) are obtained utilizing NASCET criteria, using the distal internal carotid diameter as the denominator. Delayed multidetector CT imaging of the head was performed following the administration of intravenous contrast. Multiplanar CT image reconstructions and MIPS were obtained to evaluate the venous anatomy. Multiphase CT imaging of  the brain was performed following IV bolus contrast injection. Subsequent parametric perfusion maps were calculated using RAPID software. CONTRAST:  OMNIPAQUE IOHEXOL 350 MG/ML SOLN COMPARISON:  Prior CT and MR imaging FINDINGS: CTV HEAD Superior sagittal sinus, straight sinus, vein of Galen, and internal cerebral veins are patent. Bilateral transverse and sigmoid sinuses are patent. There is suspected persistent nonocclusive thrombus within the proximal internal jugular veins. CTA NECK Aortic arch: Mild mixed plaque along the arch. Great vessel origins are patent. Right carotid system: Patent. Mild calcified plaque along the common carotid. Mixed plaque at the ICA origin without stenosis. Left carotid system: Patent. Focal  noncalcified plaque along the common carotid causing less than 50% stenosis. Calcified plaque along proximal left ICA without stenosis. Vertebral arteries: Patent. Left vertebral artery is slightly dominant. No stenosis. Skeleton: Cervical spine degenerative changes. Skull base irregularity likely related to prior osteomyelitis. Other neck: No evidence of progressive infection. Upper chest: Included upper lungs are clear. Review of the MIP images confirms the above findings CTA HEAD Anterior circulation: Intracranial internal carotid arteries are patent with calcified plaque but no significant stenosis. Anterior and middle cerebral arteries are patent. Posterior circulation: Intracranial vertebral arteries are patent. There is calcified plaque along the left vertebral artery with mild stenosis. Basilar artery is patent. Major cerebellar artery origins are patent. Posterior cerebral arteries are patent. Review of the MIP images confirms the above findings CT Brain Perfusion Findings: CBF (<30%) Volume: 69mL Perfusion (Tmax>6.0s) volume: 25mL Mismatch Volume: 53mL Infarction Location: None. IMPRESSION: No large vessel arterial occlusion or hemodynamically significant stenosis. Perfusion imaging demonstrates no evidence of core infarction or penumbra. Suspect persistent nonocclusive thrombus within the proximal internal jugular veins bilaterally. Electronically Signed   By: Guadlupe Spanish M.D.   On: 08/27/2020 11:24    Procedures Procedures   Medications Ordered in ED Medications  ceFEPIme (MAXIPIME) 2 g in sodium chloride 0.9 % 100 mL IVPB ( Intravenous Canceled Entry 08/27/20 1842)  sodium chloride 0.9 % bolus 1,000 mL (has no administration in time range)  iohexol (OMNIPAQUE) 350 MG/ML injection 115 mL (115 mLs Intravenous Contrast Given 08/27/20 1103)  furosemide (LASIX) injection 40 mg (40 mg Intravenous Given 08/27/20 1307)  sodium chloride 0.9 % bolus 1,000 mL (0 mLs Intravenous Stopped 08/27/20 1519)  sodium  zirconium cyclosilicate (LOKELMA) packet 10 g (10 g Oral Given 08/27/20 1332)  calcium gluconate 1 g/ 50 mL sodium chloride IVPB (0 g Intravenous Stopped 08/27/20 1341)  insulin aspart (novoLOG) injection 10 Units (10 Units Intravenous Given 08/27/20 1348)    And  dextrose 50 % solution 50 mL (50 mLs Intravenous Given 08/27/20 1341)    ED Course  I have reviewed the triage vital signs and the nursing notes.  Pertinent labs & imaging results that were available during my care of the patient were reviewed by me and considered in my medical decision making (see chart for details).  Clinical Course as of 08/27/20 1851  Mon Aug 27, 2020  1749 New renal failure, now uremic, family refusing invasive procedures, osteomyelitis of c spine at home, now off clevidipine gtt, fu azotemia and repeat K [MK]    Clinical Course User Index [MK] Kommor, [place], MD   MDM Rules/Calculators/A&P                         CRITICAL CARE Performed by: Gloris Manchester   Total critical care time: 35 minutes  Critical care time was exclusive of separately billable  procedures and treating other patients.  Critical care was necessary to treat or prevent imminent or life-threatening deterioration.  Critical care was time spent personally by me on the following activities: development of treatment plan with patient and/or surrogate as well as nursing, discussions with consultants, evaluation of patient's response to treatment, examination of patient, obtaining history from patient or surrogate, ordering and performing treatments and interventions, ordering and review of laboratory studies, ordering and review of radiographic studies, pulse oximetry and re-evaluation of patient's condition.   Patient is an 87 year old male presenting for acute onset of altered mental status.  Last known normal was 10:30 PM last night.  Today, patient appears confused and is unable to speak.  His family noticed some facial asymmetry this morning.   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  He is on Eliquis at baseline.  On arrival, patient vital signs are notable for hypotension and bradycardia.  He is awake but only able to follow some commands.  There appears to be a very slight facial asymmetry.  Given the acute onset, code stroke was called.  Neuro team present at bedside shortly thereafter.  Patient was taken to CT scanner for code stroke imaging.  Imaging did not show evidence of LVO or hemorrhage.  Given his elevated blood pressure, neurology team requested Cleviprex gtt.  They also requested further work-up for underlying systemic cause of his symptoms.  MRI was ordered by neurology team.  Patient's lab work notable for AKI, azotemia, and hyperkalemia.  Potassium was 7.3.  Temporizing interventions were given.  Per chart review, patient does not take any nephrotoxic medications.  Etiology of AKI is uncertain at this time.  CT stone study was ordered to assess for obstructive uropathy.  Throughout his stay in the ED, patient's daughter was at bedside.  Due to the patient's condition, patient's daughter serves as his Social research officer, government.  Patient daughter stated that he would not want any invasive interventions, including a Foley catheter.  Given that he does not want such interventions, feel the patient would be appropriate for floor.  Cleviprex gtt. was discontinued.  Nitroglycerin paste was applied.  We will avoid use of beta-blockers given his low heart rate.  Patient underwent CT stone study and MRI of brain.   On return, patient blood pressure cuff was found to be in his lower arm.  When readjusted to his upper arm, blood pressure was in the range of 160s over 80.  Do not suspect hypertensive encephalopathy.  Patient was able to urinate at will in a urinal.  He appears to have good urine output.  Will give additional 1 L bolus of IVF.  Patient was admitted to hospitalist for further  management.  Final Clinical Impression(s) / ED Diagnoses Final diagnoses:  AKI (acute kidney injury) [facility])    Rx / DC Orders ED Discharge Orders     None        Gloris Manchester, MD 08/27/20 [id number]

## 2020-08-27 NOTE — ED Notes (Signed)
Pt's family pleased with change of BP cuff location and frequency.

## 2020-08-27 NOTE — Progress Notes (Signed)
Attempted patient multiple times however patient was too frustrated. Per Dr. Wilford Corner - hold off on EEG until tomorrow.

## 2020-08-27 NOTE — ED Notes (Signed)
Pt's daughter at bedside, Stroke team speaking to her about plan of care.

## 2020-08-27 NOTE — ED Notes (Signed)
Unable to obtain vitals, as pt is not in room due to imaging.

## 2020-08-27 NOTE — ED Notes (Addendum)
Daughter reports pt does NOT want CPR or to be intubated, family reports they are in the process of getting him a DNR. Neurology & EDP aware.

## 2020-08-27 NOTE — ED Notes (Signed)
Pt has returned back to the unit from CT & MRI, daughter remains at beside.

## 2020-08-27 NOTE — Progress Notes (Signed)
Pharmacy Antibiotic Note  Brennden Masten is a 87 y.o. 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  Pharmacy has been consulted for cefepime dosing.  Admitted 5/30-06/24/20 for headache and n/v and was found to have skull base osteomyelitis along with C1-C2 osteo as well. Patient receiving cefepime 2g q8h outpatient. Last dose prior to arrival was 8/8/22am. No doses given thus far this admission. Original end date appears to be 09/10/20  Patient's SCr is significantly elevated at 5.3.  Plan: Cefepime 2g q24h until change in renal function Monitor renal function F/u ID & nephrology recommendations  Weight: 78.9 kg (174 lb)  Temp (24hrs), Avg:97.5 F (36.4 C), Min:97.5 F (36.4 C), Max:97.5 F (36.4 C)  Recent Labs  Lab 08/27/20 1018 08/27/20 1827 08/27/20 1833  WBC 6.7  --   --   CREATININE 4.76* 5.01* 5.30*    Estimated Creatinine Clearance: 10.2 mL/min (A) (by C-G formula based on SCr of 5.3 mg/dL (H)).    Allergies  Allergen Reactions   Tape Other (See Comments)    STICKY TAPE PEELS SKIN OFF    Thank you for allowing pharmacy to be a part of this patient's care.  Cathie Hoops 08/27/2020 8:26 PM

## 2020-08-27 NOTE — ED Notes (Signed)
X-ray at bedside

## 2020-08-27 NOTE — Consult Note (Addendum)
Neurology Consultation  Reason for Consult: Confusion Referring Physician: Dr. Durwin Nora  CC: Patient does not verbalize a complaint due to impaired communication  History is obtained from: Chart review, Patient's daughter, Unable to obtain from patient due to patient's mental status/nonverbal state  HPI: Nicolas James is a 87 y.o. 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 Nicolas James presented to the ED via EMS today for evaluation of confusion at home. Per patient's daughter, Nicolas James has not been at his baseline since Friday, August 24, 2020 when she first noted that he seemed confused. She states that on Saturday he began to vomit and was unable to tolerate PO intake or his medications and that he was having trouble communicating with his family as they usually communicate on the white board with written communication. She states that on Sunday, he was no longer vomiting and was able to tolerate oral intake with improvement in communication but this morning, Nicolas James did not get out of bed, dress himself, and come down to breakfast as he usually would. She went to check on him and noted that he was sitting on the side of the bed with his head in his hands and looked confused. She gave him his morning antibiotic without improvement in confusion and subsequently activated EMS.   At baseline, patient lives with his daughter. He is usually able to get out of bed, dress, and ambulate with a walker independently. His daughter assists him with managing his medication as he is  currently on IV antibiotics at home. Due to his recent infection, Nicolas James lost his hearing and has been communicating with family via a white board and written communication. The family writes to communicate and typically, Nicolas James is able to verbally respond.   LKW: Unclear; complaints of patient seeming "off" per family since Friday, August 24, 2020  tpa given?: no, patient out of thrombolytic therapy window, on Eliquis with last dose 08/26/2020 PM IR Thrombectomy? No, presentation not consistent with LVO, vessel imaging without LVO Modified Rankin Scale: 4-Needs assistance to walk and tend to bodily needs  ROS: Unable to obtain due to altered mental status.   Past Medical History:  Diagnosis Date   Diabetes mellitus without complication (HCC)    Hypertension    Thyroid disease    Past Surgical History:  Procedure Laterality Date   JOINT REPLACEMENT Bilateral    Hips and Knees   History reviewed. No pertinent family history.  Social History:   reports that he has never smoked. He has never used smokeless tobacco. He reports that he does not drink alcohol and does not use drugs.  Medications  Current Facility-Administered Medications:    clevidipine (CLEVIPREX) infusion 0.5 mg/mL, 0-21 mg/hr, Intravenous, Continuous, Gloris Manchester, MD  Current Outpatient Medications:    apixaban (ELIQUIS) 5 MG TABS tablet, Take 1 tablet (5 mg total) by mouth 2 (two) times daily., Disp: 180 tablet, Rfl: 4   brimonidine (ALPHAGAN) 0.2 % ophthalmic solution, Place 1 drop into the right eye in the morning and at bedtime. (Patient not taking: Reported on 08/09/2020), Disp: , Rfl:    cholecalciferol (  VITAMIN D) 25 MCG (1000 UNIT) tablet, Take 1,000 Units by mouth daily., Disp: , Rfl:    insulin detemir (LEVEMIR) 100 UNIT/ML injection, Inject 7 Units into the skin at bedtime., Disp: , Rfl:    insulin regular (NOVOLIN R) 100 units/mL injection, Inject 1-10 Units into the skin See admin instructions. Per  sliding scale 3 times daily with meals 70-150=0 units 151-200=1 units 201-250=2 units  251-300=3 units 301-350=4 units 351-400=6 units Greater than 400=10 units, Disp: , Rfl:    levothyroxine (SYNTHROID) 88 MCG tablet, Take 88 mcg by mouth daily., Disp: , Rfl:    lisinopril (ZESTRIL) 10 MG tablet, Take 1 tablet (10 mg total) by mouth at bedtime., Disp: 30 tablet, Rfl: 0   metFORMIN (GLUCOPHAGE) 850 MG tablet, Take 850 mg by mouth 2 (two) times daily with a meal., Disp: , Rfl:    predniSONE (DELTASONE) 10 MG tablet, Take 10 mg by mouth daily with breakfast., Disp: , Rfl:    simvastatin (ZOCOR) 40 MG tablet, Take 40 mg by mouth at bedtime., Disp: , Rfl:    timolol (BETIMOL) 0.5 % ophthalmic solution, Place 1 drop into the right eye 2 (two) times daily., Disp: , Rfl:    tobramycin-dexamethasone (TOBRADEX) ophthalmic solution, Place 4 drops into both ears 2 (two) times daily., Disp: 5 mL, Rfl: 0   vitamin B-12 (CYANOCOBALAMIN) 1000 MCG tablet, Take 1,000 mcg by mouth daily., Disp: , Rfl:    White Petrolatum-Mineral Oil (REFRESH LACRI-LUBE) OINT, Place 1 application into the right eye as needed (dry eye)., Disp: , Rfl:   Exam: Current vital signs: BP (!) 146/88   Pulse (!) 57   Resp 15   SpO2 100%  Vital signs in last 24 hours: Pulse Rate:  [50-57] 57 (08/08 1100) Resp:  [12-15] 15 (08/08 1100) BP: (146-198)/(77-88) 146/88 (08/08 1100) SpO2:  [99 %-100 %] 100 % (08/08 1100)  GENERAL: Awake, laying in ER stretcher, alert, in no acute distress Head: Normocephalic and atraumatic, without obvious abnormality EENT: Normal conjunctivae, no OP obstruction, dry mucous membranes LUNGS: Normal respiratory effort. Non-labored breathing on room air, SpO2 98% on cardiac monitor CV: Bradycardia, irregular rhythm on cardiac monitor ABDOMEN: Soft, non-tender, non-distended Ext: warm, well perfused, without obvious deformity  NEURO:  Mental Status: Awake and alert. Patient does not verbally communicate  with staff other than the word "ouch" during assessment.  Patient is unable to provide a clear or coherent history of present illness. There is no evidence of neglect  Cranial Nerves:  II: PERRL 3 mm/brisk. Visual fields full.  III, IV, VI: EOMI without gaze preference, nystagmus, or ptosis V: Blinks to threat throughout each visual field. Unable to assess facial sensation.  VII: Face is asymmetric resting and with movement with a left mouth droop (per family mouth droop is chronic from Bell's Palsy in February, per chart review, right mouth droop is chronic) VIII: Hearing is not intact to voice with loss of hearing since May 2022. IX, X: Unable to assess palate as patient does not follow all commands, unable to assess palate XI: Head appears grossly midline XII: Unable to assess tongue protrusion   Motor: Bilateral lower extremities with 3/5 strength noted, unable to resist gravity with immediate drift. Bilateral upper extremities with 5/5 strength with constant coaching to sustain antigravity movement. No drift noted of bilateral upper extremities on assessment.  Tone is normal. Bulk is normal.  Sensation: Patient states "ouch" with light application of noxious stimuli throughout each extremity Coordination: Unable to  assess due to patient trouble with following simple and complex commands DTRs: 2+ and symmetric biceps, 1+ and symmetric patellae Gait: Deferred  NIHSS: 1a Level of Conscious.: 0 1b LOC Questions: 2 1c LOC Commands: 2 2 Best Gaze: 0 3 Visual: 0 4 Facial Palsy: 1 5a Motor Arm - left: 0 5b Motor Arm - Right: 0 6a Motor Leg - Left: 2 6b Motor Leg - Right: 2 7 Limb Ataxia: 0 8 Sensory: 0 9 Best Language: 2 10 Dysarthria: 2 11 Extinct. and Inatten.: 0 TOTAL: 13  Labs I have reviewed labs in epic and the results pertinent to this consultation are: CBC    Component Value Date/Time   WBC 6.7 08/27/2020 1018   RBC 4.04 (L) 08/27/2020 1018   HGB 11.9 (L) 08/27/2020  1018   HCT 34.6 (L) 08/27/2020 1018   PLT 218 08/27/2020 1018   MCV 85.6 08/27/2020 1018   MCH 29.5 08/27/2020 1018   MCHC 34.4 08/27/2020 1018   RDW 13.8 08/27/2020 1018   LYMPHSABS 0.8 08/27/2020 1018   MONOABS 0.6 08/27/2020 1018   EOSABS 0.2 08/27/2020 1018   BASOSABS 0.0 08/27/2020 1018   CMP     Component Value Date/Time   NA 132 (L) 06/21/2020 0520   K 3.8 06/21/2020 0520   CL 97 (L) 06/21/2020 0520   CO2 28 06/21/2020 0520   GLUCOSE 123 (H) 06/21/2020 0520   BUN 11 06/21/2020 0520   CREATININE 0.74 06/21/2020 0520   CALCIUM 8.3 (L) 06/21/2020 0520   PROT 5.0 (L) 06/20/2020 0923   ALBUMIN 2.0 (L) 06/20/2020 0923   AST 15 06/20/2020 0923   ALT 12 06/20/2020 0923   ALKPHOS 61 06/20/2020 0923   BILITOT 0.4 06/20/2020 0923   GFRNONAA >60 06/21/2020 0520   Lipid Panel  No results found for: CHOL, TRIG, HDL, CHOLHDL, VLDL, LDLCALC, LDLDIRECT  Lab Results  Component Value Date   HGBA1C 8.5 (H) 06/20/2020   Imaging I have reviewed the images obtained:  CT-scan of the brain 08/27/2020: 1. No acute or focal brain finding. 2. ASPECTS is 10 3. Less fluid in the mastoid air cells. Resolution of fluid in the middle ears.  CT angio head and neck WWO with CT cerebral perfusion and CT venogram head 08/27/2020: No large vessel arterial occlusion or hemodynamically significant stenosis. Perfusion imaging demonstrates no evidence of core infarction or penumbra. Suspect persistent nonocclusive thrombus within the proximal internal jugular veins bilaterally.  Assessment: 87 y.o. male with PMHx of HTN, DM2, hypothyroidism, Bell's palsy with residual right facial weakness, and recent admission with dx of pseudomonas infection with osteomyelitis of the skull with severe bilateral otomastoiditis complicated by bilateral venous sinus thrombosis, sigmoid sinus, and jugular vein on home IV antibiotics who presented to the ED 08/27/2020 via EMS for evaluation of confusion.  - Examination  reveals patient without useful verbalization besides the word "ouch" which is not consistent with patient's baseline per daughter at bedside. Patient does not have any significant extremity asymmetry noted. Patient lost his hearing prior to admission in May of 2022 without improvement in hearing since.  - CTH was without acute intracranial findings with an ASPECTS of 10. Further imaging revels patient without LVO or hemodynamically significant stenosis, CT cerebral perfusion without core infarction or penumbra, and suspected persistent nonocclusive thrombus within the proximal internal jugular veins bilaterally.  - Presentation is most consistent with acute encephalopathy with unclear etiology. Etiology may be toxic-metabolic or infectious. Other Ddx that are felt to be much  less likely include small stroke or seizure without history of seizures and CT imaging without evidence of stroke or hemodynamically significant stenosis.   Impression: Acute encephalopathy; confusion- etiology unclear  Recommendations: - Code Stroke Cancelled  - Obtain MRI brain WO contrast when able - Routine EEG  - Maintain systolic blood pressure < 180  - Infectious work up with UA and Chest x-ray   Lanae BoastStevi Toberman, AGAC-NP [facility] Pager: [phone]  Attending Neurohospitalist Addendum Patient seen and examined with APP/Resident. Agree with the history and physical as documented above. Agree with the plan as documented, which I helped formulate. I have independently reviewed the chart, obtained history, review of systems and examined the patient.I have personally reviewed pertinent head/neck/spine imaging (CT/MRI).  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 Very difficult  to get a clear last known well but he has been unwell since Friday, having multiple episodes of throwing up, difficult communication due to severe hearing loss has impaired direct history taking but according to the daughter who is a very good historian, multiple episodes of feeling unwell and throwing up since Friday leading up to today when he just was not acting like himself-usually is able to dress himself and take the stairs down uses walker both upstairs and downstairs and uses support by the stair rails to walk up and down but was unable to do that today.  Was sitting at his bed, changed in shape but unable to respond to questions.  Also came in hypotensive-systolic blood pressure in the 200 range which came down to 140s without treatment. No witnessed seizure activity.  No focal weakness noted by daughter.  Due to the facial weakness, (which after detailed history taking appears to be chronic), and confusion-code stroke was activated. Unable to perform review of systems due to his mentation. Past medical history as documented above in the chart Social history reviewed and confirmed as above  On my examination he is awake, alert, does not appear to be verbal, does not follow commands, does not have the gaze deviation or preference, left lower facial weakness and upper facial weakness can be seen at rest and upon him trying to move those muscles.  No drift in bilateral upper extremities.  Both lower extremities are barely 3/5 but nonfocal.  Grimaces to noxious stimulation in all fours. Noncontrasted head CT with no acute changes-personally reviewed CT angiography head and neck and CT perfusion study also unremarkable for emergent LVO. CT venogram with the persistent nonocclusive thrombus in bilateral proximal internal jugular veins. Laboratory findings: Labs reported after the code stroke process was run revealed acute derangement in his renal function, hyperkalemia Creatinine has jumped to 4.76  from normal.  Impression Most likely toxic metabolic encephalopathy in the setting of acute renal dysfunction   Recommendations MRI brain without contrast when able to EEG Urinalysis Chest x-ray Management of the acute renal dysfunction per primary team I anticipate he will show improvement once his metabolic derangements are fixed. I will continue his Eliquis as long as he is taking p.o. medications otherwise this can be converted to heparin drip and then switch back to a DOAC when he is okay to do p.o. medicines. Please call neurology if the EEG or MRI is concerning for an acute finding.  Discussed my plan with the ED  provider Dr. Durwin Nora    Addendum Given the systolic acute renal dysfunction, do not feel strongly that an EEG is indicated to evaluate for seizure as the renal function derangement can explain his current status. Will cancel the EEG order.     -- Milon Dikes, MD Neurologist [facility] Pager: [phone]  CRITICAL CARE ATTESTATION Performed by: Milon Dikes, MD Total critical care time: 60 minutes Critical care time was exclusive of separately billable procedures and treating other patients and/or supervising APPs/Residents/Students Critical care was necessary to treat or prevent imminent or life-threatening deterioration due to acute toxic metabolic encephalopathy This patient is critically ill and at significant risk for neurological worsening and/or death and care requires constant monitoring. Critical care was time spent personally by me on the following activities: development of treatment plan with patient and/or surrogate as well as nursing, discussions with consultants, evaluation of patient's response to treatment, examination of patient, obtaining history from patient or surrogate, ordering and performing treatments and interventions, ordering and review of laboratory studies, ordering and review of radiographic studies, pulse oximetry,  re-evaluation of patient's condition, participation in multidisciplinary rounds and medical decision making of high complexity in the care of this patient.

## 2020-08-27 NOTE — Progress Notes (Signed)
[facility] progress note  -went into room to start H&P and patient was not there. He had gone to bathroom, despite being told to stay in bed due to confusion.   -his blood pressure was still >200 systolic and was previously on cleviprex infusion that was stopped and per previous ER physician was stable enough to stay off drip and go to floor.  -potassium was 7.3 with no repeat.   ER physician came in and we discussed he was not stable enough for me to admit at this point and would need to go to ICU if cleviprex drip re initiated. They will page back out for admit if he is able to go to the floor after further work up completed in ER.   Orland Mustard, MD [facility].

## 2020-08-27 NOTE — H&P (Addendum)
History and Physical    Nicolas James WHQ:[id number] DOB: 12/29/1933 DOA: 08/27/2020  PCP: Farris Has, MD Consultants:  ENT: Dr. Annalee Genta, neurology: Dr. Marjory Lies, ID: Dr. Elinor Parkinson, ortho: Dr. Turner Daniels   Patient coming from:  Home - lives with his daughter Lynnea Ferrier and her husband.   Chief Complaint: altered mental status   HPI: Nicolas James is a 87 y.o. 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 At baseline he is fully functioning and ambulates with a walker. 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 History provided by his daughter. He has been receiving cefepime x 10 weeks with no issues. On Saturday he had his AM dose of cefepime then had an episode of vomiting.  Similar incident at his afternoon dose. He was able to tolerate his PM dose with no episodes of emesis. On Sunday he had another 1 episode of emesis with his AM dose of cefepime and was okay the rest of the day. Drank 80 ounces of water the rest of the day.  On Monday morning he woke up and was getting ready, but did not come down for his AM medication so her husband went to check on him.  He was sitting on the end of his bed and confused. He didn't know where he was, who his son in law was. They immediately called 911.   Prior to this weekend he has felt well and not complained of any positive ROS. Denies any vision changes, headaches, chest pain, palpitations, stomach pain, diarrhea, leg swelling. She isn't sure if his urine output has changed or if color has changed. No complaints of dysuria, urgency or frequency.   Of note his metformin was increased to BID about 1.5 months ago.  He is also on lisinopril.   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 [facility] was [facility] on 06/24/20 on long term IV abx: cefepime 2g q 8 hours and tobraDex eardrops. Also found to have venous sinus thrombosis and started on eliquis at that time.  Last MRI was 08/03/20 which showed decreased swelling and abscess related findings in the nasopharynx and skull base. Abx have been continued.    ED Course: vitals: bp: 156/83, HR: 50, RR: 15 and oxygen 100% on room air. Pertinent labs show: Ethanol <10, Hgb: 11.9, Potassium: 7.3--->3.8 Bun: 80 and Creatinine: 4.76. CTH with no acute findings, MRI brain: no acute intracranial abnormality, CT renal: no obstructive uropathy, CTA  head and neck no acute findings. Given lokelma, one dose of lasix in ER, IVF boluses, insulin and initially started on cleviprex drip. This was weaned off. Neurology was consulted. We were asked to admit.   Review of Systems: As per HPI; otherwise review of systems reviewed and negative.   Ambulatory Status:  Ambulates with walker    Past Medical History:  Diagnosis Date   Diabetes mellitus without complication (HCC)    Hypertension    Thyroid disease     Past Surgical History:  Procedure Laterality Date   JOINT REPLACEMENT Bilateral    Hips and Knees    Social History   Socioeconomic History   Marital status: Widowed    Spouse name: Not on file   Number of children: Not on file   Years  of education: Not on file   Highest education level: Not on file  Occupational History   Not on file  Tobacco Use   Smoking status: Never   Smokeless tobacco: Never  Substance and Sexual Activity   Alcohol use: Never   Drug use: Never   Sexual activity: Not on file  Other Topics Concern   Not on file  Social History Narrative   Not on file   Social Determinants of Health   Financial Resource Strain: Not on file  Food Insecurity: Not on file  Transportation Needs: Not on file  Physical Activity: Not on file  Stress: Not on file  Social Connections:  Not on file  Intimate Partner Violence: Not on file    Allergies  Allergen Reactions   Tape Other (See Comments)    STICKY TAPE PEELS SKIN OFF     History reviewed. No pertinent family history.  Prior to Admission medications   Medication Sig Start Date End Date Taking? Authorizing Provider  apixaban (ELIQUIS) 5 MG TABS tablet Take 1 tablet (5 mg total) by mouth 2 (two) times daily. 06/29/20  Yes Penumalli, Glenford Bayley, MD  ceFEPime (MAXIPIME) IVPB Inject 2 g into the vein 3 (three) times daily. 8AM,  3PM,  10PM   Yes [provider]  cholecalciferol (VITAMIN D) 25 MCG (1000 UNIT) tablet Take 1,000 Units by mouth daily.   Yes [provider]  insulin detemir (LEVEMIR) 100 UNIT/ML injection Inject 0-10 Units into the skin See admin instructions. Check blood sugar at BEDTIME and administer according to Sliding Scale: If BS is <70 initiate HYPOglycemia Guidelines, 70-150=0u, 151-200=1u, 201-250=2u, 251-300=3u, 301-350=4u, 351-400=6u, >400=10u and notify provider. Repeat POC blood  sugar check in 2 hours. Continue to repeat 10u and POC blood sugar check every 2 hours until blood glucose is less the /dL. Once BS is 300mg /dL, repeat blood sugar in 4 hours, then resume normal POC BS check   Yes [provider]  insulin regular (NOVOLIN R) 100 units/mL injection Inject 1-10 Units into the skin See admin instructions. Per sliding scale 3 times daily with meals 70-150=0 units 151-200=1 units 201-250=2 units  251-300=3 units 301-350=4 units 351-400=6 units Greater than 400=10 units   Yes [provider]  levothyroxine (SYNTHROID) 88 MCG tablet Take 88 mcg by mouth daily.   Yes [provider]  lisinopril (ZESTRIL) 10 MG tablet Take 1 tablet (10 mg total) by mouth at bedtime. 06/24/20  Yes Hongalgi, Maximino Greenland, MD  metFORMIN (GLUCOPHAGE) 850 MG tablet Take 850 mg by mouth 2 (two) times daily with a meal.   Yes [provider]  simvastatin (ZOCOR) 40 MG  tablet Take 40 mg by mouth at bedtime.   Yes [provider]  timolol (BETIMOL) 0.5 % ophthalmic solution Place 1 drop into the right eye 2 (two) times daily.   Yes [provider]  tobramycin-dexamethasone (TOBRADEX) ophthalmic solution Place 4 drops into both ears 2 (two) times daily. 06/24/20  Yes Hongalgi, Maximino Greenland, MD  vitamin B-12 (CYANOCOBALAMIN) 1000 MCG tablet Take 1,000 mcg by mouth daily.   Yes [provider]    Physical Exam: Vitals:   08/27/20 1500 08/27/20 1713 08/27/20 1829 08/27/20 2011  BP: (!) 163/75 (!) 188/87 (!) 164/78   Pulse:  (!) 54 (!) 56   Resp: 15 12    Temp: (!) 97.5 F (36.4 C)     TempSrc: Oral     SpO2:  99% 99%   Weight:  78.9 kg     General:  Appears calm and comfortable and is in NAD Eyes:  PERRL, EOMI, normal lids, iris ENT:  DEAF lips & tongue, mmm; appropriate dentition Neck:  no LAD, masses or thyromegaly; no carotid bruits Cardiovascular:  sinus bradycardia,  no m/r/g. Trace bilateral extremity edema.  Respiratory:   CTA bilaterally with no wheezes/rales/rhonchi.  Normal respiratory effort. Abdomen:  soft, NT, ND, NABS Back:   normal alignment, no CVAT Skin:  no rash or induration seen on limited exam Musculoskeletal:  grossly normal tone BUE/BLE, good ROM, no bony abnormality Lower extremity:  Limited foot exam with no ulcerations.  2+ distal pulses. Psychiatric:   deaf and did not talk. Confused and not oriented  Neurologic:  could  not test, but moves eyes. Slight left mouth droop that is chronic.  moves all extremities (not on command)     Radiological Exams on Admission: Independently reviewed - see discussion in A/P where applicable  MR BRAIN WO CONTRAST  Result Date: 08/27/2020 CLINICAL DATA:  Neuro deficit.  Stroke suspected. EXAM: MRI HEAD WITHOUT CONTRAST TECHNIQUE: Multiplanar, multiecho pulse sequences of the brain and surrounding structures were obtained without intravenous contrast. COMPARISON:  CT  head without contrast 08/27/2020. CT a head and neck and CT perfusion 08/27/2020. MR head without contrast 08/01/2020 FINDINGS: Brain: No acute infarct, hemorrhage, or mass lesion is present. The ventricles are of normal size. Mild atrophy and minimal white matter changes are within normal limits for age. The ventricles are of normal size. No significant extraaxial fluid collection is present. The internal auditory canals are within normal limits. 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 Cerebellum otherwise within normal limits. Vascular: Flow is present in the major intracranial arteries. Skull and upper cervical spine: Nasopharyngeal soft tissue prominence slightly improved from prior study. Heterogeneous signal changes again noted in the clivus and dens. Craniocervical junction otherwise within normal limits. Midline structures otherwise unremarkable. Sinuses/Orbits: Bilateral mastoid effusions are again noted. No obstructing nasopharyngeal lesion is evident. Mild mucosal thickening is present in the floor of the maxillary sinuses bilaterally. The paranasal sinuses and mastoid air cells are otherwise clear. Bilateral lens replacements are noted. Globes and orbits are otherwise unremarkable. IMPRESSION: 1. No acute intracranial abnormality. 2. Normal MRI appearance of the brain for age. 3. Bilateral mastoid effusions. 4. Nasopharyngeal soft tissue prominence slightly improved from prior study. Findings consistent with improving known skull base infection. Electronically Signed   By: Marin Roberts M.D.   On: 08/27/2020 16:51   US RENAL  Result Date: 08/27/2020 CLINICAL DATA:  Acute kidney injury EXAM: RENAL / URINARY TRACT ULTRASOUND COMPLETE COMPARISON:  CT 08/27/2020 FINDINGS: Right Kidney: Renal measurements: 10.9 x 6.8 x 6.3 cm = volume: 245.8 mL. Cortex is echogenic. No hydronephrosis. Trace perinephric fluid. Multiple cysts. Septated cyst in the mid right kidney measuring 3.1 x  3.3 x 2.4 cm. Cyst in the midpole measuring 2.5 x 1.9 x 1.7 cm. Left Kidney: Renal measurements: 12.4 x 5.9 x 6 cm = volume: 227.3 mL. Cortex is echogenic. No hydronephrosis. Multiple cysts, greater than 10. Septated cyst in the midpole measuring 2.5 x 2.2 x 1.9 cm. Cyst at the upper pole measuring 1.4 x 1.3 x 1.1 cm. Trace perinephric fluid. Bladder: Appears normal for degree of bladder distention. Other: Enlarged prostate. IMPRESSION: 1. Echogenic kidneys consistent with medical renal disease. No hydronephrosis. 2. Bilateral renal cysts 3. Enlarged prostate Electronically Signed   By: Adrian Prows.D.  On: 08/27/2020 19:56   CT CEREBRAL PERFUSION W CONTRAST  Result Date: 08/27/2020 CLINICAL DATA:  Neuro deficit, acute, stroke suspected EXAM: CT ANGIOGRAPHY HEAD AND NECK CT VENOGRAM HEAD CT PERFUSION BRAIN TECHNIQUE: Multidetector CT imaging of the head and neck was performed using the standard protocol during bolus administration of intravenous contrast. Multiplanar CT image reconstructions and MIPs were obtained to evaluate the vascular anatomy. Carotid stenosis measurements (when applicable) are obtained utilizing NASCET criteria, using the distal internal carotid diameter as the denominator. Delayed multidetector CT imaging of the head was performed following the administration of intravenous contrast. Multiplanar CT image reconstructions and MIPS were obtained to evaluate the venous anatomy. Multiphase CT imaging of the brain was performed following IV bolus contrast injection. Subsequent parametric perfusion maps were calculated using RAPID software. CONTRAST:  OMNIPAQUE IOHEXOL 350 MG/ML SOLN COMPARISON:  Prior CT and MR imaging FINDINGS: CTV HEAD Superior sagittal sinus, straight sinus, vein of Galen, and internal cerebral veins are patent. Bilateral transverse and sigmoid sinuses are patent. There is suspected persistent nonocclusive thrombus within the proximal internal jugular veins. CTA NECK  Aortic arch: Mild mixed plaque along the arch. Great vessel origins are patent. Right carotid system: Patent. Mild calcified plaque along the common carotid. Mixed plaque at the ICA origin without stenosis. Left carotid system: Patent. Focal noncalcified plaque along the common carotid causing less than 50% stenosis. Calcified plaque along proximal left ICA without stenosis. Vertebral arteries: Patent. Left vertebral artery is slightly dominant. No stenosis. Skeleton: Cervical spine degenerative changes. Skull base irregularity likely related to prior osteomyelitis. Other neck: No evidence of progressive infection. Upper chest: Included upper lungs are clear. Review of the MIP images confirms the above findings CTA HEAD Anterior circulation: Intracranial internal carotid arteries are patent with calcified plaque but no significant stenosis. Anterior and middle cerebral arteries are patent. Posterior circulation: Intracranial vertebral arteries are patent. There is calcified plaque along the left vertebral artery with mild stenosis. Basilar artery is patent. Major cerebellar artery origins are patent. Posterior cerebral arteries are patent. Review of the MIP images confirms the above findings CT Brain Perfusion Findings: CBF (<30%) Volume: 63mL Perfusion (Tmax>6.0s) volume: 70mL Mismatch Volume: 44mL Infarction Location: None. IMPRESSION: No large vessel arterial occlusion or hemodynamically significant stenosis. Perfusion imaging demonstrates no evidence of core infarction or penumbra. Suspect persistent nonocclusive thrombus within the proximal internal jugular veins bilaterally. Electronically Signed   By: Guadlupe Spanish M.D.   On: 08/27/2020 11:24   DG Chest Portable 1 View  Result Date: 08/27/2020 CLINICAL DATA:  Confused, found last night holding head, LEFT facial drooping and confusion today EXAM: PORTABLE CHEST 1 VIEW COMPARISON:  Portable exam 1207 hours compared to 06/19/2020 FINDINGS: Borderline  enlargement of cardiac silhouette. Mediastinal contours and pulmonary vascularity normal. Atherosclerotic calcification aorta. Tip of RIGHT arm PICC line projects over SVC. Lungs clear. No infiltrate, pleural effusion, or pneumothorax. Bones demineralized. IMPRESSION: No acute abnormalities. Aortic Atherosclerosis (ICD10-I70.0). Electronically Signed   By: Ulyses Southward M.D.   On: 08/27/2020 12:39   CT Renal Stone Study  Result Date: 08/27/2020 CLINICAL DATA:  Acute renal failure EXAM: CT ABDOMEN AND PELVIS WITHOUT CONTRAST TECHNIQUE: Multidetector CT imaging of the abdomen and pelvis was performed following the standard protocol without IV contrast. COMPARISON:  None. FINDINGS: Lower chest: Trace bilateral pleural effusions with associated bibasilar opacities, likely atelectasis. Heart size is upper limits of normal. Trace pericardial effusion. Atherosclerotic calcifications of the coronary arteries. Calcified right hilar lymph nodes and right  basilar granuloma. Hepatobiliary: Unremarkable unenhanced appearance of the liver. No focal liver lesion identified. Gallbladder within normal limits. No hyperdense gallstone. No biliary dilatation. Pancreas: Unremarkable. No pancreatic ductal dilatation or surrounding inflammatory changes. Spleen: Normal in size without focal abnormality. Adrenals/Urinary Tract: Unremarkable adrenal glands. There are multiple bilateral renal cysts, the largest within the upper pole of the right kidney measuring up to 4.0 cm. Additional bilateral subcentimeter renal lesions are too small to definitively characterize, but most likely also represent cysts. No renal stone or hydronephrosis. Nonspecific bilateral perinephric stranding. There is excreted contrast within the urinary bladder. The bladder base is obscured by artifact from patient's hip prostheses. Stomach/Bowel: Stomach is within normal limits. Appendix not definitively visualized. No evidence of bowel wall thickening, distention,  or inflammatory changes. Vascular/Lymphatic: Extensive atherosclerotic calcification throughout the aortoiliac axis. No aneurysm. No abdominopelvic lymphadenopathy. Reproductive: Obscured by artifact from hip prostheses. Other: No organized abdominopelvic fluid collection. No pneumoperitoneum. No abdominal wall hernia. Musculoskeletal: Partially visualized bilateral hip prostheses without apparent complication. Lumbosacral fusion hardware appears intact. No acute osseous findings. Mild anasarca. IMPRESSION: 1. No acute abdominopelvic findings. Specifically, no evidence of obstructive uropathy. 2.  Nonspecific bilateral perinephric stranding. 3. Trace bilateral pleural effusions with associated bibasilar opacities, likely atelectasis. 4. Trace pericardial effusion. 5. Bilateral renal cysts. Additional bilateral subcentimeter renal lesions are too small to definitively characterize, but most likely also represent cysts. Aortic Atherosclerosis (ICD10-I70.0). Electronically Signed   By: Duanne Guess D.O.   On: 08/27/2020 16:19   CT VENOGRAM HEAD  Result Date: 08/27/2020 CLINICAL DATA:  Neuro deficit, acute, stroke suspected EXAM: CT ANGIOGRAPHY HEAD AND NECK CT VENOGRAM HEAD CT PERFUSION BRAIN TECHNIQUE: Multidetector CT imaging of the head and neck was performed using the standard protocol during bolus administration of intravenous contrast. Multiplanar CT image reconstructions and MIPs were obtained to evaluate the vascular anatomy. Carotid stenosis measurements (when applicable) are obtained utilizing NASCET criteria, using the distal internal carotid diameter as the denominator. Delayed multidetector CT imaging of the head was performed following the administration of intravenous contrast. Multiplanar CT image reconstructions and MIPS were obtained to evaluate the venous anatomy. Multiphase CT imaging of the brain was performed following IV bolus contrast injection. Subsequent parametric perfusion maps were  calculated using RAPID software. CONTRAST:  OMNIPAQUE IOHEXOL 350 MG/ML SOLN COMPARISON:  Prior CT and MR imaging FINDINGS: CTV HEAD Superior sagittal sinus, straight sinus, vein of Galen, and internal cerebral veins are patent. Bilateral transverse and sigmoid sinuses are patent. There is suspected persistent nonocclusive thrombus within the proximal internal jugular veins. CTA NECK Aortic arch: Mild mixed plaque along the arch. Great vessel origins are patent. Right carotid system: Patent. Mild calcified plaque along the common carotid. Mixed plaque at the ICA origin without stenosis. Left carotid system: Patent. Focal noncalcified plaque along the common carotid causing less than 50% stenosis. Calcified plaque along proximal left ICA without stenosis. Vertebral arteries: Patent. Left vertebral artery is slightly dominant. No stenosis. Skeleton: Cervical spine degenerative changes. Skull base irregularity likely related to prior osteomyelitis. Other neck: No evidence of progressive infection. Upper chest: Included upper lungs are clear. Review of the MIP images confirms the above findings CTA HEAD Anterior circulation: Intracranial internal carotid arteries are patent with calcified plaque but no significant stenosis. Anterior and middle cerebral arteries are patent. Posterior circulation: Intracranial vertebral arteries are patent. There is calcified plaque along the left vertebral artery with mild stenosis. Basilar artery is patent. Major cerebellar artery origins are patent. Posterior cerebral  arteries are patent. Review of the MIP images confirms the above findings CT Brain Perfusion Findings: CBF (<30%) Volume: 0mL Perfusion (Tmax>6.0s) volume: 0mL Mismatch Volume: 0mL Infarction Location: None. IMPRESSION: No large vessel arterial occlusion or hemodynamically significant stenosis. Perfusion imaging demonstrates no evidence of core infarction or penumbra. Suspect persistent nonocclusive thrombus within  the proximal internal jugular veins bilaterally. Electronically Signed   By: Guadlupe SpanishPraneil  Patel M.D.   On: 08/27/2020 11:24   CT HEAD CODE STROKE WO CONTRAST  Result Date: 08/27/2020 CLINICAL DATA:  Code stroke. Neuro deficit, acute, stroke suspected. Diabetes and hypertension. EXAM: CT HEAD WITHOUT CONTRAST TECHNIQUE: Contiguous axial images were obtained from the base of the skull through the vertex without intravenous contrast. COMPARISON:  MRI 08/01/2020 FINDINGS: Brain: Age related atrophy. No sign of acute infarction, mass lesion, hemorrhage, hydrocephalus or extra-axial collection. Vascular: There is atherosclerotic calcification of the major vessels at the base of the brain. Skull: No evidence gross destructive change of the skull base. See prior imaging. Sinuses/Orbits: Mucosal thickening of the maxillary sinuses. Persistent bilateral mastoid effusions, improved since the previous imaging. There is no longer any fluid in the middle ears. Other: None ASPECTS ([place] Stroke Program Early CT Score) - Ganglionic level infarction (caudate, lentiform nuclei, internal capsule, insula, M1-M3 cortex): 7 - Supraganglionic infarction (M4-M6 cortex): 3 Total score (0-10 with 10 being normal): 10 IMPRESSION: 1. No acute or focal brain finding. 2. ASPECTS is 10 3. Less fluid in the mastoid air cells. Resolution of fluid in the middle ears. 4. These results were communicated to Dr. Wilford CornerArora at 10:37 am on 08/27/2020 by text page via the [facility] messaging system. Electronically Signed   By: Paulina FusiMark  Shogry M.D.   On: 08/27/2020 10:39   CT ANGIO HEAD NECK W WO CM (CODE STROKE)  Result Date: 08/27/2020 CLINICAL DATA:  Neuro deficit, acute, stroke suspected EXAM: CT ANGIOGRAPHY HEAD AND NECK CT VENOGRAM HEAD CT PERFUSION BRAIN TECHNIQUE: Multidetector CT imaging of the head and neck was performed using the standard protocol during bolus administration of intravenous contrast. Multiplanar CT image reconstructions and MIPs were  obtained to evaluate the vascular anatomy. Carotid stenosis measurements (when applicable) are obtained utilizing NASCET criteria, using the distal internal carotid diameter as the denominator. Delayed multidetector CT imaging of the head was performed following the administration of intravenous contrast. Multiplanar CT image reconstructions and MIPS were obtained to evaluate the venous anatomy. Multiphase CT imaging of the brain was performed following IV bolus contrast injection. Subsequent parametric perfusion maps were calculated using RAPID software. CONTRAST:  115mL OMNIPAQUE IOHEXOL 350 MG/ML SOLN COMPARISON:  Prior CT and MR imaging FINDINGS: CTV HEAD Superior sagittal sinus, straight sinus, vein of Galen, and internal cerebral veins are patent. Bilateral transverse and sigmoid sinuses are patent. There is suspected persistent nonocclusive thrombus within the proximal internal jugular veins. CTA NECK Aortic arch: Mild mixed plaque along the arch. Great vessel origins are patent. Right carotid system: Patent. Mild calcified plaque along the common carotid. Mixed plaque at the ICA origin without stenosis. Left carotid system: Patent. Focal noncalcified plaque along the common carotid causing less than 50% stenosis. Calcified plaque along proximal left ICA without stenosis. Vertebral arteries: Patent. Left vertebral artery is slightly dominant. No stenosis. Skeleton: Cervical spine degenerative changes. Skull base irregularity likely related to prior osteomyelitis. Other neck: No evidence of progressive infection. Upper chest: Included upper lungs are clear. Review of the MIP images confirms the above findings CTA HEAD Anterior circulation: Intracranial internal carotid arteries  are patent with calcified plaque but no significant stenosis. Anterior and middle cerebral arteries are patent. Posterior circulation: Intracranial vertebral arteries are patent. There is calcified plaque along the left vertebral artery  with mild stenosis. Basilar artery is patent. Major cerebellar artery origins are patent. Posterior cerebral arteries are patent. Review of the MIP images confirms the above findings CT Brain Perfusion Findings: CBF (<30%) Volume: 74mL Perfusion (Tmax>6.0s) volume: 59mL Mismatch Volume: 45mL Infarction Location: None. IMPRESSION: No large vessel arterial occlusion or hemodynamically significant stenosis. Perfusion imaging demonstrates no evidence of core infarction or penumbra. Suspect persistent nonocclusive thrombus within the proximal internal jugular veins bilaterally. Electronically Signed   By: Guadlupe Spanish M.D.   On: 08/27/2020 11:24    EKG: Independently reviewed.  NSR with rate 65 with PVC.  nonspecific ST changes with no evidence of acute ischemia. Borderline qt prolongation    Labs on Admission: I have personally reviewed the available labs and imaging studies at the time of the admission.  Pertinent labs:  Ethanol <10 Hgb: 11.9 Potassium: 7.3--->3.8 Bun: 80 Creatinine: 4.76    Assessment/Plan Principal Problem:   Encephalopathy acute Likely secondary to uremia from his AKI. See plan below Neurology was initially consulted, but code stroke cancelled and recommended MRI and EEG.  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 No white count or fevers, CXR wnl and urine without obvious infection  Metabolic labs pending  HTN urgency that could also be contributing. Neurology recommended SBP < 180, was originally placed on cleviprex drip, but was weaned off of this and has been maintaining his BP (manually taken) <180.    Active Problems:  AKI (acute kidney injury) [facility]) Nephrology consulted and appreciated.  Pre vs. Intra renal insult. No post obstructive findings Renal US pending  Stopping all nephrotoxic drugs (metformin and lisinopril and eliquis) and will renally dose his cefepime Gentle IVF overnight and see how  he responds. No signs of overload  Urine studies and further work up per nephrology Heparin drip in case of need for intervention/biopsy  Closely follow electrolytes and continue on telemetry  Strict I/O and daily weights    Hypertensive urgency Questionable if secondary to AKI Holding home lisinopril and on PRN hydralazine Will start him on 5mg  daily norvasc  Adjust meds as needed  Asked for manual pressures by nurses     Hyperkalemia No changes on EKG  Likely Secondary to AKI.  Given lokelma in ER with repeat potassium in normal range.  Will continue to watch closely with renal function and keep him on telemetry.    Osteomyelitis of skull (HCC) Renally dosed cefepime per pharmacy ID will need to be consulted tomorrow His TobraDex ear drops he has been taking BID, but reading his ENT note, it stated to take only at night. Will need to verify with daughter and ID.     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    Body mass index is 25.7 kg/m.    Level of care: Telemetry Medical DVT prophylaxis:  heparin drip Code Status: DNR- confirmed with patient Family Communication: daughter at bedside  Disposition Plan:  The patient is from: home  Anticipated d/c is to: home  Requires inpatient hospitalization and is at significant risk of neurological worsening, medical decompensation that requires constant monitoring, assessment and MDM with specialists.  Patient is currently: acutely ill Consults called: nephrology   Admission status:  observation    Orland Mustard MD [facility]   How to contact the [facility] Attending or Consulting provider 7A - 7P or covering provider during after  hours 7P -7A, for this patient?  Check the care team in [facility] and look for a) attending/consulting [facility] provider listed and b) the [facility] team listed Log into [email or web address] and use [facility] universal password to access. If you do not have the password, please contact the hospital operator. Locate the [facility] provider you are looking for under [facility] and page to a number that you can be directly reached. If you still have difficulty reaching the provider, please page the [facility] (Director on Call) for the Hospitalists listed on amion for assistance.   08/27/2020, 8:48 PM

## 2020-08-27 NOTE — ED Notes (Signed)
Pt was seen pushing against his daughter to get out of the doorway into the hallway with his gown falling off. Daughter said he needs to use the bathroom & is refusing to use the urinal. A wheel chair was nowhere in sight but a walker was found, pt used walker to get to the nearest bathroom. Daughter & staff waiting at/in bathroom for pt to finish. Wheelchair was found & is sitting outside of bathroom door.

## 2020-08-27 NOTE — ED Notes (Signed)
CRITICAL VALUE STICKER  CRITICAL VALUE:k+ 7.3   DATE & TIME NOTIFIED: 1130 08/27/20  MESSENGER (representative from lab):  MD NOTIFIED: [place] TIME OF NOTIFICATION: 1132

## 2020-08-27 NOTE — Code Documentation (Signed)
Stroke Response Nurse Documentation Code Documentation  Sequan Auxier is a 87 y.o. male arriving to [facility] ED via [facility] on 08/27/2020 with past medical hx of Osteomylitis, Diabetes, Hypertension, Venous Thrombosis. Code stroke was activated by ED after patient noted to have left facial droop and confusion. Patient from home where he was LKW at 08/26/2020 2230 when he went to bed. Patient woke up this morning, and he did not come downstairs per his usual. Daughter came to his room and found him confused on the side of the bed not able to communicate. Daughter reports that patient seemed confused on Friday evening. On Saturday, pt had vomiting and unable to keep meds down. He communicates with written board due to hearing issues and unable to understand simple phrases. On Sunday, his vomiting subsided and he returned to baseline. Went to bed at baseline. On Eliquis (apixaban) daily last dose yesterday.    Stroke team at the bedside on patient activation. Labs drawn and patient cleared for CT by Dr. Durwin Nora. Patient to CT with team. NIHSS 13, see documentation for details and code stroke times. Patient with disoriented, not following commands, left facial droop, bilateral leg weakness, Global aphasia , and dysarthria  on exam. The following imaging was completed:  CT, CTA head and neck, CTP. Patient is not a candidate for tPA due to being outside window.   Care/Plan: q4 NIHSS. BP < 180, Plan MRI and EEG. Bedside handoff with ED RN Marcello Moores.    Lucila Maine  Stroke Response RN

## 2020-08-27 NOTE — ED Notes (Signed)
EDP at bedside reported to call Code Stroke on this pt.

## 2020-08-28 DIAGNOSIS — I160 Hypertensive urgency: Secondary | ICD-10-CM | POA: Diagnosis present

## 2020-08-28 DIAGNOSIS — G9340 Encephalopathy, unspecified: Secondary | ICD-10-CM

## 2020-08-28 DIAGNOSIS — B965 Pseudomonas (aeruginosa) (mallei) (pseudomallei) as the cause of diseases classified elsewhere: Secondary | ICD-10-CM | POA: Diagnosis present

## 2020-08-28 DIAGNOSIS — Z66 Do not resuscitate: Secondary | ICD-10-CM | POA: Diagnosis present

## 2020-08-28 DIAGNOSIS — N179 Acute kidney failure, unspecified: Secondary | ICD-10-CM | POA: Diagnosis present

## 2020-08-28 DIAGNOSIS — E1169 Type 2 diabetes mellitus with other specified complication: Secondary | ICD-10-CM | POA: Diagnosis present

## 2020-08-28 DIAGNOSIS — Z7901 Long term (current) use of anticoagulants: Secondary | ICD-10-CM | POA: Diagnosis not present

## 2020-08-28 DIAGNOSIS — Z1623 Resistance to quinolones and fluoroquinolones: Secondary | ICD-10-CM | POA: Diagnosis present

## 2020-08-28 DIAGNOSIS — R627 Adult failure to thrive: Secondary | ICD-10-CM | POA: Diagnosis present

## 2020-08-28 DIAGNOSIS — T361X5A Adverse effect of cephalosporins and other beta-lactam antibiotics, initial encounter: Secondary | ICD-10-CM | POA: Diagnosis present

## 2020-08-28 DIAGNOSIS — H9190 Unspecified hearing loss, unspecified ear: Secondary | ICD-10-CM | POA: Diagnosis present

## 2020-08-28 DIAGNOSIS — Z794 Long term (current) use of insulin: Secondary | ICD-10-CM | POA: Diagnosis not present

## 2020-08-28 DIAGNOSIS — G9341 Metabolic encephalopathy: Secondary | ICD-10-CM | POA: Diagnosis present

## 2020-08-28 DIAGNOSIS — M4622 Osteomyelitis of vertebra, cervical region: Secondary | ICD-10-CM | POA: Diagnosis present

## 2020-08-28 DIAGNOSIS — M8668 Other chronic osteomyelitis, other site: Secondary | ICD-10-CM | POA: Diagnosis present

## 2020-08-28 DIAGNOSIS — M869 Osteomyelitis, unspecified: Secondary | ICD-10-CM | POA: Diagnosis not present

## 2020-08-28 DIAGNOSIS — R809 Proteinuria, unspecified: Secondary | ICD-10-CM | POA: Diagnosis present

## 2020-08-28 DIAGNOSIS — I82C23 Chronic embolism and thrombosis of internal jugular vein, bilateral: Secondary | ICD-10-CM | POA: Diagnosis present

## 2020-08-28 DIAGNOSIS — G08 Intracranial and intraspinal phlebitis and thrombophlebitis: Secondary | ICD-10-CM | POA: Diagnosis present

## 2020-08-28 DIAGNOSIS — Z95828 Presence of other vascular implants and grafts: Secondary | ICD-10-CM | POA: Diagnosis not present

## 2020-08-28 DIAGNOSIS — G510 Bell's palsy: Secondary | ICD-10-CM | POA: Diagnosis present

## 2020-08-28 DIAGNOSIS — H7093 Unspecified mastoiditis, bilateral: Secondary | ICD-10-CM | POA: Diagnosis present

## 2020-08-28 DIAGNOSIS — E039 Hypothyroidism, unspecified: Secondary | ICD-10-CM | POA: Diagnosis present

## 2020-08-28 DIAGNOSIS — Z20822 Contact with and (suspected) exposure to covid-19: Secondary | ICD-10-CM | POA: Diagnosis present

## 2020-08-28 DIAGNOSIS — I10 Essential (primary) hypertension: Secondary | ICD-10-CM | POA: Diagnosis present

## 2020-08-28 DIAGNOSIS — E875 Hyperkalemia: Secondary | ICD-10-CM | POA: Diagnosis present

## 2020-08-28 LAB — RENAL FUNCTION PANEL
Albumin: 2.3 g/dL — ABNORMAL LOW (ref 3.5–5.0)
Anion gap: 17 — ABNORMAL HIGH (ref 5–15)
BUN: 83 mg/dL — ABNORMAL HIGH (ref 8–23)
CO2: 16 mmol/L — ABNORMAL LOW (ref 22–32)
Calcium: 9.1 mg/dL (ref 8.9–10.3)
Chloride: 100 mmol/L (ref 98–111)
Creatinine, Ser: 5.26 mg/dL — ABNORMAL HIGH (ref 0.61–1.24)
GFR, Estimated: 10 mL/min — ABNORMAL LOW (ref >60)
Glucose, Bld: 101 mg/dL — ABNORMAL HIGH (ref 70–99)
Phosphorus: 3.1 mg/dL (ref 2.5–4.6)
Potassium: 3.7 mmol/L (ref 3.5–5.1)
Sodium: 133 mmol/L — ABNORMAL LOW (ref 135–145)

## 2020-08-28 LAB — CBC
HCT: 34.3 % — ABNORMAL LOW (ref 39.0–52.0)
Hemoglobin: 11.6 g/dL — ABNORMAL LOW (ref 13.0–17.0)
MCH: 29.3 pg (ref 26.0–34.0)
MCHC: 33.8 g/dL (ref 30.0–36.0)
MCV: 86.6 fL (ref 80.0–100.0)
Platelets: 236 10*3/uL (ref 150–400)
RBC: 3.96 MIL/uL — ABNORMAL LOW (ref 4.22–5.81)
RDW: 14 % (ref 11.5–15.5)
WBC: 9.7 10*3/uL (ref 4.0–10.5)
nRBC: 0 % (ref 0.0–0.2)

## 2020-08-28 LAB — VITAMIN B12: Vitamin B-12: 748 pg/mL (ref 180–914)

## 2020-08-28 LAB — COMPREHENSIVE METABOLIC PANEL
ALT: 21 U/L (ref 0–44)
ALT: 15 U/L (ref 0–44)
AST: 51 U/L — ABNORMAL HIGH (ref 15–41)
AST: 25 U/L (ref 15–41)
Albumin: 2.4 g/dL — ABNORMAL LOW (ref 3.5–5.0)
Albumin: 2.2 g/dL — ABNORMAL LOW (ref 3.5–5.0)
Alkaline Phosphatase: 53 U/L (ref 38–126)
Alkaline Phosphatase: 56 U/L (ref 38–126)
Anion gap: 15 (ref 5–15)
Anion gap: 18 — ABNORMAL HIGH (ref 5–15)
BUN: 80 mg/dL — ABNORMAL HIGH (ref 8–23)
BUN: 81 mg/dL — ABNORMAL HIGH (ref 8–23)
CO2: 19 mmol/L — ABNORMAL LOW (ref 22–32)
CO2: 17 mmol/L — ABNORMAL LOW (ref 22–32)
Calcium: 9 mg/dL (ref 8.9–10.3)
Calcium: 8.9 mg/dL (ref 8.9–10.3)
Chloride: 98 mmol/L (ref 98–111)
Chloride: 97 mmol/L — ABNORMAL LOW (ref 98–111)
Creatinine, Ser: 4.76 mg/dL — ABNORMAL HIGH (ref 0.61–1.24)
Creatinine, Ser: 5.62 mg/dL — ABNORMAL HIGH (ref 0.61–1.24)
GFR, Estimated: 11 mL/min — ABNORMAL LOW (ref >60)
GFR, Estimated: 9 mL/min — ABNORMAL LOW (ref >60)
Glucose, Bld: 144 mg/dL — ABNORMAL HIGH (ref 70–99)
Glucose, Bld: 95 mg/dL (ref 70–99)
Potassium: 7.3 mmol/L (ref 3.5–5.1)
Potassium: 3.5 mmol/L (ref 3.5–5.1)
Sodium: 132 mmol/L — ABNORMAL LOW (ref 135–145)
Sodium: 132 mmol/L — ABNORMAL LOW (ref 135–145)
Total Bilirubin: 1.8 mg/dL — ABNORMAL HIGH (ref 0.3–1.2)
Total Bilirubin: 0.6 mg/dL (ref 0.3–1.2)
Total Protein: 5.9 g/dL — ABNORMAL LOW (ref 6.5–8.1)
Total Protein: 5.7 g/dL — ABNORMAL LOW (ref 6.5–8.1)

## 2020-08-28 LAB — APTT
aPTT: 76 seconds — ABNORMAL HIGH (ref 24–36)
aPTT: 137 seconds — ABNORMAL HIGH (ref 24–36)

## 2020-08-28 LAB — URINE CULTURE: Culture: <10000 — AB

## 2020-08-28 LAB — GLUCOSE, RANDOM: Glucose, Bld: 93 mg/dL (ref 70–99)

## 2020-08-28 LAB — TSH: TSH: 5.896 u[IU]/mL — ABNORMAL HIGH (ref 0.350–4.500)

## 2020-08-28 LAB — HEPARIN LEVEL (UNFRACTIONATED): Heparin Unfractionated: >1.1 IU/mL — ABNORMAL HIGH (ref 0.30–0.70)

## 2020-08-28 LAB — GLUCOSE, CAPILLARY
Glucose-Capillary: 124 mg/dL — ABNORMAL HIGH (ref 70–99)
Glucose-Capillary: 114 mg/dL — ABNORMAL HIGH (ref 70–99)

## 2020-08-28 LAB — HEMOGLOBIN A1C
Hgb A1c MFr Bld: 8.4 % — ABNORMAL HIGH (ref 4.8–5.6)
Mean Plasma Glucose: 194.38 mg/dL

## 2020-08-28 LAB — CBG MONITORING, ED: Glucose-Capillary: 117 mg/dL — ABNORMAL HIGH (ref 70–99)

## 2020-08-28 LAB — CK: Total CK: 37 U/L — ABNORMAL LOW (ref 49–397)

## 2020-08-28 MED ORDER — LABETALOL HCL 5 MG/ML IV SOLN: 5.0000 mg | INTRAVENOUS | Status: DC | PRN

## 2020-08-28 MED ORDER — HEPARIN (PORCINE) 25000 UT/250ML-% IV SOLN
1000.0000 [IU]/h | INTRAVENOUS | Status: DC
  Administered 2020-08-28: 1100 [IU]/h via INTRAVENOUS
  Administered 2020-08-29: 1000 [IU]/h via INTRAVENOUS
  Filled 2020-08-28 (×3): qty 250

## 2020-08-28 MED ORDER — LEVOTHYROXINE SODIUM 88 MCG PO TABS: 88.0000 ug | ORAL_TABLET | Freq: Every day | ORAL | Status: DC

## 2020-08-28 MED ORDER — SODIUM CHLORIDE 0.9% FLUSH: 10.0000 mL | INTRAVENOUS | Status: DC | PRN

## 2020-08-28 MED ORDER — SODIUM CHLORIDE 0.9 % IV SOLN: INTRAVENOUS | Status: AC

## 2020-08-28 MED ORDER — SIMVASTATIN 20 MG PO TABS
40.0000 mg | ORAL_TABLET | Freq: Every day | ORAL | Status: DC
  Filled 2020-08-28: qty 2

## 2020-08-28 MED ORDER — TIMOLOL MALEATE 0.5 % OP SOLN
1.0000 [drp] | Freq: Two times a day (BID) | OPHTHALMIC | Status: DC
  Administered 2020-08-29 – 2020-08-30 (×3): 1 [drp] via OPHTHALMIC
  Filled 2020-08-28: qty 5

## 2020-08-28 MED ORDER — CHLORHEXIDINE GLUCONATE CLOTH 2 % EX PADS
6.0000 | MEDICATED_PAD | Freq: Every day | CUTANEOUS | Status: DC
  Administered 2020-08-29 – 2020-08-30 (×2): 6 via TOPICAL

## 2020-08-28 MED ORDER — DEXTROSE 5 % IV SOLN
0.5000 g | INTRAVENOUS | Status: DC
  Filled 2020-08-28 (×2): qty 0.5

## 2020-08-28 MED ORDER — TOBRAMYCIN-DEXAMETHASONE 0.3-0.1 % OP SUSP
4.0000 [drp] | Freq: Two times a day (BID) | OPHTHALMIC | Status: DC
  Administered 2020-08-29 – 2020-08-30 (×2): 4 [drp] via OTIC
  Filled 2020-08-28: qty 2.5

## 2020-08-28 MED ORDER — SODIUM CHLORIDE 0.9 % IV SOLN: 1.0000 g | INTRAVENOUS | Status: DC

## 2020-08-28 MED ORDER — CLONIDINE HCL 0.1 MG/24HR TD PTWK
0.1000 mg | MEDICATED_PATCH | TRANSDERMAL | Status: DC
  Administered 2020-08-28: 0.1 mg via TRANSDERMAL
  Filled 2020-08-28: qty 1

## 2020-08-28 NOTE — ED Notes (Signed)
Breakfast Orders Placed °

## 2020-08-28 NOTE — Progress Notes (Signed)
[facility] Progress Note  Name: Nicolas James MRN: [id number] DOB: 1933/03/27   Subjective:  he has remained in the ER.  Spoke with his daughter at bedside.  She states that patient met with palliative last week and that he didn't want to come to the hospital.  She states that "he would never want any machines" such as dialysis and she doesn't think he would want a renal biopsy.  ID has seen pt and has changed to ceftazidime.  Had n/v sat but ate a good meal Sunday evening. On Monday, she found him sitting on the edge of the bed altered. Not talking to her and she doesn't think he knows who she is.   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 Rogers Blocker for eval and recs re: AKI.  Pt's history is obtained via chart review and history provided by his dtr.    Briefly, pt started having bilateral ear issues since August 2021- was living in [place] and was placed on multiple courses of antibiotics really without relief.  He was not doing well by himself in [place] and so was brought to [place] to be closer to family.  Started following with ENT April 2022--> ear culture 05/30/20 with fluoroquinolone- resistant Pseudomonas aeruginosa.  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  Admitted 5/30-06/24/20 for headache and n/v.  Was found to have skull base osteomyelitis along with C1-C2 osteo as well.  He was placed on cefepime 2 g IV q 8 hrs which he has been receiving at home.  He was initially doing well with this but started developing n/v and confusion since 08/24/20.  Dtr says that he vomited 2x each day but was able to overall keep down at least 32-64 oz water daily.  No NSAIDs.  No known hypotensive events.  Did receive contrast today.  He was brought to  the ED where he was found to have a Cr of 4.76 and K of 7.3. He received treatment for hyperkalemia with improvement to 3.8. He is still urinating.  Last Cr known was from OPAT labs- Cr 0.79 07/04/20.  He was seen by ID 08/09/20 where it was noted that his imaging was improving but not resolved so antibiotics were extended to end date of 09/10/20.  He is taking metformin and lisinopril 10 mg daily.   Intake/Output Summary (Last 24 hours) at 08/28/2020 1128 Last data filed at 08/28/2020 0117 Gross per 24 hour  Intake 4545.45 ml  Output --  Net 4545.45 ml    Vitals:  Vitals:   08/28/20 0615 08/28/20 0645 08/28/20 0651 08/28/20 1100  BP:   (!) 174/95 (!) 181/63  Pulse: 77 80 84 68  Resp: '10  16 15  ' Temp:   98.1 F (36.7 C) 97.6 F (36.4 C)  TempSrc:    Oral  SpO2: 100% 100% 100% 99%  Weight:         Physical Exam:  General elderly male in bed curled under covers HEENT normocephalic atraumatic tracks visually then pulls covers over eyes x 2 Neck trachea midline Lungs clear to auscultation bilaterally normal work of breathing at rest  Heart unable to auscultate 2/2 agitation but patient with normal rate on bedside  monitor Abdomen soft nontender nondistended Extremities no edema  Neuro agitated and pulls covers over face and curls to the side; nonverbal for me and report of hard of hearing  Medications reviewed   Labs:  BMP Latest Ref Rng & Units 08/28/2020 08/28/2020 08/28/2020  Glucose 70 - 99 mg/dL 95 93 101(H)  BUN 8 - 23 mg/dL 81(H) - 83(H)  Creatinine 0.61 - 1.24 mg/dL 5.62(H) - 5.26(H)  Sodium 135 - 145 mmol/L 132(L) - 133(L)  Potassium 3.5 - 5.1 mmol/L 3.5 - 3.7  Chloride 98 - 111 mmol/L 97(L) - 100  CO2 22 - 32 mmol/L 17(L) - 16(L)  Calcium 8.9 - 10.3 mg/dL 8.9 - 9.1     Assessment/Plan:    AKI/ hyperkalemia: hyperkalemia treated effectively with Lokelma, insulin/ dextrose, Lasix, calcium gluconate.  Renal US with no hydro or obstruction.  Concerns include some not-captured  hypotensive events resulting in pre-renal injury/ ATN, AIN d/t cephalosporin use.  Now also s/p contrast exposure in setting of AKI.  He was also hypertensive.  CK ok.  Up/cr ratio 3870 mg/g and UA with 100 mg/dl protein and UA with 6-10 RBC.  Cr 0.79 07/04/20 for reference  - the patient would not ever want dialysis  - continue supportive measures as align with goals of care - stop cefepime - off Eliquis, on heparin gtt  - note ANA, ANCA, anti-GBM, C3 and C4 pending             - hold metformin and lisinopril       - please try to limit further contrast exposure unless absolutely necessary  - continue overall goals of care discussion per primary team   - NS at 75 ml/hr if he will allow  2. Proteinuria may be secondary to DM. Note work-up for AKI as above as well   3.  Acute encephalopathy: infectious vs metabolic per neuro--> cultures ordered, neuro was consulted.  EEG recommended.  neurotoxicity from cefepime seems to be a possibility  4. Osteomyelitis of the skull base: ID follows as OP--> team has consulted ID.  would recommend transition to an alternate to cefepime  5.  HTN: not taking PO meds - try clonidine patch?  Added at 0.1 mg/24 hr  6.  DM II hold metformin.  A1c is 8.4  7.  Sigmoid venous thrombosis and IJ thrombosis: hold Eliquis, on hep gtt for now  Dispo: admitted.  Note he is appropriate for inpatient status   Claudia Desanctis, MD 08/28/2020 12:24 PM

## 2020-08-28 NOTE — Progress Notes (Signed)
ANTICOAGULATION CONSULT NOTE - Follow Up Consult  Pharmacy Consult for heparin Indication:  bilateral venous sinus thrombosis  Labs: Recent Labs    08/27/20 1018 08/27/20 1827 08/27/20 1833 08/28/20 0030 08/28/20 0434  HGB 11.9*  --  12.9*  --   --   HCT 34.6*  --  38.0*  --   --   PLT 218  --   --   --   --   APTT 32  --   --   --  76*  LABPROT 16.5*  --   --   --   --   INR 1.3*  --   --   --   --   HEPARINUNFRC  --   --   --   --  >1.10*  CREATININE 4.76*   < > 5.30* 5.26* 5.62*  CKTOTAL  --   --   --  37*  --    < > = values in this interval not displayed.    Assessment/Plan:  87yo male therapeutic on heparin with initial dosing while Eliquis on hold. Will continue infusion at current rate of 1300 units/hr and confirm stable with additional PTT.   Vernard Gambles, PharmD, BCPS  08/28/2020,6:26 AM

## 2020-08-28 NOTE — Consult Note (Signed)
[facility]                                                                                        Patient Identification: Patient Name: Nicolas James MRN: [id number] Admit Date: 08/27/2020 10:06 AM Today's Date: 08/28/2020 Reason for consult: skull osteomyelitis and encephalopathy  Requesting provider: Albertine Grates   Principal Problem:   Encephalopathy acute Active Problems:   Osteomyelitis of skull (HCC)   Hypertensive urgency   Insulin dependent type 2 diabetes mellitus (HCC)   Cerebral venous sinus thrombosis   AKI (acute kidney injury) (HCC)   Hyperkalemia   Antibiotics:  Cefepime prior to admit-current   Lines/Tubes: PICC rt arm   Assessment #Acute encephalopathy-most likely metabolic, in the setting of Hyperkalemia, AKI, prolonged IV cefepime # Severe bilateral otomastoiditis/skull base osteomyelitis # Bilateral sigmoid and jugular  vein thrombosis # AKI - in the setting of volume loss leading to pre-renal injury/ATN , contrast exposure. also concern for AIN due to cephalosporin use however no eosinophilia in blood and urine. [place] with medical renal disease. No obstructive uropathy in CT/   Recommendations  -Will change cefepime to ceftazidime given concern of neurotoxicity with cefepime - He was considered a poor surgical candidate for his skull osteo in his last admission and hence has been on prolonged IV abtx. Unfortunately, the Pseudomonas is R to fluoroquinolones and hence, no PO alternative available. Patient/daughter was seen by palliative last admission and had desired full scope of care with PICC line/IV abtx. I again discussed with daughter regarding uncertain time period for continuation of IV abtx and very low chances of curing of  the infection by iv abtx alone/ discussed side effects pf prolonged IV abtx - Follow up blood cultures  - AC for Cerebral venous sinus thrombosis per  Neurology  - AKI management per Nephrology  - Continue palliative care discussions regarding GOC.  - Discussed with daughter/RN/nephrology and primary  Rest of the management as per the primary team. Please call with questions or concerns.  Thank you for the consult  Odette Fraction, MD Infectious Disease Physician [facility] [address] [place], [place] [address] Phone: [phone]  Fax: [phone]  __________________________________________________________________________________________________________ HPI and Hospital Course: 87 year old male well-known to me from his past hospitalization in the setting of bilateral mastoiditis and skull base osteomyelitis including bilateral sigmoid and jugular vein thrombosis.  He also has a history of bilateral hip and knee replacement and diabetes mellitus.  He is on cefepime for his skull osteomyelitis and has completed approximately 10 weeks of cefepime by now.  Patient was brought to the ED on 8/8 of altered mental status.  Patient was also having nausea and vomiting for the 2 to 3 days with his IV cefepime.  Patient was found sitting on the edge of the bed, confused and unable to respond appropriately on the day of presentation and his daughter decided to bring him to the ED. history is obtained from daughter and chart review as patient is currently curled up in the bed and uncooperative to exam.  Daughter states patient was  doing well prior to this weekend and denies any fever, chills and sweats.  Denied any headaches, blurry vision.  Denies any new numbness weakness.  He was recently seen in the [facility] with Marcos Eke at which time MRI brain and C-spine was done on 7/14 with overall improved findings.  At ED, patient was afebrile, no leukocytosis Blood cultures no growth in less than 24 hours Urine culture insignificant growth.  UA not indicative of UTI  Labs remarkable for  potassium 7.3, creatinine 4.76, AST 51 ALT 21 Ultrasound Kidneys Suggestive of Medical Renal Disease  ROS: limited as patient is altered   Past Medical History:  Diagnosis Date   Diabetes mellitus without complication (HCC)    Hypertension    Thyroid disease    Past Surgical History:  Procedure Laterality Date   JOINT REPLACEMENT Bilateral    Hips and Knees    Scheduled Meds:  amLODipine  5 mg Oral Daily   insulin aspart  0-5 Units Subcutaneous QHS   insulin aspart  0-6 Units Subcutaneous TID WC   Continuous Infusions:  [START ON 08/29/2020] cefTAZidime (FORTAZ)  IV     heparin 1,300 Units/hr (08/28/20 0609)   PRN Meds:.acetaminophen **OR** acetaminophen, hydrALAZINE  Allergies  Allergen Reactions   Tape Other (See Comments)    STICKY TAPE PEELS SKIN OFF    Social History   Socioeconomic History   Marital status: Widowed    Spouse name: Not on file   Number of children: Not on file   Years of education: Not on file   Highest education level: Not on file  Occupational History   Not on file  Tobacco Use   Smoking status: Never   Smokeless tobacco: Never  Substance and Sexual Activity   Alcohol use: Never   Drug use: Never   Sexual activity: Not on file  Other Topics Concern   Not on file  Social History Narrative   Not on file   Social Determinants of Health   Financial Resource Strain: Not on file  Food Insecurity: Not on file  Transportation Needs: Not on file  Physical Activity: Not on file  Stress: Not on file  Social Connections: Not on file  Intimate Partner Violence: Not on file   Breast Cancer-relatedfamily history is not on file.   Vitals BP (!) 181/63 (BP Location: Left Leg)   Pulse 68   Temp 97.6 F (36.4 C) (Oral)   Resp 15   Ht 5\' 9"  (1.753 m)   Wt 78.9 kg   SpO2 99%   BMI 25.69 kg/m    Physical Exam Constitutional: Patient is curled up in the bed and covered in a blanket which is fighting to not be removed    Comments:    Cardiovascular: Uncooperative for auscultation    Rate and Rhythm:     Heart sounds:  Pulmonary:     Effort: Pulmonary effort is normal.     Comments: On room air  Abdominal:     Palpations: Abdomen is soft.     Tenderness:   Musculoskeletal:        General: No swelling or tenderness.   Skin:    Comments: No obvious lesions or rashes  Neurological:     General: Grossly nonfocal, limited exam  Psychiatric:        Mood and Affect: Altered and uncooperative to exam   Pertinent Microbiology Results for orders placed or performed during the hospital encounter of 08/27/20  Resp Panel by RT-PCR (  Flu A&B, Covid) Nasopharyngeal Swab     Status: None   Collection Time: 08/27/20 10:18 AM   Specimen: Nasopharyngeal Swab; Nasopharyngeal(NP) swabs in vial transport medium  Result Value Ref Range Status   SARS Coronavirus 2 by RT PCR NEGATIVE NEGATIVE Final    Comment: (NOTE) SARS-CoV-2 target nucleic acids are NOT DETECTED.  The SARS-CoV-2 RNA is generally detectable in upper respiratory specimens during the acute phase of infection. The lowest concentration of SARS-CoV-2 viral copies this assay can detect is 138 copies/mL. A negative result does not preclude SARS-Cov-2 infection and should not be used as the sole basis for treatment or other patient management decisions. A negative result may occur with  improper specimen collection/handling, submission of specimen other than nasopharyngeal swab, presence of viral mutation(s) within the areas targeted by this assay, and inadequate number of viral copies(<138 copies/mL). A negative result must be combined with clinical observations, patient history, and epidemiological information. The expected result is Negative.  Fact Sheet for Patients:  BloggerCourse.comhttps://www.fda.gov/media/152166/download  Fact Sheet for Healthcare Providers:  SeriousBroker.ithttps://www.fda.gov/media/152162/download  This test is no t yet approved or cleared by the [place] FDA and  has been authorized for detection and/or diagnosis of SARS-CoV-2 by FDA under an Emergency Use Authorization (EUA). This EUA will remain  in effect (meaning this test can be used) for the duration of the COVID-19 declaration under Section 564(b)(1) of the Act, 21 U.S.C.section 360bbb-3(b)(1), unless the authorization is terminated  or revoked sooner.       Influenza A by PCR NEGATIVE NEGATIVE Final   Influenza B by PCR NEGATIVE NEGATIVE Final    Comment: (NOTE) The Xpert Xpress SARS-CoV-2/FLU/RSV plus assay is intended as an aid in the diagnosis of influenza from Nasopharyngeal swab specimens and should not be used as a sole basis for treatment. Nasal washings and aspirates are unacceptable for Xpert Xpress SARS-CoV-2/FLU/RSV testing.  Fact Sheet for Patients: BloggerCourse.comhttps://www.fda.gov/media/152166/download  Fact Sheet for Healthcare Providers: SeriousBroker.ithttps://www.fda.gov/media/152162/download  This test is not yet approved or cleared by the [place] FDA and has been authorized for detection and/or diagnosis of SARS-CoV-2 by FDA under an Emergency Use Authorization (EUA). This EUA will remain in effect (meaning this test can be used) for the duration of the COVID-19 declaration under Section 564(b)(1) of the Act, 21 U.S.C. section 360bbb-3(b)(1), unless the authorization is terminated or revoked.  Performed at [facility], [address] [place], [place] [address]   Blood culture (routine x 2)     Status: None (Preliminary result)   Collection Time: 08/27/20 12:08 PM   Specimen: BLOOD RIGHT ARM  Result Value Ref Range Status   Specimen Description BLOOD RIGHT ARM  Final   Special Requests   Final    BOTTLES DRAWN AEROBIC AND ANAEROBIC Blood Culture adequate volume   Culture   Final    NO GROWTH < 24 HOURS Performed at [facility], [address] [place], [place] [address]    Report Status PENDING  Incomplete  Urine Culture     Status: Abnormal    Collection Time: 08/27/20  6:29 PM   Specimen: Urine, Clean Catch  Result Value Ref Range Status   Specimen Description URINE, CLEAN CATCH  Final   Special Requests NONE  Final   Culture (A)  Final    <10,000 COLONIES/mL INSIGNIFICANT GROWTH Performed at [facility], [address] [place], [place] [address]    Report Status 08/28/2020 FINAL  Final  Blood culture (  routine x 2)     Status: None (Preliminary result)   Collection Time: 08/27/20  7:11 PM   Specimen: BLOOD  Result Value Ref Range Status   Specimen Description BLOOD RIGHT ANTECUBITAL  Final   Special Requests   Final    BOTTLES DRAWN AEROBIC AND ANAEROBIC Blood Culture adequate volume   Culture   Final    NO GROWTH < 12 HOURS Performed at [facility], [address] [place], [place] [address]    Report Status PENDING  Incomplete    Pertinent Lab seen by me: CBC Latest Ref Rng & Units 08/28/2020 08/27/2020 08/27/2020  WBC 4.0 - 10.5 K/uL 9.7 - 6.7  Hemoglobin 13.0 - 17.0 g/dL 11.6(L) 12.9(L) 11.9(L)  Hematocrit 39.0 - 52.0 % 34.3(L) 38.0(L) 34.6(L)  Platelets 150 - 400 K/uL 236 - 218   CMP Latest Ref Rng & Units 08/28/2020 08/28/2020 08/28/2020  Glucose 70 - 99 mg/dL 95 93 989(Q)  BUN 8 - 23 mg/dL 11(H) - 41(D)  Creatinine 0.61 - 1.24 mg/dL 4.08(X) - 4.48(J)  Sodium 135 - 145 mmol/L 132(L) - 133(L)  Potassium 3.5 - 5.1 mmol/L 3.5 - 3.7  Chloride 98 - 111 mmol/L 97(L) - 100  CO2 22 - 32 mmol/L 17(L) - 16(L)  Calcium 8.9 - 10.3 mg/dL 8.9 - 9.1  Total Protein 6.5 - 8.1 g/dL 8.5(U) - -  Total Bilirubin 0.3 - 1.2 mg/dL 0.6 - -  Alkaline Phos 38 - 126 U/L 56 - -  AST 15 - 41 U/L 25 - -  ALT 0 - 44 U/L 15 - -     Pertinent Imagings/Other Imagings Plain films and CT images have been personally visualized and interpreted; radiology reports have been reviewed. Decision making incorporated into the Impression / Recommendations.  US renal 08/27/20 IMPRESSION: 1. Echogenic kidneys consistent with medical renal  disease. No hydronephrosis. 2. Bilateral renal cysts 3. Enlarged prostate  MRI brain 08/27/20 IMPRESSION: 1. No acute intracranial abnormality. 2. Normal MRI appearance of the brain for age. 3. Bilateral mastoid effusions. 4. Nasopharyngeal soft tissue prominence slightly improved from prior study. Findings consistent with improving known skull base infection.  CT renal stone study 08/27/20 IMPRESSION: 1. No acute abdominopelvic findings. Specifically, no evidence of obstructive uropathy. 2.  Nonspecific bilateral perinephric stranding. 3. Trace bilateral pleural effusions with associated bibasilar opacities, likely atelectasis. 4. Trace pericardial effusion. 5. Bilateral renal cysts. Additional bilateral subcentimeter renal lesions are too small to definitively characterize, but most likely also represent cysts.   Aortic Atherosclerosis (ICD10-I70.0).'  Chest Xray 08/27/20 IMPRESSION: No acute abnormalities.   Aortic Atherosclerosis (ICD10-I70.0).  CT cerebral perfusion 08/28/20 IMPRESSION: No large vessel arterial occlusion or hemodynamically significant stenosis.   Perfusion imaging demonstrates no evidence of core infarction or penumbra.   Suspect persistent nonocclusive thrombus within the proximal internal jugular veins bilaterally.  CT venogram 08/27/20 IMPRESSION: No large vessel arterial occlusion or hemodynamically significant stenosis.   Perfusion imaging demonstrates no evidence of core infarction or penumbra.   Suspect persistent nonocclusive thrombus within the proximal internal jugular veins bilaterally.  CT angio head/neck 08/27/20 IMPRESSION: No large vessel arterial occlusion or hemodynamically significant stenosis.   Perfusion imaging demonstrates no evidence of core infarction or penumbra.   Suspect persistent nonocclusive thrombus within the proximal internal jugular veins bilaterally.  CT head 08/27/20 IMPRESSION: 1. No acute or focal brain  finding. 2. ASPECTS is 10 3. Less fluid in the mastoid air cells. Resolution of fluid in the middle ears.  MRI brain 08/02/20 IMPRESSION: 1. Improving soft tissue swelling and enhancement below the skull base compatible with improving infection. Posterior retropharyngeal abscess has improved. 2. Probable infection at C1-2 has improved. No new areas of infection or abscess in the cervical spine.  MRI C spine 08/01/20 IMPRESSION: 1. Improving soft tissue swelling and enhancement below the skull base compatible with improving infection. Posterior retropharyngeal abscess has improved. 2. Probable infection at C1-2 has improved. No new areas of infection or abscess in the cervical spine.  I spent more than 70  minutes for this patient encounter including review of prior medical records/discussing diagnostics and treatment plan with the patient/family/coordinate care with primary/other specialits with greater than 50% of time in face to face encounter.   Electronically signed by:   Odette Fraction, MD Infectious Disease Physician [facility] Pager: [phone]

## 2020-08-28 NOTE — Progress Notes (Signed)
Pt. Received from ED, pt. Confused, non verbal, refusing care and tele monitor. Will continue to assess and monitor.

## 2020-08-28 NOTE — Progress Notes (Signed)
ANTICOAGULATION CONSULT NOTE - Initial Consult  Pharmacy Consult for Heparin Indication:  bilateral venous sinus thrombosis, sigmoid sinus and jugular vein  Allergies  Allergen Reactions   Tape Other (See Comments)    STICKY TAPE PEELS SKIN OFF     Patient Measurements: Height: 5\' 9"  (175.3 cm) Weight: 78.9 kg (173 lb 15.1 oz) IBW/kg (Calculated) : 70.7 Heparin Dosing Weight: 70.7 kg  Vital Signs: Temp: 97.6 F (36.4 C) (08/09 1100) Temp Source: Oral (08/09 1100) BP: 181/63 (08/09 1100) Pulse Rate: 68 (08/09 1100)  Labs: Recent Labs    08/27/20 1018 08/27/20 1827 08/27/20 1833 08/28/20 0030 08/28/20 0434 08/28/20 1300  HGB 11.9*  --  12.9*  --  11.6*  --   HCT 34.6*  --  38.0*  --  34.3*  --   PLT 218  --   --   --  236  --   APTT 32  --   --   --  76* 137*  LABPROT 16.5*  --   --   --   --   --   INR 1.3*  --   --   --   --   --   HEPARINUNFRC  --   --   --   --  >1.10*  --   CREATININE 4.76*   < > 5.30* 5.26* 5.62*  --   CKTOTAL  --   --   --  37*  --   --    < > = values in this interval not displayed.     Estimated Creatinine Clearance: 9.6 mL/min (A) (by C-G formula based on SCr of 5.62 mg/dL (H)).   Medical History: Past Medical History:  Diagnosis Date   Diabetes mellitus without complication (HCC)    Hypertension    Thyroid disease     Medications:  Medications Prior to Admission  Medication Sig Dispense Refill Last Dose   apixaban (ELIQUIS) 5 MG TABS tablet Take 1 tablet (5 mg total) by mouth 2 (two) times daily. 180 tablet 4 08/26/2020 at 1900   ceFEPime (MAXIPIME) IVPB Inject 2 g into the vein 3 (three) times daily. 8AM,  3PM,  10PM   08/27/2020 at 0800   cholecalciferol (VITAMIN D) 25 MCG (1000 UNIT) tablet Take 1,000 Units by mouth daily.   08/26/2020   insulin detemir (LEVEMIR) 100 UNIT/ML injection Inject 0-10 Units into the skin See admin instructions. Check blood sugar at BEDTIME and administer according to Sliding Scale: If BS is <70 initiate  HYPOglycemia Guidelines, 70-150=0u, 151-200=1u, 201-250=2u, 251-300=3u, 301-350=4u, 351-400=6u, >400=10u and notify provider. Repeat POC blood  sugar check in 2 hours. Continue to repeat 10u and POC blood sugar check every 2 hours until blood glucose is less the 300mg /dL. Once BS is 300mg /dL, repeat blood sugar in 4 hours, then resume normal POC BS check   08/26/2020   insulin regular (NOVOLIN R) 100 units/mL injection Inject 1-10 Units into the skin See admin instructions. Per sliding scale 3 times daily with meals 70-150=0 units 151-200=1 units 201-250=2 units  251-300=3 units 301-350=4 units 351-400=6 units Greater than 400=10 units   08/26/2020   levothyroxine (SYNTHROID) 88 MCG tablet Take 88 mcg by mouth daily.   08/26/2020   lisinopril (ZESTRIL) 10 MG tablet Take 1 tablet (10 mg total) by mouth at bedtime. 30 tablet 0 08/26/2020   metFORMIN (GLUCOPHAGE) 850 MG tablet Take 850 mg by mouth 2 (two) times daily with a meal.   08/26/2020   simvastatin (ZOCOR)  40 MG tablet Take 40 mg by mouth at bedtime.   08/26/2020   timolol (BETIMOL) 0.5 % ophthalmic solution Place 1 drop into the right eye 2 (two) times daily.   08/26/2020   tobramycin-dexamethasone (TOBRADEX) ophthalmic solution Place 4 drops into both ears 2 (two) times daily. 5 mL 0 08/26/2020   vitamin B-12 (CYANOCOBALAMIN) 1000 MCG tablet Take 1,000 mcg by mouth daily.   08/26/2020   Scheduled:   amLODipine  5 mg Oral Daily   Chlorhexidine Gluconate Cloth  6 each Topical Daily   cloNIDine  0.1 mg Transdermal Weekly   insulin aspart  0-5 Units Subcutaneous QHS   insulin aspart  0-6 Units Subcutaneous TID WC   levothyroxine  88 mcg Oral Daily   simvastatin  40 mg Oral QHS   timolol  1 drop Right Eye BID   tobramycin-dexamethasone  4 drop Both EARS BID   Infusions:   sodium chloride 75 mL/hr at 08/28/20 1306   [START ON 08/29/2020] cefTAZidime (FORTAZ)  IV     heparin Stopped (08/28/20 1454)    Assessment: 87 yom with history of essential  hypertension, hypothyroidism, 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 Patient initiated on heparin during that admission and transitioned to apixaban prior to discharge.  Last documented heparin rate 1250 units/hr with therapeutic levels.  Last dose of apixaban prior to arrival 08/26/20 at 1900. Will require aPTT and anti-Xa monitoring until levels are correlated. Baseline aPTT 32.  aPTT has increased to 137 sec on heparin 1300 units/hr. Supra-therapeutic PTT.   RN confirmes heparin drip is infusing at peripheral site and IV RN drew aPTT level from the centreal line PICC site.  Thus PTT 137sec should be accurate.  No bleeding noted per RN.   Goal of Therapy:  Heparin level 0.3-0.7 units/ml aPTT 66-102 seconds Monitor platelets by anticoagulation protocol: Yes   Plan:  Hold heparin x 1 hours then resume at reduced rate of 1100 units/hr Check 6 hour heparin level.  Continue to monitor H&H and platelets Monitor for bleeding   Thank you for allowing pharmacy to be part of this patients care team.  Noah Delaine, RPh Clinical Pharmacist  [phone] 08/28/2020 2:56 PM Please check AMION for all [facility] Pharmacy phone numbers After 10:00 PM, call Main Pharmacy [phone]

## 2020-08-28 NOTE — Progress Notes (Signed)
PROGRESS NOTE    Nicolas James  BPZ:[id number] DOB: [birth year] DOA: 08/27/2020 PCP: Farris Has, MD    Chief Complaint  Patient presents with   Code Stroke    Brief Narrative:  Nicolas James is a 87 y.o. 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 At baseline he is fully functioning and ambulates with a walker. 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 History provided by his daughter. He has been receiving cefepime x 10 weeks with no issues. On Saturday he had his AM dose of cefepime then had an episode of vomiting.  Similar incident at his afternoon dose. He was able to tolerate his PM dose with no episodes of emesis. On Sunday he had another 1 episode of emesis with his AM dose of cefepime and was okay the rest of the day. Drank 80 ounces of water the rest of the day.  On Monday morning he woke up and was getting ready, but did not come down for his AM medication so her husband went to check on him.  He was sitting on the end of his bed and confused. He didn't know where he was, who his son in law was. They immediately called 911.  Subjective:  He was agitated this am, currently appear calm, though he wound allow for full physical exam, he won't keep tele on daughter at bedside   Assessment & Plan:   Principal Problem:   Encephalopathy acute Active Problems:   Osteomyelitis of skull (HCC)   Hypertensive urgency   Insulin dependent type 2 diabetes mellitus (HCC)   Cerebral venous sinus thrombosis   AKI (acute kidney injury) (HCC)   Hyperkalemia   Acute metabolic encephalopathy   Acute metabolic encephalopathy -Seen by neurology , will think encephalopathy most likely metabolic in etiology , neurology recommend MRI and EEG , call neurology back if abnormal findings  -MRI brain no acute findings -EEG  does not appear ordered, will order EEG  AKI, unclear etiology -ANA, ANCA, anti-GBM, C3 and C4 pending             - hold metformin and lisinopril -Avoid contrast, avoid nephrotoxin -On gentle hydration -Nephrology input appreciated, patient declined renal biopsy and dialysis, nephrology recommended palliative care consult for goals of care discussion  Hyperkalemia Hold lisinopril Treated with Lokelma, insulin/dextrose, Lasix, calcium gluconate K normalized  Osteomyelitis of skull base -ID consulted, antibiotic changed to [facility], due to concerning of nephrotoxicity with cefepime  Sigmoid venous thrombosis and IJ thrombosis Hold Eliquis, currently on heparin drip He declined  oral medication currently Continue goals of care discussion  HTN He declined oral meds, on clonidine patch, as needed hydralazine  Insulin-dependent type 2 diabetes -A1c 8.4% -Currently he declined oral intake -Hold metformin, continue SSI, on hypoglycemic protocol  FTT: Patient is currently declining aggressive treatment, does not take oral medication, family report patient did not want to come back to the hospital, family would like to see if there is any improvement in the next 1 to 2 days, family understand may need to proceed with comfort measures and hospice if patient does not improve, palliative care consulted    Body mass index is 25.69 kg/m..  .    Unresulted Labs (From admission, onward)     Start     Ordered   08/29/20 0500  APTT  Daily,  R      08/27/20 2021   08/29/20 0500  Heparin level (unfractionated)  Daily,   R      08/27/20 2021   08/29/20 0500  CBC  Daily,   R      08/28/20 0631   08/28/20 1300  APTT  Once-Timed,   STAT        08/28/20 0631   08/28/20 0500  ANA w/Reflex if Positive  Tomorrow morning,   R        08/27/20 2136   08/28/20 0500  ANCA titers  Tomorrow morning,   R        08/27/20 2136   08/28/20 0500  C3 complement  Tomorrow morning,   R        08/27/20 2136    08/28/20 0500  C4 complement  Tomorrow morning,   R        08/27/20 2136   08/28/20 0500  Glomerular basement membrane antibodies  Tomorrow morning,   R        08/27/20 2136              DVT prophylaxis: on heparin drip   Code Status:DNR Family Communication: daughter at bedside  Disposition:   Status is: Inpatient   Dispo: The patient is from: home              Anticipated d/c is to: home with home hospice ?              Anticipated d/c date is: 1-2 days                Consultants:  Neurology ID Nephrology Palliative care  Procedures:  none  Antimicrobials:    Anti-infectives (From admission, onward)    Start     Dose/Rate Route Frequency Ordered Stop   08/29/20 0900  cefTAZidime (FORTAZ) 0.5 g in dextrose 5 % 50 mL IVPB        0.5 g 100 mL/hr over 30 Minutes Intravenous Every 24 hours 08/28/20 1052     08/29/20 0800  ceFEPIme (MAXIPIME) 1 g in sodium chloride 0.9 % 100 mL IVPB  Status:  Discontinued        1 g 200 mL/hr over 30 Minutes Intravenous Every 24 hours 08/28/20 1048 08/28/20 1052   08/28/20 0800  ceFEPIme (MAXIPIME) 2 g in sodium chloride 0.9 % 100 mL IVPB  Status:  Discontinued        2 g 200 mL/hr over 30 Minutes Intravenous Every 24 hours 08/27/20 2037 08/28/20 1048   08/27/20 1745  ceFEPIme (MAXIPIME) 2 g in sodium chloride 0.9 % 100 mL IVPB  Status:  Discontinued        2 g 200 mL/hr over 30 Minutes Intravenous Every 8 hours 08/27/20 1736 08/27/20 1911          Objective: Vitals:   08/28/20 0645 08/28/20 0651 08/28/20 1100 08/28/20 1155  BP:  (!) 174/95 (!) 181/63   Pulse: 80 84 68   Resp:  16 15   Temp:  98.1 F (36.7 C) 97.6 F (36.4 C)   TempSrc:   Oral   SpO2: 100% 100% 99%   Weight:    78.9 kg  Height:    5\' 9"  (1.753 m)    Intake/Output Summary (Last 24 hours) at 08/28/2020 1339 Last data filed at 08/28/2020 0117 Gross per 24 hour  Intake 4545.45 ml  Output --  Net 4545.45 ml   Filed Weights   08/27/20 2011  08/28/20 1155  Weight: 78.9 kg 78.9 kg    Examination:  General exam: lethargic, won't allow full physical exam, no over agitation currently, picc line to right arm Respiratory system: Clear to auscultation. Respiratory effort normal. Cardiovascular system: wont allow cardiac exam, No pedal edema. Gastrointestinal system: Abdomen is nondistended, soft and nontender.  Normal bowel sounds heard. Central nervous system: lethargic Extremities: no edema Skin: No rashes, lesions or ulcers Psychiatry: lethargic    Data Reviewed: I have personally reviewed following labs and imaging studies  CBC: Recent Labs  Lab 08/27/20 1018 08/27/20 1833 08/28/20 0434  WBC 6.7  --  9.7  NEUTROABS 5.0  --   --   HGB 11.9* 12.9* 11.6*  HCT 34.6* 38.0* 34.3*  MCV 85.6  --  86.6  PLT 218  --  236    Basic Metabolic Panel: Recent Labs  Lab 08/27/20 1018 08/27/20 1827 08/27/20 1833 08/28/20 0030 08/28/20 0434  NA 132* 133* 135 133* 132*  K 7.3* 3.7 3.8 3.7 3.5  CL 98 99 101 100 97*  CO2 19* 21*  --  16* 17*  GLUCOSE 144* 82 79 101* 95  93  BUN 80* 81* 76* 83* 81*  CREATININE 4.76* 5.01* 5.30* 5.26* 5.62*  CALCIUM 9.0 9.2  --  9.1 8.9  PHOS  --   --   --  3.1  --     GFR: Estimated Creatinine Clearance: 9.6 mL/min (A) (by C-G formula based on SCr of 5.62 mg/dL (H)).  Liver Function Tests: Recent Labs  Lab 08/27/20 1018 08/28/20 0030 08/28/20 0434  AST 51*  --  25  ALT 21  --  15  ALKPHOS 53  --  56  BILITOT 1.8*  --  0.6  PROT 5.9*  --  5.7*  ALBUMIN 2.4* 2.3* 2.2*    CBG: Recent Labs  Lab 08/27/20 1023 08/27/20 1326 08/27/20 1512 08/28/20 0947  GLUCAP 147* 142* 104* 117*     Recent Results (from the past 240 hour(s))  Resp Panel by RT-PCR (Flu A&B, Covid) Nasopharyngeal Swab     Status: None   Collection Time: 08/27/20 10:18 AM   Specimen: Nasopharyngeal Swab; Nasopharyngeal(NP) swabs in vial transport medium  Result Value Ref Range Status   SARS Coronavirus  2 by RT PCR NEGATIVE NEGATIVE Final    Comment: (NOTE) SARS-CoV-2 target nucleic acids are NOT DETECTED.  The SARS-CoV-2 RNA is generally detectable in upper respiratory specimens during the acute phase of infection. The lowest concentration of SARS-CoV-2 viral copies this assay can detect is 138 copies/mL. A negative result does not preclude SARS-Cov-2 infection and should not be used as the sole basis for treatment or other patient management decisions. A negative result may occur with  improper specimen collection/handling, submission of specimen other than nasopharyngeal swab, presence of viral mutation(s) within the areas targeted by this assay, and inadequate number of viral copies(<138 copies/mL). A negative result must be combined with clinical observations, patient history, and epidemiological information. The expected result is Negative.  Fact Sheet for Patients:  BloggerCourse.com  Fact Sheet for Healthcare Providers:  SeriousBroker.it  This test is no t yet approved or cleared by the [place] FDA and  has been authorized for detection and/or diagnosis of SARS-CoV-2 by FDA under an Emergency Use Authorization (EUA). This EUA will remain  in effect (meaning this test can be used) for the duration of the COVID-19 declaration under Section 564(b)(1) of the Act, 21 U.S.C.section 360bbb-3(b)(1), unless the authorization is  terminated  or revoked sooner.       Influenza A by PCR NEGATIVE NEGATIVE Final   Influenza B by PCR NEGATIVE NEGATIVE Final    Comment: (NOTE) The Xpert Xpress SARS-CoV-2/FLU/RSV plus assay is intended as an aid in the diagnosis of influenza from Nasopharyngeal swab specimens and should not be used as a sole basis for treatment. Nasal washings and aspirates are unacceptable for Xpert Xpress SARS-CoV-2/FLU/RSV testing.  Fact Sheet for Patients: EntrepreneurPulse.com.au  Fact  Sheet for Healthcare Providers: IncredibleEmployment.be  This test is not yet approved or cleared by the [place] FDA and has been authorized for detection and/or diagnosis of SARS-CoV-2 by FDA under an Emergency Use Authorization (EUA). This EUA will remain in effect (meaning this test can be used) for the duration of the COVID-19 declaration under Section 564(b)(1) of the Act, 21 U.S.C. section 360bbb-3(b)(1), unless the authorization is terminated or revoked.  Performed at [facility], [place] [address] [place], [place] [address]   Blood culture (routine x 2)     Status: None (Preliminary result)   Collection Time: 08/27/20 12:08 PM   Specimen: BLOOD RIGHT ARM  Result Value Ref Range Status   Specimen Description BLOOD RIGHT ARM  Final   Special Requests   Final    BOTTLES DRAWN AEROBIC AND ANAEROBIC Blood Culture adequate volume   Culture   Final    NO GROWTH < 24 HOURS Performed at [facility], [place] [address], [place], [place] [address]    Report Status PENDING  Incomplete  Urine Culture     Status: Abnormal   Collection Time: 08/27/20  6:29 PM   Specimen: Urine, Clean Catch  Result Value Ref Range Status   Specimen Description URINE, CLEAN CATCH  Final   Special Requests NONE  Final   Culture (A)  Final    <10,000 COLONIES/mL INSIGNIFICANT GROWTH Performed at [facility], [place] [address], [place], [place] [address]    Report Status 08/28/2020 FINAL  Final  Blood culture (routine x 2)     Status: None (Preliminary result)   Collection Time: 08/27/20  7:11 PM   Specimen: BLOOD  Result Value Ref Range Status   Specimen Description BLOOD RIGHT ANTECUBITAL  Final   Special Requests   Final    BOTTLES DRAWN AEROBIC AND ANAEROBIC Blood Culture adequate volume   Culture   Final    NO GROWTH < 12 HOURS Performed at [facility], [place] [address], [place], [place] [address]    Report Status PENDING  Incomplete          Radiology Studies: MR BRAIN WO CONTRAST  Result Date: 08/27/2020 CLINICAL DATA:  Neuro deficit.  Stroke suspected. EXAM: MRI HEAD WITHOUT CONTRAST TECHNIQUE: Multiplanar, multiecho pulse sequences of the brain and surrounding structures were obtained without intravenous contrast. COMPARISON:  CT head without contrast 08/27/2020. CT a head and neck and CT perfusion 08/27/2020. MR head without contrast 08/01/2020 FINDINGS: Brain: No acute infarct, hemorrhage, or mass lesion is present. The ventricles are of normal size. Mild atrophy and minimal white matter changes are within normal limits for age. The ventricles are of normal size. No significant extraaxial fluid collection is present. The internal auditory canals are within normal limits. 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 Cerebellum otherwise within normal limits. Vascular: Flow is present in the major intracranial arteries. Skull and upper cervical spine: Nasopharyngeal soft tissue prominence slightly improved from prior study.  Heterogeneous signal changes again noted in the clivus and dens. Craniocervical junction otherwise within normal limits. Midline structures otherwise unremarkable. Sinuses/Orbits: Bilateral mastoid effusions are again noted. No obstructing nasopharyngeal lesion is evident. Mild mucosal thickening is present in the floor of the maxillary sinuses bilaterally. The paranasal sinuses and mastoid air cells are otherwise clear. Bilateral lens replacements are noted. Globes and orbits are otherwise unremarkable. IMPRESSION: 1. No acute intracranial abnormality. 2. Normal MRI appearance of the brain for age. 3. Bilateral mastoid effusions. 4. Nasopharyngeal soft tissue prominence slightly improved from prior study. Findings consistent with improving known skull base infection. Electronically Signed   By: Marin Roberts M.D.   On: 08/27/2020 16:51   US RENAL  Result Date: 08/27/2020 CLINICAL DATA:   Acute kidney injury EXAM: RENAL / URINARY TRACT ULTRASOUND COMPLETE COMPARISON:  CT 08/27/2020 FINDINGS: Right Kidney: Renal measurements: 10.9 x 6.8 x 6.3 cm = volume: 245.8 mL. Cortex is echogenic. No hydronephrosis. Trace perinephric fluid. Multiple cysts. Septated cyst in the mid right kidney measuring 3.1 x 3.3 x 2.4 cm. Cyst in the midpole measuring 2.5 x 1.9 x 1.7 cm. Left Kidney: Renal measurements: 12.4 x 5.9 x 6 cm = volume: 227.3 mL. Cortex is echogenic. No hydronephrosis. Multiple cysts, greater than 10. Septated cyst in the midpole measuring 2.5 x 2.2 x 1.9 cm. Cyst at the upper pole measuring 1.4 x 1.3 x 1.1 cm. Trace perinephric fluid. Bladder: Appears normal for degree of bladder distention. Other: Enlarged prostate. IMPRESSION: 1. Echogenic kidneys consistent with medical renal disease. No hydronephrosis. 2. Bilateral renal cysts 3. Enlarged prostate Electronically Signed   By: Jasmine Pang M.D.   On: 08/27/2020 19:56   CT CEREBRAL PERFUSION W CONTRAST  Result Date: 08/27/2020 CLINICAL DATA:  Neuro deficit, acute, stroke suspected EXAM: CT ANGIOGRAPHY HEAD AND NECK CT VENOGRAM HEAD CT PERFUSION BRAIN TECHNIQUE: Multidetector CT imaging of the head and neck was performed using the standard protocol during bolus administration of intravenous contrast. Multiplanar CT image reconstructions and MIPs were obtained to evaluate the vascular anatomy. Carotid stenosis measurements (when applicable) are obtained utilizing NASCET criteria, using the distal internal carotid diameter as the denominator. Delayed multidetector CT imaging of the head was performed following the administration of intravenous contrast. Multiplanar CT image reconstructions and MIPS were obtained to evaluate the venous anatomy. Multiphase CT imaging of the brain was performed following IV bolus contrast injection. Subsequent parametric perfusion maps were calculated using RAPID software. CONTRAST:  OMNIPAQUE IOHEXOL 350 MG/ML  SOLN COMPARISON:  Prior CT and MR imaging FINDINGS: CTV HEAD Superior sagittal sinus, straight sinus, vein of Galen, and internal cerebral veins are patent. Bilateral transverse and sigmoid sinuses are patent. There is suspected persistent nonocclusive thrombus within the proximal internal jugular veins. CTA NECK Aortic arch: Mild mixed plaque along the arch. Great vessel origins are patent. Right carotid system: Patent. Mild calcified plaque along the common carotid. Mixed plaque at the ICA origin without stenosis. Left carotid system: Patent. Focal noncalcified plaque along the common carotid causing less than 50% stenosis. Calcified plaque along proximal left ICA without stenosis. Vertebral arteries: Patent. Left vertebral artery is slightly dominant. No stenosis. Skeleton: Cervical spine degenerative changes. Skull base irregularity likely related to prior osteomyelitis. Other neck: No evidence of progressive infection. Upper chest: Included upper lungs are clear. Review of the MIP images confirms the above findings CTA HEAD Anterior circulation: Intracranial internal carotid arteries are patent with calcified plaque but no significant stenosis. Anterior and middle cerebral  arteries are patent. Posterior circulation: Intracranial vertebral arteries are patent. There is calcified plaque along the left vertebral artery with mild stenosis. Basilar artery is patent. Major cerebellar artery origins are patent. Posterior cerebral arteries are patent. Review of the MIP images confirms the above findings CT Brain Perfusion Findings: CBF (<30%) Volume: 3mL Perfusion (Tmax>6.0s) volume: 66mL Mismatch Volume: 25mL Infarction Location: None. IMPRESSION: No large vessel arterial occlusion or hemodynamically significant stenosis. Perfusion imaging demonstrates no evidence of core infarction or penumbra. Suspect persistent nonocclusive thrombus within the proximal internal jugular veins bilaterally. Electronically Signed   By:  Macy Mis M.D.   On: 08/27/2020 11:24   DG Chest Portable 1 View  Result Date: 08/27/2020 CLINICAL DATA:  Confused, found last night holding head, LEFT facial drooping and confusion today EXAM: PORTABLE CHEST 1 VIEW COMPARISON:  Portable exam 1207 hours compared to 06/19/2020 FINDINGS: Borderline enlargement of cardiac silhouette. Mediastinal contours and pulmonary vascularity normal. Atherosclerotic calcification aorta. Tip of RIGHT arm PICC line projects over SVC. Lungs clear. No infiltrate, pleural effusion, or pneumothorax. Bones demineralized. IMPRESSION: No acute abnormalities. Aortic Atherosclerosis (ICD10-I70.0). Electronically Signed   By: Lavonia Dana M.D.   On: 08/27/2020 12:39   CT Renal Stone Study  Result Date: 08/27/2020 CLINICAL DATA:  Acute renal failure EXAM: CT ABDOMEN AND PELVIS WITHOUT CONTRAST TECHNIQUE: Multidetector CT imaging of the abdomen and pelvis was performed following the standard protocol without IV contrast. COMPARISON:  None. FINDINGS: Lower chest: Trace bilateral pleural effusions with associated bibasilar opacities, likely atelectasis. Heart size is upper limits of normal. Trace pericardial effusion. Atherosclerotic calcifications of the coronary arteries. Calcified right hilar lymph nodes and right basilar granuloma. Hepatobiliary: Unremarkable unenhanced appearance of the liver. No focal liver lesion identified. Gallbladder within normal limits. No hyperdense gallstone. No biliary dilatation. Pancreas: Unremarkable. No pancreatic ductal dilatation or surrounding inflammatory changes. Spleen: Normal in size without focal abnormality. Adrenals/Urinary Tract: Unremarkable adrenal glands. There are multiple bilateral renal cysts, the largest within the upper pole of the right kidney measuring up to 4.0 cm. Additional bilateral subcentimeter renal lesions are too small to definitively characterize, but most likely also represent cysts. No renal stone or hydronephrosis.  Nonspecific bilateral perinephric stranding. There is excreted contrast within the urinary bladder. The bladder base is obscured by artifact from patient's hip prostheses. Stomach/Bowel: Stomach is within normal limits. Appendix not definitively visualized. No evidence of bowel wall thickening, distention, or inflammatory changes. Vascular/Lymphatic: Extensive atherosclerotic calcification throughout the aortoiliac axis. No aneurysm. No abdominopelvic lymphadenopathy. Reproductive: Obscured by artifact from hip prostheses. Other: No organized abdominopelvic fluid collection. No pneumoperitoneum. No abdominal wall hernia. Musculoskeletal: Partially visualized bilateral hip prostheses without apparent complication. Lumbosacral fusion hardware appears intact. No acute osseous findings. Mild anasarca. IMPRESSION: 1. No acute abdominopelvic findings. Specifically, no evidence of obstructive uropathy. 2.  Nonspecific bilateral perinephric stranding. 3. Trace bilateral pleural effusions with associated bibasilar opacities, likely atelectasis. 4. Trace pericardial effusion. 5. Bilateral renal cysts. Additional bilateral subcentimeter renal lesions are too small to definitively characterize, but most likely also represent cysts. Aortic Atherosclerosis (ICD10-I70.0). Electronically Signed   By: Davina Poke D.O.   On: 08/27/2020 16:19   CT VENOGRAM HEAD  Result Date: 08/27/2020 CLINICAL DATA:  Neuro deficit, acute, stroke suspected EXAM: CT ANGIOGRAPHY HEAD AND NECK CT VENOGRAM HEAD CT PERFUSION BRAIN TECHNIQUE: Multidetector CT imaging of the head and neck was performed using the standard protocol during bolus administration of intravenous contrast. Multiplanar CT image reconstructions and MIPs were obtained to  evaluate the vascular anatomy. Carotid stenosis measurements (when applicable) are obtained utilizing NASCET criteria, using the distal internal carotid diameter as the denominator. Delayed multidetector CT  imaging of the head was performed following the administration of intravenous contrast. Multiplanar CT image reconstructions and MIPS were obtained to evaluate the venous anatomy. Multiphase CT imaging of the brain was performed following IV bolus contrast injection. Subsequent parametric perfusion maps were calculated using RAPID software. CONTRAST:  OMNIPAQUE IOHEXOL 350 MG/ML SOLN COMPARISON:  Prior CT and MR imaging FINDINGS: CTV HEAD Superior sagittal sinus, straight sinus, vein of Galen, and internal cerebral veins are patent. Bilateral transverse and sigmoid sinuses are patent. There is suspected persistent nonocclusive thrombus within the proximal internal jugular veins. CTA NECK Aortic arch: Mild mixed plaque along the arch. Great vessel origins are patent. Right carotid system: Patent. Mild calcified plaque along the common carotid. Mixed plaque at the ICA origin without stenosis. Left carotid system: Patent. Focal noncalcified plaque along the common carotid causing less than 50% stenosis. Calcified plaque along proximal left ICA without stenosis. Vertebral arteries: Patent. Left vertebral artery is slightly dominant. No stenosis. Skeleton: Cervical spine degenerative changes. Skull base irregularity likely related to prior osteomyelitis. Other neck: No evidence of progressive infection. Upper chest: Included upper lungs are clear. Review of the MIP images confirms the above findings CTA HEAD Anterior circulation: Intracranial internal carotid arteries are patent with calcified plaque but no significant stenosis. Anterior and middle cerebral arteries are patent. Posterior circulation: Intracranial vertebral arteries are patent. There is calcified plaque along the left vertebral artery with mild stenosis. Basilar artery is patent. Major cerebellar artery origins are patent. Posterior cerebral arteries are patent. Review of the MIP images confirms the above findings CT Brain Perfusion Findings: CBF  (<30%) Volume: 8mL Perfusion (Tmax>6.0s) volume: 68mL Mismatch Volume: 74mL Infarction Location: None. IMPRESSION: No large vessel arterial occlusion or hemodynamically significant stenosis. Perfusion imaging demonstrates no evidence of core infarction or penumbra. Suspect persistent nonocclusive thrombus within the proximal internal jugular veins bilaterally. Electronically Signed   By: Guadlupe Spanish M.D.   On: 08/27/2020 11:24   CT HEAD CODE STROKE WO CONTRAST  Result Date: 08/27/2020 CLINICAL DATA:  Code stroke. Neuro deficit, acute, stroke suspected. Diabetes and hypertension. EXAM: CT HEAD WITHOUT CONTRAST TECHNIQUE: Contiguous axial images were obtained from the base of the skull through the vertex without intravenous contrast. COMPARISON:  MRI 08/01/2020 FINDINGS: Brain: Age related atrophy. No sign of acute infarction, mass lesion, hemorrhage, hydrocephalus or extra-axial collection. Vascular: There is atherosclerotic calcification of the major vessels at the base of the brain. Skull: No evidence gross destructive change of the skull base. See prior imaging. Sinuses/Orbits: Mucosal thickening of the maxillary sinuses. Persistent bilateral mastoid effusions, improved since the previous imaging. There is no longer any fluid in the middle ears. Other: None ASPECTS (Alberta Stroke Program Early CT Score) - Ganglionic level infarction (caudate, lentiform nuclei, internal capsule, insula, M1-M3 cortex): 7 - Supraganglionic infarction (M4-M6 cortex): 3 Total score (0-10 with 10 being normal): 10 IMPRESSION: 1. No acute or focal brain finding. 2. ASPECTS is 10 3. Less fluid in the mastoid air cells. Resolution of fluid in the middle ears. 4. These results were communicated to Dr. Wilford Corner at 10:37 am on 08/27/2020 by text page via the [facility] messaging system. Electronically Signed   By: Paulina Fusi M.D.   On: 08/27/2020 10:39   CT ANGIO HEAD NECK W WO CM (CODE STROKE)  Result Date: 08/27/2020 CLINICAL DATA:  Neuro  deficit, acute, stroke suspected EXAM: CT ANGIOGRAPHY HEAD AND NECK CT VENOGRAM HEAD CT PERFUSION BRAIN TECHNIQUE: Multidetector CT imaging of the head and neck was performed using the standard protocol during bolus administration of intravenous contrast. Multiplanar CT image reconstructions and MIPs were obtained to evaluate the vascular anatomy. Carotid stenosis measurements (when applicable) are obtained utilizing NASCET criteria, using the distal internal carotid diameter as the denominator. Delayed multidetector CT imaging of the head was performed following the administration of intravenous contrast. Multiplanar CT image reconstructions and MIPS were obtained to evaluate the venous anatomy. Multiphase CT imaging of the brain was performed following IV bolus contrast injection. Subsequent parametric perfusion maps were calculated using RAPID software. CONTRAST:  OMNIPAQUE IOHEXOL 350 MG/ML SOLN COMPARISON:  Prior CT and MR imaging FINDINGS: CTV HEAD Superior sagittal sinus, straight sinus, vein of Galen, and internal cerebral veins are patent. Bilateral transverse and sigmoid sinuses are patent. There is suspected persistent nonocclusive thrombus within the proximal internal jugular veins. CTA NECK Aortic arch: Mild mixed plaque along the arch. Great vessel origins are patent. Right carotid system: Patent. Mild calcified plaque along the common carotid. Mixed plaque at the ICA origin without stenosis. Left carotid system: Patent. Focal noncalcified plaque along the common carotid causing less than 50% stenosis. Calcified plaque along proximal left ICA without stenosis. Vertebral arteries: Patent. Left vertebral artery is slightly dominant. No stenosis. Skeleton: Cervical spine degenerative changes. Skull base irregularity likely related to prior osteomyelitis. Other neck: No evidence of progressive infection. Upper chest: Included upper lungs are clear. Review of the MIP images confirms the above findings  CTA HEAD Anterior circulation: Intracranial internal carotid arteries are patent with calcified plaque but no significant stenosis. Anterior and middle cerebral arteries are patent. Posterior circulation: Intracranial vertebral arteries are patent. There is calcified plaque along the left vertebral artery with mild stenosis. Basilar artery is patent. Major cerebellar artery origins are patent. Posterior cerebral arteries are patent. Review of the MIP images confirms the above findings CT Brain Perfusion Findings: CBF (<30%) Volume: 0mL Perfusion (Tmax>6.0s) volume: 0mL Mismatch Volume: 0mL Infarction Location: None. IMPRESSION: No large vessel arterial occlusion or hemodynamically significant stenosis. Perfusion imaging demonstrates no evidence of core infarction or penumbra. Suspect persistent nonocclusive thrombus within the proximal internal jugular veins bilaterally. Electronically Signed   By: Guadlupe Spanish M.D.   On: 08/27/2020 11:24        Scheduled Meds:  amLODipine  5 mg Oral Daily   Chlorhexidine Gluconate Cloth  6 each Topical Daily   cloNIDine  0.1 mg Transdermal Weekly   insulin aspart  0-5 Units Subcutaneous QHS   insulin aspart  0-6 Units Subcutaneous TID WC   levothyroxine  88 mcg Oral Daily   simvastatin  40 mg Oral QHS   timolol  1 drop Right Eye BID   tobramycin-dexamethasone  4 drop Both EARS BID   Continuous Infusions:  sodium chloride 75 mL/hr at 08/28/20 1306   [START ON 08/29/2020] cefTAZidime (FORTAZ)  IV     heparin 1,300 Units/hr (08/28/20 0609)     LOS: 0 days   Time spent: Greater than 50% of this time was spent in counseling, explanation of diagnosis, planning of further management, and coordination of care.   Voice Recognition [employer] [place] dictation system was used to create this note, attempts have been made to correct errors. Please contact the author with questions and/or clarifications.   Albertine Grates, MD PhD FACP [facility]  Available via  Epic  secure chat 7am-7pm for nonurgent issues Please page for urgent issues To page the attending provider between 7A-7P or the covering provider during after hours 7P-7A, please log into the web site [email or web address] and access using universal [place] password for that web site. If you do not have the password, please call the hospital operator.    08/28/2020, 1:39 PM

## 2020-08-28 NOTE — Progress Notes (Signed)
Irene Pap, RN will be receiving this patient.

## 2020-08-28 NOTE — Plan of Care (Signed)

## 2020-08-29 DIAGNOSIS — M869 Osteomyelitis, unspecified: Secondary | ICD-10-CM

## 2020-08-29 DIAGNOSIS — G08 Intracranial and intraspinal phlebitis and thrombophlebitis: Secondary | ICD-10-CM

## 2020-08-29 LAB — CBC
HCT: 30.3 % — ABNORMAL LOW (ref 39.0–52.0)
Hemoglobin: 10.3 g/dL — ABNORMAL LOW (ref 13.0–17.0)
MCH: 29.3 pg (ref 26.0–34.0)
MCHC: 34 g/dL (ref 30.0–36.0)
MCV: 86.1 fL (ref 80.0–100.0)
Platelets: 199 10*3/uL (ref 150–400)
RBC: 3.52 MIL/uL — ABNORMAL LOW (ref 4.22–5.81)
RDW: 14.4 % (ref 11.5–15.5)
WBC: 8 10*3/uL (ref 4.0–10.5)
nRBC: 0 % (ref 0.0–0.2)

## 2020-08-29 LAB — APTT
aPTT: 105 seconds — ABNORMAL HIGH (ref 24–36)
aPTT: 71 seconds — ABNORMAL HIGH (ref 24–36)

## 2020-08-29 LAB — BASIC METABOLIC PANEL
Anion gap: 16 — ABNORMAL HIGH (ref 5–15)
BUN: 88 mg/dL — ABNORMAL HIGH (ref 8–23)
CO2: 14 mmol/L — ABNORMAL LOW (ref 22–32)
Calcium: 8 mg/dL — ABNORMAL LOW (ref 8.9–10.3)
Chloride: 105 mmol/L (ref 98–111)
Creatinine, Ser: 7.21 mg/dL — ABNORMAL HIGH (ref 0.61–1.24)
GFR, Estimated: 7 mL/min — ABNORMAL LOW (ref >60)
Glucose, Bld: 82 mg/dL (ref 70–99)
Potassium: 3.6 mmol/L (ref 3.5–5.1)
Sodium: 135 mmol/L (ref 135–145)

## 2020-08-29 LAB — ANA W/REFLEX IF POSITIVE: Anti Nuclear Antibody (ANA): NEGATIVE

## 2020-08-29 LAB — GLUCOSE, CAPILLARY
Glucose-Capillary: 86 mg/dL (ref 70–99)
Glucose-Capillary: 82 mg/dL (ref 70–99)
Glucose-Capillary: 78 mg/dL (ref 70–99)
Glucose-Capillary: 83 mg/dL (ref 70–99)

## 2020-08-29 LAB — C3 COMPLEMENT: C3 Complement: 86 mg/dL (ref 82–167)

## 2020-08-29 LAB — HEPARIN LEVEL (UNFRACTIONATED): Heparin Unfractionated: >1.1 IU/mL — ABNORMAL HIGH (ref 0.30–0.70)

## 2020-08-29 LAB — GLOMERULAR BASEMENT MEMBRANE ANTIBODIES: GBM Ab: <0.2 units (ref 0.0–0.9)

## 2020-08-29 LAB — C4 COMPLEMENT: Complement C4, Body Fluid: 29 mg/dL (ref 12–38)

## 2020-08-29 NOTE — Progress Notes (Signed)
PICC line dressing due was yesterday, but patient was so confused. Patient's daughters wanted to change dressing 8/10. Patient daughters wanted to see ID doctor before change dressing. Because they might change to comfort care. If changed to comfort care then they want to take out PICC line instead change dressing. Patient's daughter requested to change dressing tomorrow after discussing with ID doctor. HS [employer]

## 2020-08-29 NOTE — Progress Notes (Signed)
Redge Gainer [phone] Civil engineer, contracting [facility]) Hospital Liaison RN note  Received request from [facility] for hospice services at home after discharge. Chart and patient information under review by Hospice physician. Hospice eligibility pending at this time.  Spoke with patient's daughters to initiate education related to hospice philosophy, services, and team approach to care. Patient/family verbalized understanding of information given. Per discussion, the plan is for discharge home by EMS likely on Thursday or Friday. DME needs discussed. Patient has the following equipment in the home. Walker  Patient/family requests the following equipment for deliver: Hospital bed, overbed table, wheelchair, BSC, and slide board. Address has been verified and is correct in the chart. [name] at [phone] is the family contact to arrange time of equipment delivery.  Please send signed and completed DNR with patient/family. Please provide symptoms at discharge as needed for ongoing symptom management.  [facility] information and contact numbers given to [name]. Above information shared with TOC's Herbert Seta and Cyrus.   Please call with any hospice related questions or concerns.  Thank you for the opportunity to participate in this patient's care.  Thea Gist, Charity fundraiser, BSN [employer] [phone]

## 2020-08-29 NOTE — Progress Notes (Signed)
ANTICOAGULATION CONSULT NOTE - Follow Up Consult  Pharmacy Consult for heparin Indication:  bilateral venous sinus thrombosis  Labs: Recent Labs    08/27/20 1018 08/27/20 1827 08/27/20 1833 08/28/20 0030 08/28/20 0434 08/28/20 1300 08/28/20 2330  HGB 11.9*  --  12.9*  --  11.6*  --   --   HCT 34.6*  --  38.0*  --  34.3*  --   --   PLT 218  --   --   --  236  --   --   APTT 32  --   --   --  76* 137* 105*  LABPROT 16.5*  --   --   --   --   --   --   INR 1.3*  --   --   --   --   --   --   HEPARINUNFRC  --   --   --   --  >1.10*  --   --   CREATININE 4.76*   < > 5.30* 5.26* 5.62*  --   --   CKTOTAL  --   --   --  37*  --   --   --    < > = values in this interval not displayed.     Assessment: 87yo male remains slightly supratherapeutic on heparin after rate change though approaching goal; no infusion issues or signs of bleeding per RN.  Goal of Therapy:  aPTT 66-102 seconds   Plan:  Will decrease heparin infusion by 1-2 units/kg/hr to 1000 units/hr and check PTT in 8 hours.    Vernard Gambles, PharmD, BCPS 08/29/2020,2:29 AM

## 2020-08-29 NOTE — Progress Notes (Signed)
Patient ID: Nicolas James, male   DOB: [birth year], 87 y.o.   MRN: [id number]  PROGRESS NOTE    Nicolas James  DJM:[id number] DOB: April 05, 1933 DOA: 08/27/2020 PCP: Farris Has, MD   Brief Narrative:  87 y.o. 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  On presentation, he was found to have acute renal failure.  Neurology, nephrology and ID were consulted.  Assessment & Plan:   Acute metabolic encephalopathy -Seen by neurology: Of the opinion that encephalopathy is most likely metabolic in etiology.  MRI of the brain showed no acute findings.  EEG pending. -Still has waxing and waning mental status.  Oral intake very poor.  AKI, unclear etiology Hyperkalemia: Resolved -Nephrology following.  Renal ultrasound was negative for hydronephrosis.  Renal function worsening.  Creatinine 5.62 on 08/28/2020.  Creatinine pending for today.  Treated with gentle hydration per nephrology. -Patient declined renal biopsy and dialysis.  Nephrology recommended palliative care consult for goals of care discussion.  Chronic osteomyelitis of skull base -Was on cefepime for 10 weeks prior to presentation.  ID following and antibiotics have been changed to ceftazidime due to concern for  neurotoxicity. -ID is doubtful that continuing long-term IV antibiotics will help in curing patient's condition. -ID is recommending to continue palliative care discussions regarding goals of care  Sigmoid venous thrombosis and IJ thrombosis -Currently on heparin drip.  Patient refused oral Eliquis.  Hypertension -Declined oral meds.  Continue clonidine patch and as needed hydralazine  Hypothyroidism-continue levothyroxine if takes orally  Diabetes mellitus type 2 next-A1c 8.4.  Continue CBGs  with SSI  Failure to thrive Goals of care -Patient is currently declining aggressive treatment, does not take oral medication, family reported that patient did not want to come back to the hospital -Overall prognosis is very poor.  Oral intake is very poor. -Consultants are recommending palliative care for goals of care discussion. -I think patient would be appropriate for hospice/comfort measures.    DVT prophylaxis: Heparin drip Code Status: DNR Family Communication: Daughter at bedside Disposition Plan: Status is: Inpatient  Remains inpatient appropriate because:Inpatient level of care appropriate due to severity of illness  Dispo: The patient is from: Home              Anticipated d/c is to: Home with possible hospice if family agrees              Patient currently is not medically stable to d/c.   Difficult to place patient No  Consultants: Neurology/nephrology/ID/palliative care  Procedures: None  Antimicrobials:  Anti-infectives (From admission, onward)    Start     Dose/Rate Route Frequency Ordered Stop   08/29/20 0900  cefTAZidime (FORTAZ) 0.5 g in dextrose 5 % 50 mL IVPB        0.5 g 100 mL/hr over 30 Minutes Intravenous Every 24 hours 08/28/20 1052     08/29/20 0800  ceFEPIme (MAXIPIME) 1 g in sodium chloride 0.9 % 100 mL IVPB  Status:  Discontinued        1 g 200 mL/hr over 30 Minutes Intravenous Every 24 hours 08/28/20 1048 08/28/20 1052   08/28/20 0800  ceFEPIme (MAXIPIME) 2 g in sodium chloride 0.9 % 100 mL IVPB  Status:  Discontinued        2 g 200  mL/hr over 30 Minutes Intravenous Every 24 hours 08/27/20 2037 08/28/20 1048   08/27/20 1745  ceFEPIme (MAXIPIME) 2 g in sodium chloride 0.9 % 100 mL IVPB  Status:  Discontinued        2 g 200 mL/hr over 30 Minutes Intravenous Every 8 hours 08/27/20 1736 08/27/20 1911        Subjective: Patient seen and examined at bedside.  Daughter present at bedside.  Patient wakes up slightly, hardly answers any  questions.  Oral intake very poor and refusing oral meds as per nursing staff.  No overnight fever or vomiting reported.  Objective: Vitals:   08/28/20 1155 08/28/20 1502 08/29/20 0542 08/29/20 1100  BP:  (!) 171/75 (!) 155/67 (!) 159/134  Pulse:  70 63 62  Resp:  17 18 17   Temp:  97.6 F (36.4 C)  97.8 F (36.6 C)  TempSrc:  Oral  Oral  SpO2:      Weight: 78.9 kg     Height: 5\' 9"  (1.753 m)       Intake/Output Summary (Last 24 hours) at 08/29/2020 1329 Last data filed at 08/29/2020 1110 Gross per 24 hour  Intake 1818.25 ml  Output --  Net 1818.25 ml   Filed Weights   08/27/20 2011 08/28/20 1155  Weight: 78.9 kg 78.9 kg    Examination:  General exam: Elderly male lying in bed.  No distress.  Looks chronically ill.   Respiratory system: Bilateral decreased breath sounds at bases with scattered crackles Cardiovascular system: S1 & S2 heard, Rate controlled Gastrointestinal system: Abdomen is nondistended, soft and nontender. Normal bowel sounds heard. Extremities: No cyanosis, clubbing; trace lower extremity present Central nervous system: Wakes up slightly, hardly answers any questions.  Very slow to respond.  No focal neurological deficits. Moving extremities Skin: No rashes, lesions or ulcers Psychiatry: Could not be assessed because of mental status   Data Reviewed: I have personally reviewed following labs and imaging studies  CBC: Recent Labs  Lab 08/27/20 1018 08/27/20 1833 08/28/20 0434 08/29/20 0233  WBC 6.7  --  9.7 8.0  NEUTROABS 5.0  --   --   --   HGB 11.9* 12.9* 11.6* 10.3*  HCT 34.6* 38.0* 34.3* 30.3*  MCV 85.6  --  86.6 86.1  PLT 218  --  236 199   Basic Metabolic Panel: Recent Labs  Lab 08/27/20 1018 08/27/20 1827 08/27/20 1833 08/28/20 0030 08/28/20 0434  NA 132* 133* 135 133* 132*  K 7.3* 3.7 3.8 3.7 3.5  CL 98 99 101 100 97*  CO2 19* 21*  --  16* 17*  GLUCOSE 144* 82 79 101* 95  93  BUN 80* 81* 76* 83* 81*  CREATININE 4.76* 5.01*  5.30* 5.26* 5.62*  CALCIUM 9.0 9.2  --  9.1 8.9  PHOS  --   --   --  3.1  --    GFR: Estimated Creatinine Clearance: 9.6 mL/min (A) (by C-G formula based on SCr of 5.62 mg/dL (H)). Liver Function Tests: Recent Labs  Lab 08/27/20 1018 08/28/20 0030 08/28/20 0434  AST 51*  --  25  ALT 21  --  15  ALKPHOS 53  --  56  BILITOT 1.8*  --  0.6  PROT 5.9*  --  5.7*  ALBUMIN 2.4* 2.3* 2.2*   No results for input(s): LIPASE, AMYLASE in the last 168 hours. No results for input(s): AMMONIA in the last 168 hours. Coagulation Profile: Recent Labs  Lab 08/27/20 1018  INR  1.3*   Cardiac Enzymes: Recent Labs  Lab 08/28/20 0030  CKTOTAL 37*   BNP (last 3 results) No results for input(s): PROBNP in the last 8760 hours. HbA1C: Recent Labs    08/28/20 0434  HGBA1C 8.4*   CBG: Recent Labs  Lab 08/28/20 0947 08/28/20 1851 08/28/20 2043 08/29/20 0704 08/29/20 1215  GLUCAP 117* 124* 114* 86 82   Lipid Profile: No results for input(s): CHOL, HDL, LDLCALC, TRIG, CHOLHDL, LDLDIRECT in the last 72 hours. Thyroid Function Tests: Recent Labs    08/28/20 0026  TSH 5.896*   Anemia Panel: Recent Labs    08/28/20 0026  VITAMINB12 748   Sepsis Labs: No results for input(s): PROCALCITON, LATICACIDVEN in the last 168 hours.  Recent Results (from the past 240 hour(s))  Resp Panel by RT-PCR (Flu A&B, Covid) Nasopharyngeal Swab     Status: None   Collection Time: 08/27/20 10:18 AM   Specimen: Nasopharyngeal Swab; Nasopharyngeal(NP) swabs in vial transport medium  Result Value Ref Range Status   SARS Coronavirus 2 by RT PCR NEGATIVE NEGATIVE Final    Comment: (NOTE) SARS-CoV-2 target nucleic acids are NOT DETECTED.  The SARS-CoV-2 RNA is generally detectable in upper respiratory specimens during the acute phase of infection. The lowest concentration of SARS-CoV-2 viral copies this assay can detect is 138 copies/mL. A negative result does not preclude SARS-Cov-2 infection and  should not be used as the sole basis for treatment or other patient management decisions. A negative result may occur with  improper specimen collection/handling, submission of specimen other than nasopharyngeal swab, presence of viral mutation(s) within the areas targeted by this assay, and inadequate number of viral copies(<138 copies/mL). A negative result must be combined with clinical observations, patient history, and epidemiological information. The expected result is Negative.  Fact Sheet for Patients:  BloggerCourse.com  Fact Sheet for Healthcare Providers:  SeriousBroker.it  This test is no t yet approved or cleared by the [place] FDA and  has been authorized for detection and/or diagnosis of SARS-CoV-2 by FDA under an Emergency Use Authorization (EUA). This EUA will remain  in effect (meaning this test can be used) for the duration of the COVID-19 declaration under Section 564(b)(1) of the Act, 21 U.S.C.section 360bbb-3(b)(1), unless the authorization is terminated  or revoked sooner.       Influenza A by PCR NEGATIVE NEGATIVE Final   Influenza B by PCR NEGATIVE NEGATIVE Final    Comment: (NOTE) The Xpert Xpress SARS-CoV-2/FLU/RSV plus assay is intended as an aid in the diagnosis of influenza from Nasopharyngeal swab specimens and should not be used as a sole basis for treatment. Nasal washings and aspirates are unacceptable for Xpert Xpress SARS-CoV-2/FLU/RSV testing.  Fact Sheet for Patients: BloggerCourse.com  Fact Sheet for Healthcare Providers: SeriousBroker.it  This test is not yet approved or cleared by the [place] FDA and has been authorized for detection and/or diagnosis of SARS-CoV-2 by FDA under an Emergency Use Authorization (EUA). This EUA will remain in effect (meaning this test can be used) for the duration of the COVID-19 declaration  under Section 564(b)(1) of the Act, 21 U.S.C. section 360bbb-3(b)(1), unless the authorization is terminated or revoked.  Performed at [facility], [address] [place], [place] [address]   Blood culture (routine x 2)     Status: None (Preliminary result)   Collection Time: 08/27/20 12:08 PM   Specimen: BLOOD RIGHT ARM  Result Value Ref Range Status   Specimen Description BLOOD RIGHT ARM  Final   Special Requests   Final    BOTTLES DRAWN AEROBIC AND ANAEROBIC Blood Culture adequate volume   Culture   Final    NO GROWTH 2 DAYS Performed at [facility], [address] [place], [place] [address]    Report Status PENDING  Incomplete  Urine Culture     Status: Abnormal   Collection Time: 08/27/20  6:29 PM   Specimen: Urine, Clean Catch  Result Value Ref Range Status   Specimen Description URINE, CLEAN CATCH  Final   Special Requests NONE  Final   Culture (A)  Final    <10,000 COLONIES/mL INSIGNIFICANT GROWTH Performed at [facility], [address] [place], [place] [address]    Report Status 08/28/2020 FINAL  Final  Blood culture (routine x 2)     Status: None (Preliminary result)   Collection Time: 08/27/20  7:11 PM   Specimen: BLOOD  Result Value Ref Range Status   Specimen Description BLOOD RIGHT ANTECUBITAL  Final   Special Requests   Final    BOTTLES DRAWN AEROBIC AND ANAEROBIC Blood Culture adequate volume   Culture   Final    NO GROWTH 2 DAYS Performed at [facility], [address] [place], [place] [address]    Report Status PENDING  Incomplete         Radiology Studies: MR BRAIN WO CONTRAST  Result Date: 08/27/2020 CLINICAL DATA:  Neuro deficit.  Stroke suspected. EXAM: MRI HEAD WITHOUT CONTRAST TECHNIQUE: Multiplanar, multiecho pulse sequences of the brain and surrounding structures were obtained without intravenous contrast. COMPARISON:  CT head without contrast 08/27/2020. CT a head and neck and CT perfusion 08/27/2020. MR  head without contrast 08/01/2020 FINDINGS: Brain: No acute infarct, hemorrhage, or mass lesion is present. The ventricles are of normal size. Mild atrophy and minimal white matter changes are within normal limits for age. The ventricles are of normal size. No significant extraaxial fluid collection is present. The internal auditory canals are within normal limits. 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 Cerebellum otherwise within normal limits. Vascular: Flow is present in the major intracranial arteries. Skull and upper cervical spine: Nasopharyngeal soft tissue prominence slightly improved from prior study. Heterogeneous signal changes again noted in the clivus and dens. Craniocervical junction otherwise within normal limits. Midline structures otherwise unremarkable. Sinuses/Orbits: Bilateral mastoid effusions are again noted. No obstructing nasopharyngeal lesion is evident. Mild mucosal thickening is present in the floor of the maxillary sinuses bilaterally. The paranasal sinuses and mastoid air cells are otherwise clear. Bilateral lens replacements are noted. Globes and orbits are otherwise unremarkable. IMPRESSION: 1. No acute intracranial abnormality. 2. Normal MRI appearance of the brain for age. 3. Bilateral mastoid effusions. 4. Nasopharyngeal soft tissue prominence slightly improved from prior study. Findings consistent with improving known skull base infection. Electronically Signed   By: Marin Robertshristopher  Mattern M.D.   On: 08/27/2020 16:51   US RENAL  Result Date: 08/27/2020 CLINICAL DATA:  Acute kidney injury EXAM: RENAL / URINARY TRACT ULTRASOUND COMPLETE COMPARISON:  CT 08/27/2020 FINDINGS: Right Kidney: Renal measurements: 10.9 x 6.8 x 6.3 cm = volume: 245.8 mL. Cortex is echogenic. No hydronephrosis. Trace perinephric fluid. Multiple cysts. Septated cyst in the mid right kidney measuring 3.1 x 3.3 x 2.4 cm. Cyst in the midpole measuring 2.5 x 1.9 x 1.7 cm. Left Kidney: Renal  measurements: 12.4 x 5.9 x 6 cm = volume: 227.3 mL. Cortex is echogenic. No hydronephrosis. Multiple  cysts, greater than 10. Septated cyst in the midpole measuring 2.5 x 2.2 x 1.9 cm. Cyst at the upper pole measuring 1.4 x 1.3 x 1.1 cm. Trace perinephric fluid. Bladder: Appears normal for degree of bladder distention. Other: Enlarged prostate. IMPRESSION: 1. Echogenic kidneys consistent with medical renal disease. No hydronephrosis. 2. Bilateral renal cysts 3. Enlarged prostate Electronically Signed   By: Jasmine Pang M.D.   On: 08/27/2020 19:56   CT Renal Stone Study  Result Date: 08/27/2020 CLINICAL DATA:  Acute renal failure EXAM: CT ABDOMEN AND PELVIS WITHOUT CONTRAST TECHNIQUE: Multidetector CT imaging of the abdomen and pelvis was performed following the standard protocol without IV contrast. COMPARISON:  None. FINDINGS: Lower chest: Trace bilateral pleural effusions with associated bibasilar opacities, likely atelectasis. Heart size is upper limits of normal. Trace pericardial effusion. Atherosclerotic calcifications of the coronary arteries. Calcified right hilar lymph nodes and right basilar granuloma. Hepatobiliary: Unremarkable unenhanced appearance of the liver. No focal liver lesion identified. Gallbladder within normal limits. No hyperdense gallstone. No biliary dilatation. Pancreas: Unremarkable. No pancreatic ductal dilatation or surrounding inflammatory changes. Spleen: Normal in size without focal abnormality. Adrenals/Urinary Tract: Unremarkable adrenal glands. There are multiple bilateral renal cysts, the largest within the upper pole of the right kidney measuring up to 4.0 cm. Additional bilateral subcentimeter renal lesions are too small to definitively characterize, but most likely also represent cysts. No renal stone or hydronephrosis. Nonspecific bilateral perinephric stranding. There is excreted contrast within the urinary bladder. The bladder base is obscured by artifact from patient's  hip prostheses. Stomach/Bowel: Stomach is within normal limits. Appendix not definitively visualized. No evidence of bowel wall thickening, distention, or inflammatory changes. Vascular/Lymphatic: Extensive atherosclerotic calcification throughout the aortoiliac axis. No aneurysm. No abdominopelvic lymphadenopathy. Reproductive: Obscured by artifact from hip prostheses. Other: No organized abdominopelvic fluid collection. No pneumoperitoneum. No abdominal wall hernia. Musculoskeletal: Partially visualized bilateral hip prostheses without apparent complication. Lumbosacral fusion hardware appears intact. No acute osseous findings. Mild anasarca. IMPRESSION: 1. No acute abdominopelvic findings. Specifically, no evidence of obstructive uropathy. 2.  Nonspecific bilateral perinephric stranding. 3. Trace bilateral pleural effusions with associated bibasilar opacities, likely atelectasis. 4. Trace pericardial effusion. 5. Bilateral renal cysts. Additional bilateral subcentimeter renal lesions are too small to definitively characterize, but most likely also represent cysts. Aortic Atherosclerosis (ICD10-I70.0). Electronically Signed   By: Duanne Guess D.O.   On: 08/27/2020 16:19        Scheduled Meds:  amLODipine  5 mg Oral Daily   Chlorhexidine Gluconate Cloth  6 each Topical Daily   cloNIDine  0.1 mg Transdermal Weekly   insulin aspart  0-5 Units Subcutaneous QHS   insulin aspart  0-6 Units Subcutaneous TID WC   levothyroxine  88 mcg Oral Daily   simvastatin  40 mg Oral QHS   timolol  1 drop Right Eye BID   tobramycin-dexamethasone  4 drop Both EARS BID   Continuous Infusions:  cefTAZidime (FORTAZ)  IV     heparin 1,000 Units/hr (08/29/20 1110)          Glade Lloyd, MD [facility] 08/29/2020, 1:29 PM

## 2020-08-29 NOTE — Care Management (Addendum)
Discharged June with [facility] RN,PT . Patient still active. [name] with [facility] aware of admission.   Patient also discharged at that time with  [facility] for OP palliative care. [name] with [facility] aware of admission.  1500 Received secure chat from [employer] with [employer]. Daughters have decided to take patient home ( [name] home) with hospice. Misty will order DME.NCM went to patient's room, daughters not at bedside. Called [name] [phone] and left voicemail. Will update [facility] once speak to [place].

## 2020-08-29 NOTE — Progress Notes (Signed)
ANTICOAGULATION CONSULT NOTE - Follow Up Consult  Pharmacy Consult for Heparin Indication: bilateral venous sinus thrombosis, sigmoid sinus and jugular vein  Allergies  Allergen Reactions   Tape Other (See Comments)    STICKY TAPE PEELS SKIN OFF     Patient Measurements: Height: 5\' 9"  (175.3 cm) Weight: 78.9 kg (173 lb 15.1 oz) IBW/kg (Calculated) : 70.7 Heparin Dosing Weight: 78.9 kg  Vital Signs: Temp: 97.8 F (36.6 C) (08/10 1100) Temp Source: Oral (08/10 1100) BP: 159/134 (08/10 1100) Pulse Rate: 62 (08/10 1100)  Labs: Recent Labs    08/27/20 1018 08/27/20 1827 08/27/20 1833 08/28/20 0030 08/28/20 0434 08/28/20 1300 08/28/20 2330 08/29/20 0233 08/29/20 1302  HGB 11.9*  --  12.9*  --  11.6*  --   --  10.3*  --   HCT 34.6*  --  38.0*  --  34.3*  --   --  30.3*  --   PLT 218  --   --   --  236  --   --  199  --   APTT 32  --   --   --  76* 137* 105*  --  71*  LABPROT 16.5*  --   --   --   --   --   --   --   --   INR 1.3*  --   --   --   --   --   --   --   --   HEPARINUNFRC  --   --   --   --  >1.10*  --   --   --  >1.10*  CREATININE 4.76*   < > 5.30* 5.26* 5.62*  --   --   --  7.21*  CKTOTAL  --   --   --  37*  --   --   --   --   --    < > = values in this interval not displayed.    Estimated Creatinine Clearance: 7.5 mL/min (A) (by C-G formula based on SCr of 7.21 mg/dL (H)).  Assessment: 87yo male with history of essential HTN, hypothyroidism, Bell's palsy in February on 2022 with residual left facial weakness, T2DM.  Last documented heparin rate was 1000 units/hr with aPTT 105 on 8/9 at 2330. aPTT on 8/10 at 1300 is 71, which is within therapeutic levels.  Goal of Therapy:  aPTT 66-102 seconds   Plan:  Continue heparin infusion at rate 1000 units/hr. Check PTT with morning labs.  10/10 08/29/2020,2:18 PM

## 2020-08-29 NOTE — Plan of Care (Signed)
  Problem: Elimination: Goal: Will not experience complications related to bowel motility Outcome: Progressing Goal: Will not experience complications related to urinary retention Outcome: Progressing   Problem: Safety: Goal: Ability to remain free from injury will improve Outcome: Progressing   

## 2020-08-29 NOTE — Progress Notes (Signed)
Patient's bp as checked 162/125, was about to medicated for PRN bp meds and explained to the family that the BP is high and the md has parameters for high bp. MD notified

## 2020-08-29 NOTE — Progress Notes (Signed)
[facility] Progress Note  Name: Nicolas James MRN: [id number] DOB: Dec 30, 1933   Subjective:  Per nurse he doesn't want to open his mouth and has been pulling at his leads and the condom cath.  His daughters refused antibiotic this am per nursing.  He hasn't had labs yet. BMP was drawn per lab tech during my exam. Per family not eating or taking PO meds.   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 Rogers Blocker for eval and recs re: AKI.  Pt's history is obtained via chart review and history provided by his dtr.    Briefly, pt started having bilateral ear issues since August 2021- was living in [place] and was placed on multiple courses of antibiotics really without relief.  He was not doing well by himself in [place] and so was brought to [place] to be closer to family.  Started following with ENT April 2022--> ear culture 05/30/20 with fluoroquinolone- resistant Pseudomonas aeruginosa.  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  Admitted 5/30-06/24/20 for headache and n/v.  Was found to have skull base osteomyelitis along with C1-C2 osteo as well.  He was placed on cefepime 2 g IV q 8 hrs which he has been receiving at home.  He was initially doing well with this but started developing n/v and confusion since 08/24/20.  Dtr says that he vomited 2x each day but was able to overall keep down at least 32-64 oz water daily.  No NSAIDs.  No known hypotensive events.  Did receive contrast today.  He was brought to the ED where he was found to have a Cr of 4.76 and K of 7.3. He received treatment for hyperkalemia with improvement to 3.8. He is still urinating.  Last Cr known was from [facility] labs- Cr 0.79 07/04/20.  He was seen by ID 08/09/20 where it was noted that  his imaging was improving but not resolved so antibiotics were extended to end date of 09/10/20.  He is taking metformin and lisinopril 10 mg daily.   Intake/Output Summary (Last 24 hours) at 08/29/2020 1247 Last data filed at 08/29/2020 1110 Gross per 24 hour  Intake 1818.25 ml  Output --  Net 1818.25 ml    Vitals:  Vitals:   08/28/20 1155 08/28/20 1502 08/29/20 0542 08/29/20 1100  BP:  (!) 171/75 (!) 155/67 (!) 159/134  Pulse:  70 63 62  Resp:  17 18 17   Temp:  97.6 F (36.4 C)  97.8 F (36.6 C)  TempSrc:  Oral  Oral  SpO2:      Weight: 78.9 kg     Height: 5\' 9"  (1.753 m)        Physical Exam:  General elderly male in bed curled under covers HEENT normocephalic atraumatic tracks visually then pulls covers over eyes Neck trachea midline Lungs clear to auscultation bilaterally normal work of breathing at rest  Heart unable to auscultate 2/2 agitation Abdomen soft nontender nondistended Extremities no edema  Neuro agitated and pulls covers over face and curls to the side; nonverbal for me and report of hard of hearing  Medications reviewed   Labs:  BMP Latest Ref Rng & Units 08/28/2020 08/28/2020 08/28/2020  Glucose 70 -  99 mg/dL 95 93 101(H)  BUN 8 - 23 mg/dL 81(H) - 83(H)  Creatinine 0.61 - 1.24 mg/dL 5.62(H) - 5.26(H)  Sodium 135 - 145 mmol/L 132(L) - 133(L)  Potassium 3.5 - 5.1 mmol/L 3.5 - 3.7  Chloride 98 - 111 mmol/L 97(L) - 100  CO2 22 - 32 mmol/L 17(L) - 16(L)  Calcium 8.9 - 10.3 mg/dL 8.9 - 9.1     Assessment/Plan:    AKI/ hyperkalemia: hyperkalemia treated effectively with Lokelma, insulin/ dextrose, Lasix, calcium gluconate.  Renal US with no hydro or obstruction.  Concerns include some not-captured hypotensive events resulting in pre-renal injury/ ATN, AIN d/t cephalosporin use.  Now also s/p contrast exposure in setting of AKI.  He was also hypertensive.  CK ok.  Up/cr ratio 3870 mg/g and UA with 100 mg/dl protein and UA with 6-10 RBC.  Cr 0.79 07/04/20 for  reference.  Complement ok.  - the patient would not ever want dialysis   - await today's BMP - continue supportive measures as align with goals of care - moving toward more of a comfort focus  - note ANA, ANCA, anti-GBM are in process             - hold metformin and lisinopril  - continue overall goals of care discussion per primary team   2. Proteinuria may be secondary to DM. Note work-up for AKI as above as well   3.  Acute encephalopathy: infectious vs metabolic per neuro--> cultures ordered, neuro was consulted.  EEG recommended.  neurotoxicity from cefepime seems to be a possibility  4. Osteomyelitis of the skull base: ID follows as OP--> team has consulted ID.  would recommend transition to an alternate to cefepime and appreciate ID  5.  HTN: on clonidine patch as not taking PO  6.  DM II hold metformin.  A1c is 8.4  7.  Sigmoid venous thrombosis and IJ thrombosis: anticoagulation per primary team   Dispo: admitted.  Continue goals of care discussions per primary team.  Wouldn't want dialysis per his daughter.  Doesn't want aggressive measures.  They would feel most comfortable if I see him tomorrow as well   Claudia Desanctis, MD 08/29/2020 1:06 PM

## 2020-08-30 DIAGNOSIS — G9341 Metabolic encephalopathy: Secondary | ICD-10-CM

## 2020-08-30 LAB — CBC
HCT: 34 % — ABNORMAL LOW (ref 39.0–52.0)
Hemoglobin: 11.5 g/dL — ABNORMAL LOW (ref 13.0–17.0)
MCH: 29 pg (ref 26.0–34.0)
MCHC: 33.8 g/dL (ref 30.0–36.0)
MCV: 85.9 fL (ref 80.0–100.0)
Platelets: 214 10*3/uL (ref 150–400)
RBC: 3.96 MIL/uL — ABNORMAL LOW (ref 4.22–5.81)
RDW: 14.6 % (ref 11.5–15.5)
WBC: 8.4 10*3/uL (ref 4.0–10.5)
nRBC: 0 % (ref 0.0–0.2)

## 2020-08-30 LAB — BASIC METABOLIC PANEL
Anion gap: 20 — ABNORMAL HIGH (ref 5–15)
BUN: 88 mg/dL — ABNORMAL HIGH (ref 8–23)
CO2: 14 mmol/L — ABNORMAL LOW (ref 22–32)
Calcium: 8.1 mg/dL — ABNORMAL LOW (ref 8.9–10.3)
Chloride: 106 mmol/L (ref 98–111)
Creatinine, Ser: 7.89 mg/dL — ABNORMAL HIGH (ref 0.61–1.24)
GFR, Estimated: 6 mL/min — ABNORMAL LOW (ref >60)
Glucose, Bld: 66 mg/dL — ABNORMAL LOW (ref 70–99)
Potassium: 3.7 mmol/L (ref 3.5–5.1)
Sodium: 140 mmol/L (ref 135–145)

## 2020-08-30 LAB — ANCA TITERS
Atypical P-ANCA titer: <1:20 {titer}
C-ANCA: <1:20 {titer}
P-ANCA: <1:20 {titer}

## 2020-08-30 LAB — HEPARIN LEVEL (UNFRACTIONATED): Heparin Unfractionated: >1.1 IU/mL — ABNORMAL HIGH (ref 0.30–0.70)

## 2020-08-30 LAB — APTT: aPTT: 97 seconds — ABNORMAL HIGH (ref 24–36)

## 2020-08-30 LAB — GLUCOSE, CAPILLARY: Glucose-Capillary: 72 mg/dL (ref 70–99)

## 2020-08-30 MED ORDER — MORPHINE SULFATE 10 MG/5ML PO SOLN: 5.0000 mg | ORAL | 0 refills | Status: AC | PRN

## 2020-08-30 MED ORDER — LORAZEPAM 2 MG/ML IJ SOLN
0.5000 mg | INTRAMUSCULAR | Status: DC | PRN
  Administered 2020-08-30 – 2020-08-31 (×2): 0.5 mg via INTRAVENOUS
  Filled 2020-08-30 (×2): qty 1

## 2020-08-30 MED ORDER — MORPHINE SULFATE (PF) 2 MG/ML IV SOLN: 2.0000 mg | INTRAVENOUS | Status: DC | PRN

## 2020-08-30 MED ORDER — LORAZEPAM 1 MG/0.5ML PO CONC: 1.0000 mg | ORAL | 0 refills | Status: AC | PRN

## 2020-08-30 NOTE — Plan of Care (Signed)
  Problem: Pain Managment: Goal: General experience of comfort will improve Outcome: Progressing   Problem: Safety: Goal: Ability to remain free from injury will improve Outcome: Progressing   

## 2020-08-30 NOTE — Discharge Summary (Signed)
Physician Discharge Summary  Nicolas James NFA:[id number] DOB: October 13, 1933 DOA: 08/27/2020  PCP: Farris Has, MD  Admit date: 08/27/2020 Discharge date: 08/30/2020  Admitted From: Home Disposition: Home with hospice  Recommendations for Outpatient Follow-up:  Outpatient follow-up with home hospice at earliest convenience  Home Health: Home with hospice Equipment/Devices: None  Discharge Condition: Poor CODE STATUS: DNR Diet recommendation: Per comfort measures  Brief/Interim Summary: 87 y.o. 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  On presentation, he was found to have acute renal failure.  Neurology, nephrology and ID were consulted.  During the hospitalization, his condition did not improve.  MRI of the brain showed no acute findings.  His renal function continued to worsen and patient declined renal biopsy or dialysis.  His oral intake is very poor.  After multiple discussions with family, family has decided to pursue comfort measures and take him home with hospice.  All antibiotics and other nonessential medications will be discontinued including Eliquis.  He will be discharged home with hospice once arrangements have been made.  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  Discharge to home hospice once bed is available.  Discharge Instructions   Allergies as of 08/30/2020       Reactions   Tape Other (See Comments)    STICKY TAPE PEELS SKIN OFF         Medication List     STOP taking these medications    apixaban 5 MG Tabs tablet Commonly known as: ELIQUIS   ceFEPime  IVPB Commonly known as: MAXIPIME   cholecalciferol 25 MCG (1000 UNIT) tablet Commonly known as: VITAMIN D   insulin detemir 100 UNIT/ML injection Commonly known as: LEVEMIR   insulin regular 100 units/mL injection Commonly known as: NOVOLIN R   levothyroxine 88 MCG tablet Commonly known as: SYNTHROID   lisinopril 10 MG tablet Commonly known as: ZESTRIL   metFORMIN 850 MG tablet Commonly known as: GLUCOPHAGE   simvastatin 40 MG tablet Commonly known as: ZOCOR   vitamin B-12 1000 MCG tablet Commonly known as: CYANOCOBALAMIN       TAKE these medications    LORazepam 1 MG/0.5ML Conc Take 1 mg by mouth every 4 (four) hours as needed (anxiety).   morphine 10 MG/5ML solution Take 2.5 mLs (5 mg total) by mouth every 2 (two) hours as needed for severe pain.   timolol 0.5 % ophthalmic solution Commonly known as: BETIMOL Place 1 drop into the right eye 2 (two) times daily.   tobramycin-dexamethasone ophthalmic solution Commonly known as: TOBRADEX Place 4 drops into both ears 2 (two) times daily.        Follow-up Information     Home hospice Follow up today.   Why: At earliest convenience               Allergies  Allergen Reactions   Tape Other (See Comments)    STICKY TAPE PEELS SKIN OFF     Consultations: Nephrology/neurology/ID.  Perative care evaluation is still pending.   Procedures/Studies: MR BRAIN WO  CONTRAST  Result Date: 08/27/2020 CLINICAL DATA:  Neuro deficit.  Stroke suspected. EXAM: MRI HEAD WITHOUT CONTRAST TECHNIQUE: Multiplanar, multiecho pulse sequences of the brain and surrounding structures were obtained without intravenous contrast. COMPARISON:  CT head without contrast 08/27/2020. CT a head and neck and CT perfusion 08/27/2020. MR head without contrast 08/01/2020  FINDINGS: Brain: No acute infarct, hemorrhage, or mass lesion is present. The ventricles are of normal size. Mild atrophy and minimal white matter changes are within normal limits for age. The ventricles are of normal size. No significant extraaxial fluid collection is present. The internal auditory canals are within normal limits. 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 Cerebellum otherwise within normal limits. Vascular: Flow is present in the major intracranial arteries. Skull and upper cervical spine: Nasopharyngeal soft tissue prominence slightly improved from prior study. Heterogeneous signal changes again noted in the clivus and dens. Craniocervical junction otherwise within normal limits. Midline structures otherwise unremarkable. Sinuses/Orbits: Bilateral mastoid effusions are again noted. No obstructing nasopharyngeal lesion is evident. Mild mucosal thickening is present in the floor of the maxillary sinuses bilaterally. The paranasal sinuses and mastoid air cells are otherwise clear. Bilateral lens replacements are noted. Globes and orbits are otherwise unremarkable. IMPRESSION: 1. No acute intracranial abnormality. 2. Normal MRI appearance of the brain for age. 3. Bilateral mastoid effusions. 4. Nasopharyngeal soft tissue prominence slightly improved from prior study. Findings consistent with improving known skull base infection. Electronically Signed   By: Marin Roberts M.D.   On: 08/27/2020 16:51   MR BRAIN W WO CONTRAST  Result Date: 08/02/2020 CLINICAL DATA:  Severe bilateral otomastoiditis. Skull base osteomyelitis. Rule out intracranial abscess or granuloma. EXAM: MRI HEAD WITHOUT AND WITH CONTRAST TECHNIQUE: Multiplanar, multiecho pulse sequences of the brain and surrounding structures were obtained without and with intravenous contrast. CONTRAST:  7mL GADAVIST GADOBUTROL 1 MMOL/ML IV SOLN COMPARISON:  MRI head with contrast 06/19/2020 FINDINGS: Brain: Mild atrophy.  Mild ventricular enlargement due to atrophy, stable. Scattered small white matter hyperintensities, mild in degree. Mild hyperintensity in the pons. Negative for acute infarct or hemorrhage. Following contrast infusion, there is mild dural thickening enhancement in the posterior fossa bilaterally which appears improved. Improved dural enhancement above the tegmen of the mastoid sinus bilaterally. No intracranial abscess. No brain edema. Vascular: Normal arterial flow voids Filling defects in the sigmoid sinus bilaterally likely due to thrombus. There is compression of the jugular vein due to extensive soft tissue swelling. Transverse sinus and proximal sigmoid sinus show normal enhancement. Skull and upper cervical spine: Improved edema in the skull base and adjacent soft tissues inferior to the skull base. Improved edema at C1-2 compatible with septic arthritis. Sinuses/Orbits: Mucosal edema paranasal sinuses, with interval improvement especially in the sphenoid sinus. Bilateral cataract extraction Bilateral mastoid effusion with interval improvement. Other: None IMPRESSION: 1. Findings compatible with skull base osteomyelitis and infection in the soft tissues inferior to the skull base. Overall improvement since the prior study. Improvement in posterior nasopharyngeal soft tissue abscess. There is improvement in dural enhancement above the tegmen bilaterally. No intracranial abscess. Diffuse dural thickening in the posterior fossa presumably due to intracranial spread of infection appears improved. 2. Mild thrombus in the sigmoid sinus and proximal jugular vein similar to the prior study. 3. Negative for acute cerebral infarct Electronically Signed   By: Marlan Palau M.D.   On: 08/02/2020 15:34   MR CERVICAL SPINE W WO CONTRAST  Result Date: 08/02/2020 CLINICAL DATA:  Severe bilateral otomastoiditis.  Skull base osteomyelitis with phlegmon. EXAM: MRI CERVICAL SPINE WITHOUT AND WITH CONTRAST TECHNIQUE:  Multiplanar and multiecho pulse sequences of the cervical spine, to include the craniocervical junction and cervicothoracic junction, were obtained without and with intravenous contrast. CONTRAST:  51mL GADAVIST GADOBUTROL 1 MMOL/ML IV SOLN COMPARISON:  MRI cervical spine 06/19/2020 FINDINGS: Alignment: Mild anterolisthesis C3-4, C4-5, C5-6 Vertebrae: Negative for fracture or mass C1-2 arthropathy with bone marrow edema. Synovial enhancement at C1-2 has improved in the interval likely due to improving septic arthritis. No other areas of infection in the cervical spine. Cord: No cord compression.  Normal cord signal. Posterior Fossa, vertebral arteries, paraspinal tissues: Interval improvement in soft tissue swelling and enhancement in the soft tissues below the skull base compatible with improving infection. Nonenhancing fluid collection in the posterior retropharyngeal soft tissues has improved in the interval with decreased surrounding soft tissue swelling. Disc levels: C1-2: Improving infection at this level.  No abscess. C2-3: Negative C3-4: Central disc protrusion and spurring. Bilateral facet degeneration. Moderate foraminal narrowing bilaterally. C4-5: Disc degeneration and diffuse uncinate spurring. Asymmetric facet hypertrophy on the left. Mild spinal stenosis. Moderate to severe left foraminal encroachment. C5-6: Mild disc degeneration and moderate to advanced facet degeneration. Mild to moderate foraminal stenosis bilaterally. C6-7: Mild disc degeneration. Moderate to severe facet degeneration. Mild foraminal narrowing bilaterally C7-T1: Disc and facet degeneration with mild to moderate foraminal narrowing bilaterally. IMPRESSION: 1. Improving soft tissue swelling and enhancement below the skull base compatible with improving infection. Posterior retropharyngeal abscess has improved. 2. Probable infection at C1-2 has improved. No new areas of infection or abscess in the cervical spine. Electronically  Signed   By: Marlan Palau M.D.   On: 08/02/2020 15:21   NM Bone Scan 3 Phase  Result Date: 08/14/2020 CLINICAL DATA:  History of bilateral knee arthroplasty. Rule out right total knee arthroplasty device loosening. EXAM: NUCLEAR MEDICINE 3-PHASE BONE SCAN TECHNIQUE: Radionuclide angiographic images, immediate static blood pool images, and 3-hour delayed static images were obtained of the knees after intravenous injection of radiopharmaceutical. RADIOPHARMACEUTICALS:  21.8 mCi Tc-42m MDP IV COMPARISON:  None. FINDINGS: Vascular phase: No significant abnormal asymmetric increased blood flow to the lower extremities. Blood pool phase: Asymmetric increased tracer uptake localizes to the medial and lateral femoral condyles of the right knee and medial tibial plateau. There is also mild asymmetric increased blood pool activity localizing to the patella. Delayed phase: Asymmetric increased radiotracer uptake surrounding the femoral components of the right knee arthroplasty device noted. There is also asymmetric increased uptake localizing to the right patella, right medial tibial plateau, and around the distal portion of the tibial component of the right knee arthroplasty device. IMPRESSION: 1. Nonspecific, asymmetric increased uptake on the blood pool and delayed phase imaging localizes to the femoral and tibial components of the right knee arthroplasty device. Correlate for any clinical signs or symptoms of arthroplasty device loosening. Electronically Signed   By: Signa Kell M.D.   On: 08/14/2020 09:55   US RENAL  Result Date: 08/27/2020 CLINICAL DATA:  Acute kidney injury EXAM: RENAL / URINARY TRACT ULTRASOUND COMPLETE COMPARISON:  CT 08/27/2020 FINDINGS: Right Kidney: Renal measurements: 10.9 x 6.8 x 6.3 cm = volume: 245.8 mL. Cortex is echogenic. No hydronephrosis. Trace perinephric fluid. Multiple cysts. Septated cyst in the mid right kidney measuring 3.1 x 3.3 x 2.4 cm. Cyst in the midpole measuring  2.5 x 1.9 x 1.7 cm. Left Kidney: Renal measurements: 12.4 x 5.9 x 6 cm = volume: 227.3 mL.  Cortex is echogenic. No hydronephrosis. Multiple cysts, greater than 10. Septated cyst in the midpole measuring 2.5 x 2.2 x 1.9 cm. Cyst at the upper pole measuring 1.4 x 1.3 x 1.1 cm. Trace perinephric fluid. Bladder: Appears normal for degree of bladder distention. Other: Enlarged prostate. IMPRESSION: 1. Echogenic kidneys consistent with medical renal disease. No hydronephrosis. 2. Bilateral renal cysts 3. Enlarged prostate Electronically Signed   By: Jasmine Pang M.D.   On: 08/27/2020 19:56   CT CEREBRAL PERFUSION W CONTRAST  Result Date: 08/27/2020 CLINICAL DATA:  Neuro deficit, acute, stroke suspected EXAM: CT ANGIOGRAPHY HEAD AND NECK CT VENOGRAM HEAD CT PERFUSION BRAIN TECHNIQUE: Multidetector CT imaging of the head and neck was performed using the standard protocol during bolus administration of intravenous contrast. Multiplanar CT image reconstructions and MIPs were obtained to evaluate the vascular anatomy. Carotid stenosis measurements (when applicable) are obtained utilizing NASCET criteria, using the distal internal carotid diameter as the denominator. Delayed multidetector CT imaging of the head was performed following the administration of intravenous contrast. Multiplanar CT image reconstructions and MIPS were obtained to evaluate the venous anatomy. Multiphase CT imaging of the brain was performed following IV bolus contrast injection. Subsequent parametric perfusion maps were calculated using RAPID software. CONTRAST:  OMNIPAQUE IOHEXOL 350 MG/ML SOLN COMPARISON:  Prior CT and MR imaging FINDINGS: CTV HEAD Superior sagittal sinus, straight sinus, vein of Galen, and internal cerebral veins are patent. Bilateral transverse and sigmoid sinuses are patent. There is suspected persistent nonocclusive thrombus within the proximal internal jugular veins. CTA NECK Aortic arch: Mild mixed plaque along the  arch. Great vessel origins are patent. Right carotid system: Patent. Mild calcified plaque along the common carotid. Mixed plaque at the ICA origin without stenosis. Left carotid system: Patent. Focal noncalcified plaque along the common carotid causing less than 50% stenosis. Calcified plaque along proximal left ICA without stenosis. Vertebral arteries: Patent. Left vertebral artery is slightly dominant. No stenosis. Skeleton: Cervical spine degenerative changes. Skull base irregularity likely related to prior osteomyelitis. Other neck: No evidence of progressive infection. Upper chest: Included upper lungs are clear. Review of the MIP images confirms the above findings CTA HEAD Anterior circulation: Intracranial internal carotid arteries are patent with calcified plaque but no significant stenosis. Anterior and middle cerebral arteries are patent. Posterior circulation: Intracranial vertebral arteries are patent. There is calcified plaque along the left vertebral artery with mild stenosis. Basilar artery is patent. Major cerebellar artery origins are patent. Posterior cerebral arteries are patent. Review of the MIP images confirms the above findings CT Brain Perfusion Findings: CBF (<30%) Volume: 0mL Perfusion (Tmax>6.0s) volume: 0mL Mismatch Volume: 0mL Infarction Location: None. IMPRESSION: No large vessel arterial occlusion or hemodynamically significant stenosis. Perfusion imaging demonstrates no evidence of core infarction or penumbra. Suspect persistent nonocclusive thrombus within the proximal internal jugular veins bilaterally. Electronically Signed   By: Guadlupe Spanish M.D.   On: 08/27/2020 11:24   DG Chest Portable 1 View  Result Date: 08/27/2020 CLINICAL DATA:  Confused, found last night holding head, LEFT facial drooping and confusion today EXAM: PORTABLE CHEST 1 VIEW COMPARISON:  Portable exam 1207 hours compared to 06/19/2020 FINDINGS: Borderline enlargement of cardiac silhouette. Mediastinal  contours and pulmonary vascularity normal. Atherosclerotic calcification aorta. Tip of RIGHT arm PICC line projects over SVC. Lungs clear. No infiltrate, pleural effusion, or pneumothorax. Bones demineralized. IMPRESSION: No acute abnormalities. Aortic Atherosclerosis (ICD10-I70.0). Electronically Signed   By: Ulyses Southward M.D.   On: 08/27/2020 12:39   CT  Renal Stone Study  Result Date: 08/27/2020 CLINICAL DATA:  Acute renal failure EXAM: CT ABDOMEN AND PELVIS WITHOUT CONTRAST TECHNIQUE: Multidetector CT imaging of the abdomen and pelvis was performed following the standard protocol without IV contrast. COMPARISON:  None. FINDINGS: Lower chest: Trace bilateral pleural effusions with associated bibasilar opacities, likely atelectasis. Heart size is upper limits of normal. Trace pericardial effusion. Atherosclerotic calcifications of the coronary arteries. Calcified right hilar lymph nodes and right basilar granuloma. Hepatobiliary: Unremarkable unenhanced appearance of the liver. No focal liver lesion identified. Gallbladder within normal limits. No hyperdense gallstone. No biliary dilatation. Pancreas: Unremarkable. No pancreatic ductal dilatation or surrounding inflammatory changes. Spleen: Normal in size without focal abnormality. Adrenals/Urinary Tract: Unremarkable adrenal glands. There are multiple bilateral renal cysts, the largest within the upper pole of the right kidney measuring up to 4.0 cm. Additional bilateral subcentimeter renal lesions are too small to definitively characterize, but most likely also represent cysts. No renal stone or hydronephrosis. Nonspecific bilateral perinephric stranding. There is excreted contrast within the urinary bladder. The bladder base is obscured by artifact from patient's hip prostheses. Stomach/Bowel: Stomach is within normal limits. Appendix not definitively visualized. No evidence of bowel wall thickening, distention, or inflammatory changes. Vascular/Lymphatic:  Extensive atherosclerotic calcification throughout the aortoiliac axis. No aneurysm. No abdominopelvic lymphadenopathy. Reproductive: Obscured by artifact from hip prostheses. Other: No organized abdominopelvic fluid collection. No pneumoperitoneum. No abdominal wall hernia. Musculoskeletal: Partially visualized bilateral hip prostheses without apparent complication. Lumbosacral fusion hardware appears intact. No acute osseous findings. Mild anasarca. IMPRESSION: 1. No acute abdominopelvic findings. Specifically, no evidence of obstructive uropathy. 2.  Nonspecific bilateral perinephric stranding. 3. Trace bilateral pleural effusions with associated bibasilar opacities, likely atelectasis. 4. Trace pericardial effusion. 5. Bilateral renal cysts. Additional bilateral subcentimeter renal lesions are too small to definitively characterize, but most likely also represent cysts. Aortic Atherosclerosis (ICD10-I70.0). Electronically Signed   By: Duanne GuessNicholas  Plundo D.O.   On: 08/27/2020 16:19   CT VENOGRAM HEAD  Result Date: 08/27/2020 CLINICAL DATA:  Neuro deficit, acute, stroke suspected EXAM: CT ANGIOGRAPHY HEAD AND NECK CT VENOGRAM HEAD CT PERFUSION BRAIN TECHNIQUE: Multidetector CT imaging of the head and neck was performed using the standard protocol during bolus administration of intravenous contrast. Multiplanar CT image reconstructions and MIPs were obtained to evaluate the vascular anatomy. Carotid stenosis measurements (when applicable) are obtained utilizing NASCET criteria, using the distal internal carotid diameter as the denominator. Delayed multidetector CT imaging of the head was performed following the administration of intravenous contrast. Multiplanar CT image reconstructions and MIPS were obtained to evaluate the venous anatomy. Multiphase CT imaging of the brain was performed following IV bolus contrast injection. Subsequent parametric perfusion maps were calculated using RAPID software. CONTRAST:   115mL OMNIPAQUE IOHEXOL 350 MG/ML SOLN COMPARISON:  Prior CT and MR imaging FINDINGS: CTV HEAD Superior sagittal sinus, straight sinus, vein of Galen, and internal cerebral veins are patent. Bilateral transverse and sigmoid sinuses are patent. There is suspected persistent nonocclusive thrombus within the proximal internal jugular veins. CTA NECK Aortic arch: Mild mixed plaque along the arch. Great vessel origins are patent. Right carotid system: Patent. Mild calcified plaque along the common carotid. Mixed plaque at the ICA origin without stenosis. Left carotid system: Patent. Focal noncalcified plaque along the common carotid causing less than 50% stenosis. Calcified plaque along proximal left ICA without stenosis. Vertebral arteries: Patent. Left vertebral artery is slightly dominant. No stenosis. Skeleton: Cervical spine degenerative changes. Skull base irregularity likely related to prior osteomyelitis. Other  neck: No evidence of progressive infection. Upper chest: Included upper lungs are clear. Review of the MIP images confirms the above findings CTA HEAD Anterior circulation: Intracranial internal carotid arteries are patent with calcified plaque but no significant stenosis. Anterior and middle cerebral arteries are patent. Posterior circulation: Intracranial vertebral arteries are patent. There is calcified plaque along the left vertebral artery with mild stenosis. Basilar artery is patent. Major cerebellar artery origins are patent. Posterior cerebral arteries are patent. Review of the MIP images confirms the above findings CT Brain Perfusion Findings: CBF (<30%) Volume: 0mL Perfusion (Tmax>6.0s) volume: 0mL Mismatch Volume: 0mL Infarction Location: None. IMPRESSION: No large vessel arterial occlusion or hemodynamically significant stenosis. Perfusion imaging demonstrates no evidence of core infarction or penumbra. Suspect persistent nonocclusive thrombus within the proximal internal jugular veins  bilaterally. Electronically Signed   By: Guadlupe Spanish M.D.   On: 08/27/2020 11:24   CT HEAD CODE STROKE WO CONTRAST  Result Date: 08/27/2020 CLINICAL DATA:  Code stroke. Neuro deficit, acute, stroke suspected. Diabetes and hypertension. EXAM: CT HEAD WITHOUT CONTRAST TECHNIQUE: Contiguous axial images were obtained from the base of the skull through the vertex without intravenous contrast. COMPARISON:  MRI 08/01/2020 FINDINGS: Brain: Age related atrophy. No sign of acute infarction, mass lesion, hemorrhage, hydrocephalus or extra-axial collection. Vascular: There is atherosclerotic calcification of the major vessels at the base of the brain. Skull: No evidence gross destructive change of the skull base. See prior imaging. Sinuses/Orbits: Mucosal thickening of the maxillary sinuses. Persistent bilateral mastoid effusions, improved since the previous imaging. There is no longer any fluid in the middle ears. Other: None ASPECTS ([place] Stroke Program Early CT Score) - Ganglionic level infarction (caudate, lentiform nuclei, internal capsule, insula, M1-M3 cortex): 7 - Supraganglionic infarction (M4-M6 cortex): 3 Total score (0-10 with 10 being normal): 10 IMPRESSION: 1. No acute or focal brain finding. 2. ASPECTS is 10 3. Less fluid in the mastoid air cells. Resolution of fluid in the middle ears. 4. These results were communicated to Dr. Wilford Corner at 10:37 am on 08/27/2020 by text page via the [facility] messaging system. Electronically Signed   By: Paulina Fusi M.D.   On: 08/27/2020 10:39   CT ANGIO HEAD NECK W WO CM (CODE STROKE)  Result Date: 08/27/2020 CLINICAL DATA:  Neuro deficit, acute, stroke suspected EXAM: CT ANGIOGRAPHY HEAD AND NECK CT VENOGRAM HEAD CT PERFUSION BRAIN TECHNIQUE: Multidetector CT imaging of the head and neck was performed using the standard protocol during bolus administration of intravenous contrast. Multiplanar CT image reconstructions and MIPs were obtained to evaluate the vascular anatomy.  Carotid stenosis measurements (when applicable) are obtained utilizing NASCET criteria, using the distal internal carotid diameter as the denominator. Delayed multidetector CT imaging of the head was performed following the administration of intravenous contrast. Multiplanar CT image reconstructions and MIPS were obtained to evaluate the venous anatomy. Multiphase CT imaging of the brain was performed following IV bolus contrast injection. Subsequent parametric perfusion maps were calculated using RAPID software. CONTRAST:  OMNIPAQUE IOHEXOL 350 MG/ML SOLN COMPARISON:  Prior CT and MR imaging FINDINGS: CTV HEAD Superior sagittal sinus, straight sinus, vein of Galen, and internal cerebral veins are patent. Bilateral transverse and sigmoid sinuses are patent. There is suspected persistent nonocclusive thrombus within the proximal internal jugular veins. CTA NECK Aortic arch: Mild mixed plaque along the arch. Great vessel origins are patent. Right carotid system: Patent. Mild calcified plaque along the common carotid. Mixed plaque at the ICA origin without stenosis. Left carotid  system: Patent. Focal noncalcified plaque along the common carotid causing less than 50% stenosis. Calcified plaque along proximal left ICA without stenosis. Vertebral arteries: Patent. Left vertebral artery is slightly dominant. No stenosis. Skeleton: Cervical spine degenerative changes. Skull base irregularity likely related to prior osteomyelitis. Other neck: No evidence of progressive infection. Upper chest: Included upper lungs are clear. Review of the MIP images confirms the above findings CTA HEAD Anterior circulation: Intracranial internal carotid arteries are patent with calcified plaque but no significant stenosis. Anterior and middle cerebral arteries are patent. Posterior circulation: Intracranial vertebral arteries are patent. There is calcified plaque along the left vertebral artery with mild stenosis. Basilar artery is  patent. Major cerebellar artery origins are patent. Posterior cerebral arteries are patent. Review of the MIP images confirms the above findings CT Brain Perfusion Findings: CBF (<30%) Volume: 0mL Perfusion (Tmax>6.0s) volume: 0mL Mismatch Volume: 0mL Infarction Location: None. IMPRESSION: No large vessel arterial occlusion or hemodynamically significant stenosis. Perfusion imaging demonstrates no evidence of core infarction or penumbra. Suspect persistent nonocclusive thrombus within the proximal internal jugular veins bilaterally. Electronically Signed   By: Guadlupe Spanish M.D.   On: 08/27/2020 11:24      Subjective: Patient seen and examined at bedside.  Wakes up slightly, hardly participates in conversation.  Oral intake is still very poor as per nursing staff  Discharge Exam: Vitals:   08/29/20 2019 08/30/20 0620  BP: (!) 173/89 (!) 187/88  Pulse: 66 74  Resp: 17 17  Temp:  98.3 F (36.8 C)  SpO2:      General: Wakes up slightly, extremely poor historian.  Currently on room air.  Elderly male lying in bed.  No acute distress currently.  Cardiovascular: rate controlled, S1/S2 + Respiratory: bilateral decreased breath sounds at bases Abdominal: Soft, NT, ND, bowel sounds + Extremities: Trace lower extremity edema; no cyanosis    The results of significant diagnostics from this hospitalization (including imaging, microbiology, ancillary and laboratory) are listed below for reference.     Microbiology: Recent Results (from the past 240 hour(s))  Resp Panel by RT-PCR (Flu A&B, Covid) Nasopharyngeal Swab     Status: None   Collection Time: 08/27/20 10:18 AM   Specimen: Nasopharyngeal Swab; Nasopharyngeal(NP) swabs in vial transport medium  Result Value Ref Range Status   SARS Coronavirus 2 by RT PCR NEGATIVE NEGATIVE Final    Comment: (NOTE) SARS-CoV-2 target nucleic acids are NOT DETECTED.  The SARS-CoV-2 RNA is generally detectable in upper respiratory specimens during the  acute phase of infection. The lowest concentration of SARS-CoV-2 viral copies this assay can detect is 138 copies/mL. A negative result does not preclude SARS-Cov-2 infection and should not be used as the sole basis for treatment or other patient management decisions. A negative result may occur with  improper specimen collection/handling, submission of specimen other than nasopharyngeal swab, presence of viral mutation(s) within the areas targeted by this assay, and inadequate number of viral copies(<138 copies/mL). A negative result must be combined with clinical observations, patient history, and epidemiological information. The expected result is Negative.  Fact Sheet for Patients:  BloggerCourse.com  Fact Sheet for Healthcare Providers:  SeriousBroker.it  This test is no t yet approved or cleared by the [place] FDA and  has been authorized for detection and/or diagnosis of SARS-CoV-2 by FDA under an Emergency Use Authorization (EUA). This EUA will remain  in effect (meaning this test can be used) for the duration of the COVID-19 declaration under Section 564(b)(1) of the  Act, 21 U.S.C.section 360bbb-3(b)(1), unless the authorization is terminated  or revoked sooner.       Influenza A by PCR NEGATIVE NEGATIVE Final   Influenza B by PCR NEGATIVE NEGATIVE Final    Comment: (NOTE) The Xpert Xpress SARS-CoV-2/FLU/RSV plus assay is intended as an aid in the diagnosis of influenza from Nasopharyngeal swab specimens and should not be used as a sole basis for treatment. Nasal washings and aspirates are unacceptable for Xpert Xpress SARS-CoV-2/FLU/RSV testing.  Fact Sheet for Patients: BloggerCourse.com  Fact Sheet for Healthcare Providers: SeriousBroker.it  This test is not yet approved or cleared by the [place] FDA and has been authorized for detection and/or  diagnosis of SARS-CoV-2 by FDA under an Emergency Use Authorization (EUA). This EUA will remain in effect (meaning this test can be used) for the duration of the COVID-19 declaration under Section 564(b)(1) of the Act, 21 U.S.C. section 360bbb-3(b)(1), unless the authorization is terminated or revoked.  Performed at [facility], [address], [place], [place] [address]   Blood culture (routine x 2)     Status: None (Preliminary result)   Collection Time: 08/27/20 12:08 PM   Specimen: BLOOD RIGHT ARM  Result Value Ref Range Status   Specimen Description BLOOD RIGHT ARM  Final   Special Requests   Final    BOTTLES DRAWN AEROBIC AND ANAEROBIC Blood Culture adequate volume   Culture   Final    NO GROWTH 3 DAYS Performed at [facility], [address] [place], [place] [address]    Report Status PENDING  Incomplete  Urine Culture     Status: Abnormal   Collection Time: 08/27/20  6:29 PM   Specimen: Urine, Clean Catch  Result Value Ref Range Status   Specimen Description URINE, CLEAN CATCH  Final   Special Requests NONE  Final   Culture (A)  Final    <10,000 COLONIES/mL INSIGNIFICANT GROWTH Performed at [facility], [address] [place], [place] [address]    Report Status 08/28/2020 FINAL  Final  Blood culture (routine x 2)     Status: None (Preliminary result)   Collection Time: 08/27/20  7:11 PM   Specimen: BLOOD  Result Value Ref Range Status   Specimen Description BLOOD RIGHT ANTECUBITAL  Final   Special Requests   Final    BOTTLES DRAWN AEROBIC AND ANAEROBIC Blood Culture adequate volume   Culture   Final    NO GROWTH 3 DAYS Performed at [facility], [address] [place], [place] [address]    Report Status PENDING  Incomplete     Labs: BNP (last 3 results) No results for input(s): BNP in the last 8760 hours. Basic Metabolic Panel: Recent Labs  Lab 08/27/20 1827 08/27/20 1833 08/28/20 0030 08/28/20 0434 08/29/20 1302  08/30/20 0604  NA 133* 135 133* 132* 135 140  K 3.7 3.8 3.7 3.5 3.6 3.7  CL 99 101 100 97* 105 106  CO2 21*  --  16* 17* 14* 14*  GLUCOSE 82 79 101* 95  93 82 66*  BUN 81* 76* 83* 81* 88* 88*  CREATININE 5.01* 5.30* 5.26* 5.62* 7.21* 7.89*  CALCIUM 9.2  --  9.1 8.9 8.0* 8.1*  PHOS  --   --  3.1  --   --   --    Liver Function Tests: Recent Labs  Lab 08/27/20 1018 08/28/20 0030 08/28/20 0434  AST 51*  --  25  ALT 21  --  15  ALKPHOS 53  --  56  BILITOT 1.8*  --  0.6  PROT 5.9*  --  5.7*  ALBUMIN 2.4* 2.3* 2.2*   No results for input(s): LIPASE, AMYLASE in the last 168 hours. No results for input(s): AMMONIA in the last 168 hours. CBC: Recent Labs  Lab 08/27/20 1018 08/27/20 1833 08/28/20 0434 08/29/20 0233 08/30/20 0604  WBC 6.7  --  9.7 8.0 8.4  NEUTROABS 5.0  --   --   --   --   HGB 11.9* 12.9* 11.6* 10.3* 11.5*  HCT 34.6* 38.0* 34.3* 30.3* 34.0*  MCV 85.6  --  86.6 86.1 85.9  PLT 218  --  236 199 214   Cardiac Enzymes: Recent Labs  Lab 08/28/20 0030  CKTOTAL 37*   BNP: Invalid input(s): POCBNP CBG: Recent Labs  Lab 08/29/20 0704 08/29/20 1215 08/29/20 1753 08/29/20 2020 08/30/20 0619  GLUCAP 86 82 78 83 72   D-Dimer No results for input(s): DDIMER in the last 72 hours. Hgb A1c Recent Labs    08/28/20 0434  HGBA1C 8.4*   Lipid Profile No results for input(s): CHOL, HDL, LDLCALC, TRIG, CHOLHDL, LDLDIRECT in the last 72 hours. Thyroid function studies Recent Labs    08/28/20 0026  TSH 5.896*   Anemia work up Recent Labs    08/28/20 0026  VITAMINB12 748   Urinalysis    Component Value Date/Time   COLORURINE STRAW (A) 08/27/2020 1920   APPEARANCEUR CLEAR 08/27/2020 1920   LABSPEC 1.010 08/27/2020 1920   PHURINE 8.0 08/27/2020 1920   GLUCOSEU 50 (A) 08/27/2020 1920   HGBUR SMALL (A) 08/27/2020 1920   BILIRUBINUR NEGATIVE 08/27/2020 1920   KETONESUR NEGATIVE 08/27/2020 1920   PROTEINUR 100 (A) 08/27/2020 1920   NITRITE NEGATIVE  08/27/2020 1920   LEUKOCYTESUR NEGATIVE 08/27/2020 1920   Sepsis Labs Invalid input(s): PROCALCITONIN,  WBC,  LACTICIDVEN Microbiology Recent Results (from the past 240 hour(s))  Resp Panel by RT-PCR (Flu A&B, Covid) Nasopharyngeal Swab     Status: None   Collection Time: 08/27/20 10:18 AM   Specimen: Nasopharyngeal Swab; Nasopharyngeal(NP) swabs in vial transport medium  Result Value Ref Range Status   SARS Coronavirus 2 by RT PCR NEGATIVE NEGATIVE Final    Comment: (NOTE) SARS-CoV-2 target nucleic acids are NOT DETECTED.  The SARS-CoV-2 RNA is generally detectable in upper respiratory specimens during the acute phase of infection. The lowest concentration of SARS-CoV-2 viral copies this assay can detect is 138 copies/mL. A negative result does not preclude SARS-Cov-2 infection and should not be used as the sole basis for treatment or other patient management decisions. A negative result may occur with  improper specimen collection/handling, submission of specimen other than nasopharyngeal swab, presence of viral mutation(s) within the areas targeted by this assay, and inadequate number of viral copies(<138 copies/mL). A negative result must be combined with clinical observations, patient history, and epidemiological information. The expected result is Negative.  Fact Sheet for Patients:  BloggerCourse.com  Fact Sheet for Healthcare Providers:  SeriousBroker.it  This test is no t yet approved or cleared by the [place] FDA and  has been authorized for detection and/or diagnosis of SARS-CoV-2 by FDA under an Emergency Use Authorization (EUA). This EUA will remain  in effect (meaning this test can be used) for the duration of the COVID-19 declaration under Section 564(b)(1) of the Act, 21 U.S.C.section 360bbb-3(b)(1), unless the authorization is terminated  or revoked sooner.       Influenza A  by PCR NEGATIVE NEGATIVE  Final   Influenza B by PCR NEGATIVE NEGATIVE Final    Comment: (NOTE) The Xpert Xpress SARS-CoV-2/FLU/RSV plus assay is intended as an aid in the diagnosis of influenza from Nasopharyngeal swab specimens and should not be used as a sole basis for treatment. Nasal washings and aspirates are unacceptable for Xpert Xpress SARS-CoV-2/FLU/RSV testing.  Fact Sheet for Patients: BloggerCourse.com  Fact Sheet for Healthcare Providers: SeriousBroker.it  This test is not yet approved or cleared by the [place] FDA and has been authorized for detection and/or diagnosis of SARS-CoV-2 by FDA under an Emergency Use Authorization (EUA). This EUA will remain in effect (meaning this test can be used) for the duration of the COVID-19 declaration under Section 564(b)(1) of the Act, 21 U.S.C. section 360bbb-3(b)(1), unless the authorization is terminated or revoked.  Performed at [facility], [address] [place], [place] [address]   Blood culture (routine x 2)     Status: None (Preliminary result)   Collection Time: 08/27/20 12:08 PM   Specimen: BLOOD RIGHT ARM  Result Value Ref Range Status   Specimen Description BLOOD RIGHT ARM  Final   Special Requests   Final    BOTTLES DRAWN AEROBIC AND ANAEROBIC Blood Culture adequate volume   Culture   Final    NO GROWTH 3 DAYS Performed at [facility], [address], [place], [place] [address]    Report Status PENDING  Incomplete  Urine Culture     Status: Abnormal   Collection Time: 08/27/20  6:29 PM   Specimen: Urine, Clean Catch  Result Value Ref Range Status   Specimen Description URINE, CLEAN CATCH  Final   Special Requests NONE  Final   Culture (A)  Final    <10,000 COLONIES/mL INSIGNIFICANT GROWTH Performed at [facility], [address], [place], [place] [address]    Report Status 08/28/2020 FINAL  Final  Blood culture (routine x 2)     Status: None  (Preliminary result)   Collection Time: 08/27/20  7:11 PM   Specimen: BLOOD  Result Value Ref Range Status   Specimen Description BLOOD RIGHT ANTECUBITAL  Final   Special Requests   Final    BOTTLES DRAWN AEROBIC AND ANAEROBIC Blood Culture adequate volume   Culture   Final    NO GROWTH 3 DAYS Performed at [facility], [address], [place], [place] [address]    Report Status PENDING  Incomplete     Time coordinating discharge: 35 minutes  SIGNED:   Glade Lloyd, MD  [facility] 08/30/2020, 10:26 AM

## 2020-08-30 NOTE — Progress Notes (Signed)
Redge Gainer [phone] Civil engineer, contracting [facility]) Hospital Liaison RN note   Received request from [facility] for hospice services at home after discharge. Chart and patient information under review by Hospice physician. Hospice eligibility confirmed.   Visited patient at bedside along with daughter and patients sister.  Per discussion, the plan is for discharge home by EMS likely on Friday morning due to family request. [facility] will be admitting patient Friday at 1pm so an early morning discharge is preferred.   DME needs discussed. Patient has the following equipment in the home. Walker  Patient/family requests the following equipment for deliver Hospital bed, overbed table, wheelchair, BSC, and slide board. DME has been delivered. Address has been verified and is correct in the chart. [name] at [phone] is contact.   Please send signed and completed DNR with patient/family. Please provide symptoms at discharge as needed for ongoing symptom management. Pain, agitation, and comfort.    Please call with any hospice related questions or concerns.   Thank you for the opportunity to participate in this patient's care.   Yolande Jolly, RN, BSN [facility] Liaison [phone]

## 2020-08-30 NOTE — Progress Notes (Addendum)
ID Brief Note  I met with patients elder daughter Jeral Fruit this morning and had a long conversation about her questions/concerns. I also spoke with Kristin Bruins ( another daughter over phone who is the primary care taker). She told me her father wanted to fight the infection in the last admission and opted for PICC line and IV abtx. But they think things have changed now and he did not want to come to the hospital this time. He is refusing his medical care including Iv abtx  in the hospital based on chart review. Per daughter, he is not eating as well. She is also aware that his kidneys are failing now and family has opted out of HD per patient's wish. Patient is being followed by Palliative team. Family has opted to go home with hospice based on my conversation and per documentation by other providers in the chart.   ID will sign off for now. Please call with questions  [name], MD Infectious Disease Physician [facility] [address] [place], [place] [address] Phone: [phone]  Fax: [phone]

## 2020-08-30 NOTE — Plan of Care (Signed)
Pt nonverbal. Opens eyes to touch due to deaf. Daughter at bedside attempted eye/ear drops pt became agitated. Heparin continues to run. Plan is for pt to go home with hospice and RN spoke with patient regarding care.  Problem: Elimination: Goal: Will not experience complications related to bowel motility Outcome: Progressing Goal: Will not experience complications related to urinary retention Outcome: Progressing   Problem: Pain Managment: Goal: General experience of comfort will improve Outcome: Progressing   Problem: Safety: Goal: Ability to remain free from injury will improve Outcome: Progressing   Problem: Skin Integrity: Goal: Risk for impaired skin integrity will decrease Outcome: Progressing

## 2020-08-30 NOTE — Progress Notes (Signed)
[facility] Progress Note  Name: Domenico Achord MRN: [id number] DOB: [birth year]   Subjective:  He is going to be going home with hospice. Spoke with his daughter at bedside.  They are going to try to use pictures to communicate.  He had trouble reading the white board at one point in the ER - they had previously used that to communicate given that she is deaf.    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 Rogers Blocker for eval and recs re: AKI.  Pt's history is obtained via chart review and history provided by his dtr.    Briefly, pt started having bilateral ear issues since August 2021- was living in [place] and was placed on multiple courses of antibiotics really without relief.  He was not doing well by himself in [place] and so was brought to [place] to be closer to family.  Started following with ENT April 2022--> ear culture 05/30/20 with fluoroquinolone- resistant Pseudomonas aeruginosa.  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  Admitted 5/30-06/24/20 for headache and n/v.  Was found to have skull base osteomyelitis along with C1-C2 osteo as well.  He was placed on cefepime 2 g IV q 8 hrs which he has been receiving at home.  He was initially doing well with this but started developing n/v and confusion since 08/24/20.  Dtr says that he vomited 2x each day but was able to overall keep down at least 32-64 oz water daily.  No NSAIDs.  No known hypotensive events.  Did receive contrast today.  He was brought to the ED where he was found to have a Cr of 4.76 and K of 7.3. He received treatment for hyperkalemia with improvement to 3.8. He is still urinating.  Last Cr known was from OPAT labs- Cr 0.79 07/04/20.  He was seen by ID 08/09/20 where it was noted  that his imaging was improving but not resolved so antibiotics were extended to end date of 09/10/20.  He is taking metformin and lisinopril 10 mg daily.   Intake/Output Summary (Last 24 hours) at 08/30/2020 1216 Last data filed at 08/30/2020 1054 Gross per 24 hour  Intake 213.9 ml  Output 25 ml  Net 188.9 ml    Vitals:  Vitals:   08/29/20 1805 08/29/20 1843 08/29/20 2019 08/30/20 0620  BP: (!) 162/125 (!) 154/131 (!) 173/89 (!) 187/88  Pulse: 69 (!) 57 66 74  Resp: 18  17 17   Temp:   98.3 F (36.8 C) 98.3 F (36.8 C)  TempSrc:      SpO2:      Weight:   78.9 kg   Height:         Physical Exam:  General elderly male in bed in NAD HEENT normocephalic atraumatic tracks visually Neck trachea midline Lungs clear to auscultation bilaterally normal work of breathing at rest  Heart unable to auscultate 2/2 agitation Abdomen soft nontender nondistended Extremities no edema  Neuro nonverbal for me and report of hard of hearing  Medications reviewed   Labs:  BMP Latest Ref Rng & Units 08/30/2020 08/29/2020 08/28/2020  Glucose 70 - 99 mg/dL 66(L) 82 95  BUN 8 - 23 mg/dL 88(H) 88(H) 81(H)  Creatinine 0.61 - 1.24 mg/dL 7.89(H) 7.21(H) 5.62(H)  Sodium 135 - 145 mmol/L 140 135 132(L)  Potassium 3.5 - 5.1 mmol/L 3.7 3.6 3.5  Chloride 98 - 111 mmol/L 106 105 97(L)  CO2 22 - 32 mmol/L 14(L) 14(L) 17(L)  Calcium 8.9 - 10.3 mg/dL 8.1(L) 8.0(L) 8.9     Assessment/Plan:    AKI/ hyperkalemia: hyperkalemia treated effectively with Lokelma, insulin/ dextrose, Lasix, calcium gluconate.  Renal US with no hydro or obstruction.  Concerns include some not-captured hypotensive events resulting in pre-renal injury/ ATN, AIN d/t cephalosporin use.  Now also s/p contrast exposure in setting of AKI.  He was also hypertensive.  CK ok.  Up/cr ratio 3870 mg/g and UA with 100 mg/dl protein and UA with 6-10 RBC.  Cr 0.79 07/04/20 for reference.  Complement ok. ANA and anti-GBM negative.  ANCA pending - the  patient would not ever want dialysis  - agree with transition to comfort measures  2. Proteinuria may be secondary to DM. Note work-up for AKI as above as well   3.  Acute encephalopathy: infectious vs metabolic per neuro--> cultures ordered, neuro was consult.  May have neurotoxicity from cefepime  4. Osteomyelitis of the skull base: ID follows as OP--> team has consulted ID.  Abx as align with goals   5.  HTN: on clonidine patch as not taking PO  6.  DM II hold metformin.  A1c is 8.4  7.  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  Appreciate team and floor unit support of patient and his family  Claudia Desanctis, MD 08/30/2020 12:30 PM

## 2020-08-30 NOTE — Care Management (Addendum)
Spoke to daughter Lorina Rabon this  morning via phone. DME is to arrive around noon.   [name] will call NCM when DME in place.   Will place PTAR paperwork on chart. Once DME in place and nurse ready for discharge will call PTAR.  1300 have not heard back from [facility] regarding DME. Melissa with [facility] seeing if they can have admission nurse see him today. PTAR paperwork placed on chart.   1309 DME in place per Melissa with [facility]. However, family requesting [facility] admission nurse see patient at home on day of discharge and they can confirm a nurse will be available tonight.

## 2020-08-31 DIAGNOSIS — Z515 Encounter for palliative care: Secondary | ICD-10-CM

## 2020-08-31 NOTE — TOC Transition Note (Signed)
Transition of Care [facility]) - CM/SW Discharge Note   Patient Details  Name: Nicolas James MRN: [id number] Date of Birth: 1933/08/10  [facility] of [facility]) CM/SW Contact:  Erin Sons, LCSW Phone Number: 08/31/2020, 9:41 AM   Clinical Narrative:     CSW scheduled PTAR pick up at 1030a for pt to go home with hospice. Daughter notified.   Final next level of care: Home w Hospice Care Barriers to Discharge: No Barriers Identified   Patient Goals and CMS Choice        Discharge Placement                  Name of family member notified: Alberteen Spindle (Daughter)   [phone] (Mobile) Patient and family notified of of transfer: 08/31/20  Discharge Plan and Services                                     Social Determinants of Health (SDOH) Interventions     Readmission Risk Interventions No flowsheet data found.

## 2020-08-31 NOTE — Care Management Important Message (Signed)
Important Message  Patient Details  Name: Nicolas James MRN: [id number] Date of Birth: 03/21/1933   Medicare Important Message Given:  Yes  Patient left prior to IM delivery IM will  be mailed to the patient home address.     Karine Garn 08/31/2020, 3:31 PM

## 2020-08-31 NOTE — Progress Notes (Signed)
Patient ID: Nicolas James, male   DOB: 1933/06/24, 87 y.o.   MRN: [id number] Patient is waiting to be discharged to home with hospice once arrangements have been made.  Overall prognosis is very poor.  Please refer to the full discharge summary done by me on 08/30/2020 for full details.

## 2020-08-31 NOTE — Plan of Care (Signed)
Discharge plans and instructions reviewed with pts daughter at bedside. Plan for discharge via [facility] to home with hospice. PICC to be removed  prior to discharge. Pt safety maintained.  Problem: Elimination: Goal: Will not experience complications related to bowel motility 08/31/2020 1004 by Junius Finner, RN Outcome: Adequate for Discharge 08/31/2020 1003 by Junius Finner, RN Outcome: Adequate for Discharge Goal: Will not experience complications related to urinary retention 08/31/2020 1004 by Junius Finner, RN Outcome: Adequate for Discharge 08/31/2020 1003 by Junius Finner, RN Outcome: Adequate for Discharge   Problem: Pain Managment: Goal: General experience of comfort will improve 08/31/2020 1004 by Junius Finner, RN Outcome: Adequate for Discharge 08/31/2020 1003 by Junius Finner, RN Outcome: Adequate for Discharge   Problem: Safety: Goal: Ability to remain free from injury will improve 08/31/2020 1004 by Junius Finner, RN Outcome: Adequate for Discharge 08/31/2020 1003 by Junius Finner, RN Outcome: Adequate for Discharge   Problem: Skin Integrity: Goal: Risk for impaired skin integrity will decrease 08/31/2020 1004 by Junius Finner, RN Outcome: Adequate for Discharge 08/31/2020 1003 by Junius Finner, RN Outcome: Adequate for Discharge

## 2020-09-01 LAB — CULTURE, BLOOD (ROUTINE X 2)
Culture: NO GROWTH
Culture: NO GROWTH
Special Requests: ADEQUATE
Special Requests: ADEQUATE

## 2020-09-12 ENCOUNTER — Ambulatory Visit: Payer: Medicare Other | Admitting: Infectious Diseases

## 2020-09-20 DEATH — deceased

## 2020-10-02 ENCOUNTER — Ambulatory Visit: Payer: Medicare Other | Admitting: Diagnostic Neuroimaging
